# Patient Record
Sex: Female | Born: 1955 | Race: White | Hispanic: No | Smoking: Never smoker
Health system: Southern US, Community
[De-identification: ages and names within clinical notes are randomized; demographics above are authoritative.]

## PROBLEM LIST (undated history)

## (undated) DIAGNOSIS — M25551 Pain in right hip: Secondary | ICD-10-CM

## (undated) DIAGNOSIS — F1010 Alcohol abuse, uncomplicated: Secondary | ICD-10-CM

## (undated) DIAGNOSIS — I639 Cerebral infarction, unspecified: Secondary | ICD-10-CM

## (undated) DIAGNOSIS — M6250 Muscle wasting and atrophy, not elsewhere classified, unspecified site: Secondary | ICD-10-CM

## (undated) DIAGNOSIS — M869 Osteomyelitis, unspecified: Secondary | ICD-10-CM

## (undated) DIAGNOSIS — F0150 Vascular dementia without behavioral disturbance: Secondary | ICD-10-CM

## (undated) DIAGNOSIS — R262 Difficulty in walking, not elsewhere classified: Secondary | ICD-10-CM

## (undated) DIAGNOSIS — I10 Essential (primary) hypertension: Secondary | ICD-10-CM

## (undated) DIAGNOSIS — M069 Rheumatoid arthritis, unspecified: Secondary | ICD-10-CM

## (undated) DIAGNOSIS — L4050 Arthropathic psoriasis, unspecified: Secondary | ICD-10-CM

## (undated) DIAGNOSIS — E785 Hyperlipidemia, unspecified: Secondary | ICD-10-CM

## (undated) DIAGNOSIS — F0390 Unspecified dementia without behavioral disturbance: Secondary | ICD-10-CM

## (undated) DIAGNOSIS — F0280 Dementia in other diseases classified elsewhere without behavioral disturbance: Secondary | ICD-10-CM

## (undated) DIAGNOSIS — G8929 Other chronic pain: Secondary | ICD-10-CM

## (undated) DIAGNOSIS — G309 Alzheimer's disease, unspecified: Secondary | ICD-10-CM

## (undated) DIAGNOSIS — I7300 Raynaud's syndrome without gangrene: Secondary | ICD-10-CM

## (undated) HISTORY — PX: SPINE SURGERY: SHX786

## (undated) HISTORY — PX: JOINT REPLACEMENT: SHX530

---

## 2015-02-15 ENCOUNTER — Ambulatory Visit
Admission: RE | Admit: 2015-02-15 | Discharge: 2015-02-15 | Disposition: A | Discharge status: Home / Self Care | Payer: Medicare Other | Source: Ambulatory Visit | Attending: Nurse Practitioner | Admitting: Nurse Practitioner

## 2015-02-15 ENCOUNTER — Other Ambulatory Visit: Payer: Self-pay | Admitting: Nurse Practitioner

## 2015-02-15 DIAGNOSIS — M25551 Pain in right hip: Secondary | ICD-10-CM

## 2015-02-15 DIAGNOSIS — Z96642 Presence of left artificial hip joint: Secondary | ICD-10-CM | POA: Diagnosis not present

## 2015-02-15 DIAGNOSIS — I739 Peripheral vascular disease, unspecified: Secondary | ICD-10-CM | POA: Diagnosis not present

## 2015-02-15 DIAGNOSIS — M8580 Other specified disorders of bone density and structure, unspecified site: Secondary | ICD-10-CM | POA: Insufficient documentation

## 2015-06-08 ENCOUNTER — Inpatient Hospital Stay
Admission: EM | Admit: 2015-06-08 | Discharge: 2015-06-09 | DRG: 690 | Disposition: A | Discharge status: Home / Self Care | Payer: Commercial Managed Care - HMO | Attending: Internal Medicine | Admitting: Internal Medicine

## 2015-06-08 ENCOUNTER — Emergency Department: Payer: Commercial Managed Care - HMO

## 2015-06-08 ENCOUNTER — Inpatient Hospital Stay
Admit: 2015-06-08 | Discharge: 2015-06-08 | Disposition: A | Discharge status: Home / Self Care | Payer: Commercial Managed Care - HMO | Attending: Internal Medicine | Admitting: Internal Medicine

## 2015-06-08 ENCOUNTER — Encounter: Payer: Self-pay | Admitting: Internal Medicine

## 2015-06-08 ENCOUNTER — Inpatient Hospital Stay: Payer: Commercial Managed Care - HMO

## 2015-06-08 DIAGNOSIS — Z79899 Other long term (current) drug therapy: Secondary | ICD-10-CM

## 2015-06-08 DIAGNOSIS — Z8673 Personal history of transient ischemic attack (TIA), and cerebral infarction without residual deficits: Secondary | ICD-10-CM

## 2015-06-08 DIAGNOSIS — Z881 Allergy status to other antibiotic agents status: Secondary | ICD-10-CM

## 2015-06-08 DIAGNOSIS — E860 Dehydration: Secondary | ICD-10-CM | POA: Diagnosis present

## 2015-06-08 DIAGNOSIS — Z833 Family history of diabetes mellitus: Secondary | ICD-10-CM

## 2015-06-08 DIAGNOSIS — I10 Essential (primary) hypertension: Secondary | ICD-10-CM | POA: Diagnosis present

## 2015-06-08 DIAGNOSIS — N390 Urinary tract infection, site not specified: Secondary | ICD-10-CM | POA: Diagnosis present

## 2015-06-08 DIAGNOSIS — E871 Hypo-osmolality and hyponatremia: Secondary | ICD-10-CM | POA: Diagnosis present

## 2015-06-08 DIAGNOSIS — I639 Cerebral infarction, unspecified: Secondary | ICD-10-CM | POA: Diagnosis present

## 2015-06-08 DIAGNOSIS — F1010 Alcohol abuse, uncomplicated: Secondary | ICD-10-CM | POA: Diagnosis present

## 2015-06-08 DIAGNOSIS — G459 Transient cerebral ischemic attack, unspecified: Secondary | ICD-10-CM

## 2015-06-08 DIAGNOSIS — M069 Rheumatoid arthritis, unspecified: Secondary | ICD-10-CM | POA: Diagnosis present

## 2015-06-08 DIAGNOSIS — F0390 Unspecified dementia without behavioral disturbance: Secondary | ICD-10-CM | POA: Diagnosis present

## 2015-06-08 DIAGNOSIS — Z8249 Family history of ischemic heart disease and other diseases of the circulatory system: Secondary | ICD-10-CM

## 2015-06-08 HISTORY — DX: Unspecified dementia, unspecified severity, without behavioral disturbance, psychotic disturbance, mood disturbance, and anxiety: F03.90

## 2015-06-08 HISTORY — DX: Rheumatoid arthritis, unspecified: M06.9

## 2015-06-08 HISTORY — DX: Essential (primary) hypertension: I10

## 2015-06-08 LAB — URINALYSIS COMPLETE WITH MICROSCOPIC (ARMC ONLY)
Bilirubin Urine: NEGATIVE
Glucose, UA: NEGATIVE mg/dL
HGB URINE DIPSTICK: NEGATIVE
KETONES UR: NEGATIVE mg/dL
Nitrite: NEGATIVE
PH: 5 (ref 5.0–8.0)
PROTEIN: NEGATIVE mg/dL
SPECIFIC GRAVITY, URINE: 1.008 (ref 1.005–1.030)

## 2015-06-08 LAB — COMPREHENSIVE METABOLIC PANEL
ALBUMIN: 3.7 g/dL (ref 3.5–5.0)
ALT: 13 U/L — ABNORMAL LOW (ref 14–54)
AST: 27 U/L (ref 15–41)
Alkaline Phosphatase: 102 U/L (ref 38–126)
Anion gap: 10 (ref 5–15)
BILIRUBIN TOTAL: 0.6 mg/dL (ref 0.3–1.2)
BUN: 23 mg/dL — AB (ref 6–20)
CHLORIDE: 98 mmol/L — AB (ref 101–111)
CO2: 26 mmol/L (ref 22–32)
Calcium: 9.1 mg/dL (ref 8.9–10.3)
Creatinine, Ser: 1.01 mg/dL — ABNORMAL HIGH (ref 0.44–1.00)
GFR calc Af Amer: >60 mL/min (ref >60)
GFR calc non Af Amer: 59 mL/min — ABNORMAL LOW (ref >60)
GLUCOSE: 108 mg/dL — AB (ref 65–99)
POTASSIUM: 4.5 mmol/L (ref 3.5–5.1)
Sodium: 134 mmol/L — ABNORMAL LOW (ref 135–145)
Total Protein: 7.9 g/dL (ref 6.5–8.1)

## 2015-06-08 LAB — CBC WITH DIFFERENTIAL/PLATELET
BASOS ABS: 0.1 10*3/uL (ref 0–0.1)
Eosinophils Absolute: 0 10*3/uL (ref 0–0.7)
Eosinophils Relative: 1 %
HEMATOCRIT: 34.8 % — AB (ref 35.0–47.0)
Hemoglobin: 11.9 g/dL — ABNORMAL LOW (ref 12.0–16.0)
Lymphocytes Relative: 10 %
Lymphs Abs: 0.8 10*3/uL — ABNORMAL LOW (ref 1.0–3.6)
MCH: 30.9 pg (ref 26.0–34.0)
MCHC: 34.3 g/dL (ref 32.0–36.0)
MCV: 90.2 fL (ref 80.0–100.0)
MONO ABS: 0.7 10*3/uL (ref 0.2–0.9)
Monocytes Relative: 8 %
NEUTROS ABS: 6.7 10*3/uL — AB (ref 1.4–6.5)
Platelets: 272 10*3/uL (ref 150–440)
RBC: 3.85 MIL/uL (ref 3.80–5.20)
RDW: 13.1 % (ref 11.5–14.5)
WBC: 8.3 10*3/uL (ref 3.6–11.0)

## 2015-06-08 LAB — PROTIME-INR
INR: 1.11
Prothrombin Time: 14.5 seconds (ref 11.4–15.0)

## 2015-06-08 LAB — APTT: APTT: 32 s (ref 24–36)

## 2015-06-08 LAB — TROPONIN I: Troponin I: <0.03 ng/mL (ref <0.031)

## 2015-06-08 MED ORDER — ENOXAPARIN SODIUM 40 MG/0.4ML ~~LOC~~ SOLN
40.0000 mg | SUBCUTANEOUS | Status: DC
  Administered 2015-06-08: 40 mg via SUBCUTANEOUS
  Filled 2015-06-08: qty 0.4

## 2015-06-08 MED ORDER — LORAZEPAM 1 MG PO TABS: 1.0000 mg | ORAL_TABLET | Freq: Four times a day (QID) | ORAL | Status: DC | PRN

## 2015-06-08 MED ORDER — METOPROLOL TARTRATE 25 MG PO TABS
12.5000 mg | ORAL_TABLET | Freq: Two times a day (BID) | ORAL | Status: DC
  Administered 2015-06-08 – 2015-06-09 (×3): 12.5 mg via ORAL
  Filled 2015-06-08 (×3): qty 1

## 2015-06-08 MED ORDER — OXYCODONE-ACETAMINOPHEN 5-325 MG PO TABS: 1.0000 | ORAL_TABLET | Freq: Every day | ORAL | Status: DC | PRN

## 2015-06-08 MED ORDER — SENNOSIDES-DOCUSATE SODIUM 8.6-50 MG PO TABS: 1.0000 | ORAL_TABLET | Freq: Every evening | ORAL | Status: DC | PRN

## 2015-06-08 MED ORDER — LORAZEPAM 2 MG/ML IJ SOLN: 1.0000 mg | Freq: Four times a day (QID) | INTRAMUSCULAR | Status: DC | PRN

## 2015-06-08 MED ORDER — ASPIRIN 300 MG RE SUPP: 300.0000 mg | Freq: Every day | RECTAL | Status: DC

## 2015-06-08 MED ORDER — DONEPEZIL HCL 5 MG PO TABS
10.0000 mg | ORAL_TABLET | Freq: Every day | ORAL | Status: DC
  Administered 2015-06-08: 10 mg via ORAL
  Filled 2015-06-08: qty 2

## 2015-06-08 MED ORDER — LORAZEPAM 2 MG PO TABS
0.0000 mg | ORAL_TABLET | Freq: Four times a day (QID) | ORAL | Status: DC
  Administered 2015-06-09: 09:00:00 2 mg via ORAL
  Filled 2015-06-08: qty 1

## 2015-06-08 MED ORDER — FOLIC ACID 1 MG PO TABS: 1.0000 mg | ORAL_TABLET | Freq: Every day | ORAL | Status: DC

## 2015-06-08 MED ORDER — VITAMIN B-1 100 MG PO TABS
100.0000 mg | ORAL_TABLET | Freq: Every day | ORAL | Status: DC
Start: 2015-06-08 — End: 2015-06-09
  Administered 2015-06-08 – 2015-06-09 (×2): 100 mg via ORAL
  Filled 2015-06-08 (×2): qty 1

## 2015-06-08 MED ORDER — THIAMINE HCL 100 MG/ML IJ SOLN
100.0000 mg | Freq: Every day | INTRAMUSCULAR | Status: DC
Start: 2015-06-08 — End: 2015-06-09
  Filled 2015-06-08 (×2): qty 1

## 2015-06-08 MED ORDER — CEFTRIAXONE SODIUM 1 G IJ SOLR
1.0000 g | Freq: Once | INTRAMUSCULAR | Status: AC
  Administered 2015-06-08: 1 g via INTRAVENOUS
  Filled 2015-06-08: qty 10

## 2015-06-08 MED ORDER — DEXTROSE 5 % IV SOLN
1.0000 g | INTRAVENOUS | Status: DC
  Filled 2015-06-08: qty 10

## 2015-06-08 MED ORDER — ASPIRIN 325 MG PO TABS
325.0000 mg | ORAL_TABLET | Freq: Every day | ORAL | Status: DC
  Administered 2015-06-09: 325 mg via ORAL
  Filled 2015-06-08: qty 1

## 2015-06-08 MED ORDER — LORAZEPAM 2 MG PO TABS: 0.0000 mg | ORAL_TABLET | Freq: Two times a day (BID) | ORAL | Status: DC

## 2015-06-08 MED ORDER — STROKE: EARLY STAGES OF RECOVERY BOOK
Freq: Once | Status: AC
  Administered 2015-06-08: 17:00:00

## 2015-06-08 MED ORDER — ATORVASTATIN CALCIUM 20 MG PO TABS
40.0000 mg | ORAL_TABLET | Freq: Every day | ORAL | Status: DC
  Administered 2015-06-08: 40 mg via ORAL
  Filled 2015-06-08: qty 2

## 2015-06-08 MED ORDER — SODIUM CHLORIDE 0.9 % IV SOLN
INTRAVENOUS | Status: DC
  Administered 2015-06-08: 18:00:00 via INTRAVENOUS

## 2015-06-08 MED ORDER — ADULT MULTIVITAMIN W/MINERALS CH
1.0000 | ORAL_TABLET | Freq: Every day | ORAL | Status: DC
  Administered 2015-06-08 – 2015-06-09 (×2): 1 via ORAL
  Filled 2015-06-08 (×2): qty 1

## 2015-06-08 MED ORDER — FOLIC ACID 1 MG PO TABS
1.0000 mg | ORAL_TABLET | Freq: Every day | ORAL | Status: DC
  Administered 2015-06-08 – 2015-06-09 (×2): 1 mg via ORAL
  Filled 2015-06-08 (×2): qty 1

## 2015-06-08 MED ORDER — ASPIRIN 81 MG PO CHEW
324.0000 mg | CHEWABLE_TABLET | Freq: Once | ORAL | Status: AC
  Administered 2015-06-08: 324 mg via ORAL
  Filled 2015-06-08: qty 4

## 2015-06-08 NOTE — ED Provider Notes (Signed)
[facility] Emergency Department Provider Note   ____________________________________________  Time seen: Approximately 12:43 PM  I have reviewed the triage vital signs and the nursing notes.   HISTORY  Chief Complaint Weakness    HPI Ruth Price is a 60 y.o. female history of psoriatic arthritis, history of hypertension, CVA, prior head injury who presents for evaluation of transient confusion with word finding difficulty today, gradual onset, now resolved, no modifying factors. The patient reports that she was doing some work with her checkbook just prior to arrival when suddenly she became unable to perform additional or subtraction, she also had word finding difficulty. This lasted approximately 7 minutes before it resolved. No chest pain difficulty breathing, no abdominal pain, no vomiting, diarrhea, fevers or chills but she denies any associated numbness or weakness. She denies any dysuria, hematuria, or increased urinary frequent.   Past Medical History  Diagnosis Date  . RA (rheumatoid arthritis) (HCC)   . Hypertension   . Dementia     Patient Active Problem List   Diagnosis Date Noted  . CVA (cerebral infarction) 06/08/2015    Past Surgical History  Procedure Laterality Date  . Joint replacement    . Spine surgery      Current Outpatient Rx  Name  Route  Sig  Dispense  Refill  . atorvastatin (LIPITOR) 40 MG tablet   Oral   Take 40 mg by mouth at bedtime.         . B Complex-C (SUPER B COMPLEX PO)   Oral   Take 1 capsule by mouth daily.         . Biotin 1000 MCG tablet   Oral   Take 1,000 mcg by mouth daily.         . calcium carbonate (OSCAL) 1500 (600 Ca) MG TABS tablet   Oral   Take 600 mg of elemental calcium by mouth daily with breakfast.         . cyclobenzaprine (FLEXERIL) 10 MG tablet   Oral   Take 10 mg by mouth 3 (three) times daily as needed for muscle spasms.         Marland Kitchen donepezil (ARICEPT) 10 MG  tablet   Oral   Take 10 mg by mouth at bedtime.         . folic acid (FOLVITE) 1 MG tablet   Oral   Take 1 mg by mouth daily.         . Glucosamine-Chondroitin-MSM 750-400-375 MG TABS   Oral   Take 1 tablet by mouth daily.         . Multiple Vitamin (MULTIVITAMIN WITH MINERALS) TABS tablet   Oral   Take 1 tablet by mouth daily.         . naproxen sodium (ANAPROX) 220 MG tablet   Oral   Take 440 mg by mouth daily with breakfast.         . omega-3 acid ethyl esters (LOVAZA) 1 g capsule   Oral   Take 2 g by mouth daily.         Marland Kitchen oxyCODONE-acetaminophen (PERCOCET/ROXICET) 5-325 MG tablet   Oral   Take 1 tablet by mouth daily as needed for severe pain.           Allergies Levofloxacin  Family History  Problem Relation Age of Onset  . Diabetes Mother   . Heart disease Father     Social History Social History  Substance Use Topics  . Smoking  status: Not on file  . Smokeless tobacco: Not on file  . Alcohol Use: Not on file    Review of Systems Constitutional: No fever/chills Eyes: No visual changes. ENT: No sore throat. Cardiovascular: Denies chest pain. Respiratory: Denies shortness of breath. Gastrointestinal: No abdominal pain.  No nausea, no vomiting.  No diarrhea.  No constipation. Genitourinary: Negative for dysuria. Musculoskeletal: Negative for back pain. Skin: Negative for rash. Neurological: Negative for headaches, focal weakness or numbness.  10-point ROS otherwise negative.  ____________________________________________   PHYSICAL EXAM:  Filed Vitals:   06/08/15 1246 06/08/15 1500  BP: 186/68   Pulse: 79   Temp:  98.3 F (36.8 C)  TempSrc:  Oral  Resp: 18   Weight: 101 lb 11.2 oz (46.131 kg)   SpO2: 95%      Constitutional: Alert and oriented. Well appearing and in no acute distress. Eyes: Conjunctivae are normal. PERRL. EOMI. Head: Atraumatic. Nose: No congestion/rhinnorhea. Mouth/Throat: Mucous membranes are moist.   Oropharynx non-erythematous. Neck: No stridor. Supple without meningismus. Cardiovascular: Normal rate, regular rhythm. Grossly normal heart sounds.  Good peripheral circulation. Respiratory: Normal respiratory effort.  No retractions. Lungs CTAB. Gastrointestinal: Soft and nontender. No distention.  No CVA tenderness. Genitourinary: deferred Musculoskeletal: No lower extremity tenderness nor edema.  No joint effusions. Chronic contractures with muscle wasting and deformities of the hand secondary to psoriatic arthritis. Neurologic:  Normal speech and language. No gross focal neurologic deficits are appreciated. 5 out of 5 strength bilateral upper and lower extremity, sensation intact to light touch throughout, cranial nerves II through XII intact. Skin:  Skin is warm, dry and intact. No rash noted. Psychiatric: Mood and affect are normal. Speech and behavior are normal.  ____________________________________________   LABS (all labs ordered are listed, but only abnormal results are displayed)  Labs Reviewed  CBC WITH DIFFERENTIAL/PLATELET - Abnormal; Notable for the following:    Hemoglobin 11.9 (*)    HCT 34.8 (*)    Neutro Abs 6.7 (*)    Lymphs Abs 0.8 (*)    All other components within normal limits  COMPREHENSIVE METABOLIC PANEL - Abnormal; Notable for the following:    Sodium 134 (*)    Chloride 98 (*)    Glucose, Bld 108 (*)    BUN 23 (*)    Creatinine, Ser 1.01 (*)    ALT 13 (*)    GFR calc non Af Amer 59 (*)    All other components within normal limits  URINALYSIS COMPLETEWITH MICROSCOPIC ([facility] ONLY) - Abnormal; Notable for the following:    Color, Urine YELLOW (*)    APPearance CLEAR (*)    Leukocytes, UA 2+ (*)    Bacteria, UA RARE (*)    Squamous Epithelial / LPF 6-30 (*)    All other components within normal limits  CULTURE, BLOOD (ROUTINE X 2)  CULTURE, BLOOD (ROUTINE X 2)  URINE CULTURE  TROPONIN I  PROTIME-INR  APTT    ____________________________________________  EKG  ED ECG REPORT I, Gayla Doss, the attending physician, personally viewed and interpreted this ECG.   Date: 06/08/2015  EKG Time: 13:37  Rate: 68  Rhythm: normal sinus rhythm  Axis: normal  Intervals:none  ST&T Change: No acute ST elevation, RSR prime likely normal variant.  ____________________________________________  RADIOLOGY  CT head IMPRESSION: Subacute to chronic right posterior parietal and occipital infarct. Favor chronic.  Chronic small vessel disease throughout the deep white matter.  CXR  IMPRESSION: No active cardiopulmonary disease.  ____________________________________________  PROCEDURES  Procedure(s) performed: None  Critical Care performed: No  ____________________________________________   INITIAL IMPRESSION / ASSESSMENT AND PLAN / ED COURSE  Pertinent labs & imaging results that were available during my care of the patient were reviewed by me and considered in my medical decision making (see chart for details).  Ruth Price is a 60 y.o. female history of psoriatic arthritis, history of hypertension, CVA, prior head injury who presents for evaluation of transient confusion with word finding difficulty today, gradual onset, now resolved. On exam, she is well-appearing and in no acute distress. Her vital signs are stable, she is afebrile. Currently, she has an intact neurological examination. NIH stroke scale of 0, not a candidate for TPA. My concern is for possible TIA. We'll obtain screening labs, CT head, chest x-ray, urinalysis and anticipate admission.  ----------------------------------------- 2:26 PM on 06/08/2015 ----------------------------------------- Labs reviewed. CBC and CMP are generally unremarkable. Negative troponin. CT head shows subacute to chronic infarcts. Chest x-ray clear. Urinalysis is concerning for urinary tract infection, we'll give ceftriaxone. Suspect this  may have represented a TIA versus transient confusion secondary to urinary tract infection. Aspirin ordered.Case discussed with hospitalist, Dr. Claudie Fisherman, for admission ____________________________________________   FINAL CLINICAL IMPRESSION(S) / ED DIAGNOSES  Final diagnoses:  Transient cerebral ischemia, unspecified transient cerebral ischemia type  UTI (lower urinary tract infection)      NEW MEDICATIONS STARTED DURING THIS VISIT:  New Prescriptions   No medications on file     Note:  This document was prepared using Dragon voice recognition software and may include unintentional dictation errors.    Gayla Doss, MD 06/08/15 [phone]

## 2015-06-08 NOTE — ED Notes (Signed)
Patient transported to MRI 

## 2015-06-08 NOTE — Progress Notes (Signed)
*  PRELIMINARY RESULTS* Echocardiogram 2D Echocardiogram has been performed.  Garrel Ridgel Stills 06/08/2015, 7:10 PM

## 2015-06-08 NOTE — H&P (Signed)
[facility] - [place] [facility]   PATIENT NAME: Ruth Price    MR#:  [id number]  DATE OF BIRTH:  07/30/1955  DATE OF ADMISSION:  06/08/2015  PRIMARY CARE PHYSICIAN: Jonathon Bellows, NP   REQUESTING/REFERRING PHYSICIAN: Gayla Doss, MD  CHIEF COMPLAINT:   Chief Complaint  Patient presents with  . Weakness   Confusion with word finding difficulty today. HISTORY OF PRESENT ILLNESS:  Ruth Price  is a 60 y.o. female with a known history of hypertension, RA and dementia. The patient was doing some work with her checkbook just prior to arrival. Patient was unable to talk even she could listen. The patient denies any focal weakness or numbness. She denies any other symptoms. Her CAT scan of the head showed subtle acute CVA or chronic. She was treated with aspirin in the ED.  PAST MEDICAL HISTORY:   Past Medical History  Diagnosis Date  . RA (rheumatoid arthritis) (HCC)   . Hypertension   . Dementia     PAST SURGICAL HISTORY:   Past Surgical History  Procedure Laterality Date  . Joint replacement    . Spine surgery      SOCIAL HISTORY:   Social History  Substance Use Topics  . Smoking status: Never Smoker   . Smokeless tobacco: Not on file  . Alcohol Use: No    FAMILY HISTORY:   Family History  Problem Relation Age of Onset  . Diabetes Mother   . Heart disease Father     DRUG ALLERGIES:   Allergies  Allergen Reactions  . Levofloxacin Other (See Comments)    Reaction:  Unknown     REVIEW OF SYSTEMS:  CONSTITUTIONAL: No fever, fatigue or weakness.  EYES: No blurred or double vision.  EARS, NOSE, AND THROAT: No tinnitus or ear pain.  RESPIRATORY: No cough, shortness of breath, wheezing or hemoptysis.  CARDIOVASCULAR: No chest pain, orthopnea, edema.  GASTROINTESTINAL: No nausea, vomiting, diarrhea or abdominal pain.  GENITOURINARY: No dysuria, hematuria.  ENDOCRINE: No polyuria, nocturia,  HEMATOLOGY: No anemia, easy  bruising or bleeding SKIN: No rash or lesion. MUSCULOSKELETAL: No joint pain or arthritis.   NEUROLOGIC: No tingling, numbness, weakness. Difficulty finding words. PSYCHIATRY: No anxiety or depression.   MEDICATIONS AT HOME:   Prior to Admission medications   Medication Sig Start Date End Date Taking? Authorizing Provider  atorvastatin (LIPITOR) 40 MG tablet Take 40 mg by mouth at bedtime.   Yes Historical Provider, MD  B Complex-C (SUPER B COMPLEX PO) Take 1 capsule by mouth daily.   Yes Historical Provider, MD  Biotin 1000 MCG tablet Take 1,000 mcg by mouth daily.   Yes Historical Provider, MD  calcium carbonate (OSCAL) 1500 (600 Ca) MG TABS tablet Take 600 mg of elemental calcium by mouth daily with breakfast.   Yes Historical Provider, MD  cyclobenzaprine (FLEXERIL) 10 MG tablet Take 10 mg by mouth 3 (three) times daily as needed for muscle spasms.   Yes Historical Provider, MD  donepezil (ARICEPT) 10 MG tablet Take 10 mg by mouth at bedtime.   Yes Historical Provider, MD  folic acid (FOLVITE) 1 MG tablet Take 1 mg by mouth daily.   Yes Historical Provider, MD  Glucosamine-Chondroitin-MSM [phone] MG TABS Take 1 tablet by mouth daily.   Yes Historical Provider, MD  Multiple Vitamin (MULTIVITAMIN WITH MINERALS) TABS tablet Take 1 tablet by mouth daily.   Yes Historical Provider, MD  naproxen sodium (ANAPROX) 220 MG tablet Take 440 mg  by mouth daily with breakfast.   Yes Historical Provider, MD  omega-3 acid ethyl esters (LOVAZA) 1 g capsule Take 2 g by mouth daily.   Yes Historical Provider, MD  oxyCODONE-acetaminophen (PERCOCET/ROXICET) 5-325 MG tablet Take 1 tablet by mouth daily as needed for severe pain.   Yes Historical Provider, MD      VITAL SIGNS:  Blood pressure 186/68, pulse 79, temperature 98.3 F (36.8 C), temperature source Oral, resp. rate 18, weight 46.131 kg (101 lb 11.2 oz), SpO2 95 %.  PHYSICAL EXAMINATION:  GENERAL:  60 y.o.-year-old patient lying in the bed  with no acute distress.  EYES: Pupils equal, round, reactive to light and accommodation. No scleral icterus. Extraocular muscles intact.  HEENT: Head atraumatic, normocephalic. Oropharynx and nasopharynx clear. Hard hearing. NECK:  Supple, no jugular venous distention. No thyroid enlargement, no tenderness.  LUNGS: Normal breath sounds bilaterally, no wheezing, rales,rhonchi or crepitation. No use of accessory muscles of respiration.  CARDIOVASCULAR: S1, S2 normal. No murmurs, rubs, or gallops.  ABDOMEN: Soft, nontender, nondistended. Bowel sounds present. No organomegaly or mass.  EXTREMITIES: No pedal edema, cyanosis, or clubbing.  NEUROLOGIC: Cranial nerves II through XII are intact. Muscle strength 5/5 in all extremities. Sensation intact. Gait not checked.  PSYCHIATRIC: The patient is alert and oriented x 3.  SKIN: No obvious rash, lesion, or ulcer.   LABORATORY PANEL:   CBC  Recent Labs Lab 06/08/15 1249  WBC 8.3  HGB 11.9*  HCT 34.8*  PLT 272   ------------------------------------------------------------------------------------------------------------------  Chemistries   Recent Labs Lab 06/08/15 1249  NA 134*  K 4.5  CL 98*  CO2 26  GLUCOSE 108*  BUN 23*  CREATININE 1.01*  CALCIUM 9.1  AST 27  ALT 13*  ALKPHOS 102  BILITOT 0.6   ------------------------------------------------------------------------------------------------------------------  Cardiac Enzymes  Recent Labs Lab 06/08/15 1249  TROPONINI <0.03   ------------------------------------------------------------------------------------------------------------------  RADIOLOGY:  Dg Chest 2 View  06/08/2015  CLINICAL DATA:  Patient with possible syncopal episode. EXAM: CHEST  2 VIEW COMPARISON:  None. FINDINGS: Normal cardiac and mediastinal contours. No consolidative pulmonary opacities. No pleural effusion or pneumothorax. Anterior cervical spinal fusion hardware. Bilateral shoulder joint  degenerative changes. Thoracic spine degenerative changes. Lateral view limited due to overlapping soft tissue. Apical pleural parenchymal thickening. IMPRESSION: No active cardiopulmonary disease. Electronically Signed   By: Annia Belt M.D.   On: 06/08/2015 13:45   Ct Head Wo Contrast  06/08/2015  CLINICAL DATA:  Possible TIA.  Altered mental status. EXAM: CT HEAD WITHOUT CONTRAST TECHNIQUE: Contiguous axial images were obtained from the base of the skull through the vertex without intravenous contrast. COMPARISON:  None. FINDINGS: Chronic small vessel disease changes throughout the deep white matter. Subacute or old infarcts noted in the right posterior parietal/occipital lobe. No hemorrhage or hydrocephalus. No acute calvarial abnormality. Visualized paranasal sinuses and mastoids clear. Orbital soft tissues unremarkable. 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 Favor chronic. Chronic small vessel disease throughout the deep white matter. Electronically Signed   By: Charlett Nose M.D.   On: 06/08/2015 13:20    EKG:   Orders placed or performed during the hospital encounter of 06/08/15  . ED EKG  . ED EKG    IMPRESSION AND PLAN:   Subacute CVA The patient will be admitted to medical floor. Follow up MRI of the brain, echocardiogram and carotid duplex. Start aspirin and Lipitor. Neuro check, PT, OT and speech study. Fall and aspiration precaution.  Hypertension. Start low-dose  Lopressor, but allow permissive blood pressure.  Dehydration. Start normal saline and follow-up BMP. Mild hyponatremia. Continue normal saline IV and follow-up BMP.   All the records are reviewed and case discussed with ED provider. Management plans discussed with the patient, family and they are in agreement.  CODE STATUS: Full code  TOTAL TIME TAKING CARE OF THIS PATIENT: 52 minutes.    Shaune Pollack M.D on 06/08/2015 at 3:07 PM  Between 7am to 6pm - Pager -  [phone]  After 6pm go to [email or web address] - password [email or web address] [facility]  [facility]  Office  [phone]  CC: Primary care physician; Jonathon Bellows, NP

## 2015-06-08 NOTE — ED Notes (Signed)
Per patient daughter in law they were at home and patient had a period that last 6-7 minutes where her eyes appeared glazed over, patient was talking but not making sense, and patient would not response when daughter in law called her name.  Per daughter in law patient drinks daily, thinks her last drink was last PM. Patient was also diagnosed with Dementia about 7 years ago but will not admit to it.

## 2015-06-08 NOTE — ED Notes (Signed)
Patient from home via [facility]. Patient states she had a feeling of "being in a balloon". Patient's family states she was non-responsive. However, patient is at baseline at current. Stroke scale negative.

## 2015-06-08 NOTE — ED Notes (Signed)
Contact information for her daughter in [place]:   [phone]

## 2015-06-08 NOTE — ED Notes (Signed)
Attempted to call Patient daughter in law to give update on patient. Left message for Amber to call back.

## 2015-06-08 NOTE — ED Notes (Signed)
Patient states that she and her daughter in law were working on her check book and patient was unable to do addition and subtraction. Patient states that she then felt as if she was unable to express her thoughts and she felt as if she were in a bubble. Patient denies LOC during this time and states that she could hear her daughter in law talking to her she was just unable to answer her.

## 2015-06-09 ENCOUNTER — Inpatient Hospital Stay: Payer: Commercial Managed Care - HMO

## 2015-06-09 LAB — BASIC METABOLIC PANEL
Anion gap: 10 (ref 5–15)
BUN: 17 mg/dL (ref 6–20)
CALCIUM: 8.7 mg/dL — AB (ref 8.9–10.3)
CO2: 25 mmol/L (ref 22–32)
CREATININE: 0.86 mg/dL (ref 0.44–1.00)
Chloride: 99 mmol/L — ABNORMAL LOW (ref 101–111)
GFR calc non Af Amer: >60 mL/min (ref >60)
Glucose, Bld: 98 mg/dL (ref 65–99)
Potassium: 3.6 mmol/L (ref 3.5–5.1)
SODIUM: 134 mmol/L — AB (ref 135–145)

## 2015-06-09 LAB — LIPID PANEL
CHOL/HDL RATIO: 3.6 ratio
Cholesterol: 118 mg/dL (ref 0–200)
HDL: 33 mg/dL — AB (ref >40)
LDL Cholesterol: 59 mg/dL (ref 0–99)
Triglycerides: 128 mg/dL (ref <150)
VLDL: 26 mg/dL (ref 0–40)

## 2015-06-09 LAB — HEMOGLOBIN A1C: Hgb A1c MFr Bld: 5.3 % (ref 4.0–6.0)

## 2015-06-09 LAB — ECHOCARDIOGRAM COMPLETE: Weight: 1627.2 oz

## 2015-06-09 MED ORDER — METOPROLOL TARTRATE 25 MG PO TABS: 25.0000 mg | ORAL_TABLET | Freq: Two times a day (BID) | ORAL | Status: DC

## 2015-06-09 MED ORDER — ASPIRIN 81 MG PO CHEW: 81.0000 mg | CHEWABLE_TABLET | Freq: Every day | ORAL | Status: DC

## 2015-06-09 MED ORDER — CEPHALEXIN 500 MG PO CAPS: 500.0000 mg | ORAL_CAPSULE | Freq: Two times a day (BID) | ORAL | Status: DC

## 2015-06-09 NOTE — Progress Notes (Signed)
OT Cancellation Note  Patient Details Name: Ruth Price MRN: [id number] DOB: [birth year]   Cancelled Treatment:     Pt eating lunch - family present - pt do not want therapy at home - she gets assistance for bathing and dressing  Family going to replace her shower chair- she has BSC over toilet  Does simple cooking and family helps  No OT eval done  Oletta Cohn 06/09/2015, 1:13 PM

## 2015-06-09 NOTE — Progress Notes (Signed)
Pt being discharged today, discharge paperwork explained and pt received printed prescriptions. She verified understanding of all instructions. Belongings returned to patient. She is waiting on her ride.

## 2015-06-09 NOTE — Discharge Summary (Signed)
[facility] - [place] at [facility]   PATIENT NAME: Ruth Price    MR#:  [id number]  DATE OF BIRTH:  12/10/1955  DATE OF ADMISSION:  06/08/2015 ADMITTING PHYSICIAN: Shaune Pollack, MD  DATE OF DISCHARGE: 06/09/2015  1:35 PM  PRIMARY CARE PHYSICIAN: Apolonio Schneiders C, NP    ADMISSION DIAGNOSIS:  UTI (lower urinary tract infection) [N39.0] Stroke (cerebrum) (HCC) [I63.9] Transient cerebral ischemia, unspecified transient cerebral ischemia type [G45.9]   DISCHARGE DIAGNOSIS:    SECONDARY DIAGNOSIS:   Past Medical History  Diagnosis Date  . RA (rheumatoid arthritis) (HCC)   . Hypertension   . Dementia     HOSPITAL COURSE:   Word finding difficulty, no acute CVA. Per MRI of the brain, echocardiogram; EF 60%, No cardiac source of emboli was indentified. carotid duplex: No significant carotid stenosis. Started aspirin and Lipitor. PT: no home PT.   UTI. The patient was treated with Rocephin, urine culture showed  GNR, changed to by mouth Keflex.  Hypertension. Started low-dose Lopressor. BP is not well controlled, increase to 25 mg bid.  Dehydration. Improved with normal saline iv. Mild hyponatremia. Treated with normal saline IV.  Alcohol abuse. The patient was advised alcohol cessation gradually.  DISCHARGE CONDITIONS:   Stable, discharged to home today.  CONSULTS OBTAINED:     DRUG ALLERGIES:   Allergies  Allergen Reactions  . Levofloxacin Other (See Comments)    Reaction:  Unknown     DISCHARGE MEDICATIONS:   Discharge Medication List as of 06/09/2015 11:38 AM    START taking these medications   Details  aspirin 81 MG chewable tablet Chew 1 tablet (81 mg total) by mouth daily., Starting 06/09/2015, Until Discontinued, Print    cephALEXin (KEFLEX) 500 MG capsule Take 1 capsule (500 mg total) by mouth 2 (two) times daily., Starting 06/09/2015, Until Discontinued, Print    metoprolol tartrate (LOPRESSOR) 25 MG tablet Take 1 tablet (25 mg  total) by mouth 2 (two) times daily., Starting 06/09/2015, Until Discontinued, Print      CONTINUE these medications which have NOT CHANGED   Details  atorvastatin (LIPITOR) 40 MG tablet Take 40 mg by mouth at bedtime., Until Discontinued, Historical Med    B Complex-C (SUPER B COMPLEX PO) Take 1 capsule by mouth daily., Until Discontinued, Historical Med    Biotin 1000 MCG tablet Take 1,000 mcg by mouth daily., Until Discontinued, Historical Med    calcium carbonate (OSCAL) 1500 (600 Ca) MG TABS tablet Take 600 mg of elemental calcium by mouth daily with breakfast., Until Discontinued, Historical Med    donepezil (ARICEPT) 10 MG tablet Take 10 mg by mouth at bedtime., Until Discontinued, Historical Med    folic acid (FOLVITE) 1 MG tablet Take 1 mg by mouth daily., Until Discontinued, Historical Med    Glucosamine-Chondroitin-MSM 750-400-375 MG TABS Take 1 tablet by mouth daily., Until Discontinued, Historical Med    Multiple Vitamin (MULTIVITAMIN WITH MINERALS) TABS tablet Take 1 tablet by mouth daily., Until Discontinued, Historical Med    naproxen sodium (ANAPROX) 220 MG tablet Take 440 mg by mouth daily with breakfast., Until Discontinued, Historical Med    omega-3 acid ethyl esters (LOVAZA) 1 g capsule Take 2 g by mouth daily., Until Discontinued, Historical Med    oxyCODONE-acetaminophen (PERCOCET/ROXICET) 5-325 MG tablet Take 1 tablet by mouth daily as needed for severe pain., Until Discontinued, Historical Med         DISCHARGE INSTRUCTIONS:   If you experience worsening of your admission  symptoms, develop shortness of breath, life threatening emergency, suicidal or homicidal thoughts you must seek medical attention immediately by calling 911 or calling your MD immediately  if symptoms less severe.  You Must read complete instructions/literature along with all the possible adverse reactions/side effects for all the Medicines you take and that have been prescribed to you. Take  any new Medicines after you have completely understood and accept all the possible adverse reactions/side effects.   Please note  You were cared for by a hospitalist during your hospital stay. If you have any questions about your discharge medications or the care you received while you were in the hospital after you are discharged, you can call the unit and asked to speak with the hospitalist on call if the hospitalist that took care of you is not available. Once you are discharged, your primary care physician will handle any further medical issues. Please note that NO REFILLS for any discharge medications will be authorized once you are discharged, as it is imperative that you return to your primary care physician (or establish a relationship with a primary care physician if you do not have one) for your aftercare needs so that they can reassess your need for medications and monitor your lab values.    Today   SUBJECTIVE   No complaint.   VITAL SIGNS:  Blood pressure 171/81, pulse 70, temperature 98.3 F (36.8 C), temperature source Oral, resp. rate 18, height 5\' 2"  (1.575 m), weight 46.131 kg (101 lb 11.2 oz), SpO2 98 %.  I/O:  No intake or output data in the 24 hours ending 06/09/15 1532  PHYSICAL EXAMINATION:  GENERAL:  60 y.o.-year-old patient lying in the bed with no acute distress.  EYES: Pupils equal, round, reactive to light and accommodation. No scleral icterus. Extraocular muscles intact.  HEENT: Head atraumatic, normocephalic. Oropharynx and nasopharynx clear. Heard hearing. NECK:  Supple, no jugular venous distention. No thyroid enlargement, no tenderness.  LUNGS: Normal breath sounds bilaterally, no wheezing, rales,rhonchi or crepitation. No use of accessory muscles of respiration.  CARDIOVASCULAR: S1, S2 normal. No murmurs, rubs, or gallops.  ABDOMEN: Soft, non-tender, non-distended. Bowel sounds present. No organomegaly or mass.  EXTREMITIES: No pedal edema, cyanosis, or  clubbing.  NEUROLOGIC: Cranial nerves II through XII are intact. Muscle strength 5/5 in all extremities. Sensation intact. Gait not checked.  PSYCHIATRIC: The patient is alert and oriented x 3.  SKIN: No obvious rash, lesion, or ulcer.   DATA REVIEW:   CBC  Recent Labs Lab 06/08/15 1249  WBC 8.3  HGB 11.9*  HCT 34.8*  PLT 272    Chemistries   Recent Labs Lab 06/08/15 1249 06/09/15 0422  NA 134* 134*  K 4.5 3.6  CL 98* 99*  CO2 26 25  GLUCOSE 108* 98  BUN 23* 17  CREATININE 1.01* 0.86  CALCIUM 9.1 8.7*  AST 27  --   ALT 13*  --   ALKPHOS 102  --   BILITOT 0.6  --     Cardiac Enzymes  Recent Labs Lab 06/08/15 1249  TROPONINI <0.03    Microbiology Results  Results for orders placed or performed during the hospital encounter of 06/08/15  Urine culture     Status: Abnormal (Preliminary result)   Collection Time: 06/08/15  1:38 PM  Result Value Ref Range Status   Specimen Description URINE, RANDOM  Final   Special Requests NONE  Final   Culture (A)  Final    >=100,000 COLONIES/mL GRAM NEGATIVE  RODS IDENTIFICATION AND SUSCEPTIBILITIES TO FOLLOW    Report Status PENDING  Incomplete  Blood culture (routine x 2)     Status: None (Preliminary result)   Collection Time: 06/08/15  2:42 PM  Result Value Ref Range Status   Specimen Description BLOOD RIGHT ANTECUBITAL  Final   Special Requests   Final    BOTTLES DRAWN AEROBIC AND ANAEROBIC  AER 3CC ANA 1CC   Culture NO GROWTH < 24 HOURS  Final   Report Status PENDING  Incomplete  Blood culture (routine x 2)     Status: None (Preliminary result)   Collection Time: 06/08/15  2:42 PM  Result Value Ref Range Status   Specimen Description BLOOD RIGHT ARM  Final   Special Requests   Final    BOTTLES DRAWN AEROBIC AND ANAEROBIC  AER 3CC ANA 1CC   Culture NO GROWTH < 24 HOURS  Final   Report Status PENDING  Incomplete    RADIOLOGY:  Dg Chest 2 View  06/08/2015  CLINICAL DATA:  Patient with possible syncopal  episode. EXAM: CHEST  2 VIEW COMPARISON:  None. FINDINGS: Normal cardiac and mediastinal contours. No consolidative pulmonary opacities. No pleural effusion or pneumothorax. Anterior cervical spinal fusion hardware. Bilateral shoulder joint degenerative changes. Thoracic spine degenerative changes. Lateral view limited due to overlapping soft tissue. Apical pleural parenchymal thickening. IMPRESSION: No active cardiopulmonary disease. Electronically Signed   By: Annia Belt M.D.   On: 06/08/2015 13:45   Ct Head Wo Contrast  06/08/2015  CLINICAL DATA:  Possible TIA.  Altered mental status. EXAM: CT HEAD WITHOUT CONTRAST TECHNIQUE: Contiguous axial images were obtained from the base of the skull through the vertex without intravenous contrast. COMPARISON:  None. FINDINGS: Chronic small vessel disease changes throughout the deep white matter. Subacute or old infarcts noted in the right posterior parietal/occipital lobe. No hemorrhage or hydrocephalus. No acute calvarial abnormality. Visualized paranasal sinuses and mastoids clear. Orbital soft tissues unremarkable. 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 Favor chronic. Chronic small vessel disease throughout the deep white matter. Electronically Signed   By: Charlett Nose M.D.   On: 06/08/2015 13:20   Mr Brain Wo Contrast  06/08/2015  CLINICAL DATA:  60 year old hypertensive female with rheumatoid arthritis and dementia with episode of difficulty speaking. Subsequent encounter. EXAM: MRI HEAD WITHOUT CONTRAST MRA HEAD WITHOUT CONTRAST TECHNIQUE: Multiplanar, multiecho pulse sequences of the brain and surrounding structures were obtained without intravenous contrast. Angiographic images of the head were obtained using MRA technique without contrast. COMPARISON:  06/08/2015 CT.  No comparison MR. FINDINGS: MRI HEAD FINDINGS No acute infarct. Multiple remote bilateral cerebellar infarcts. Remote right parietal-occipital  temporal lobe infarct. Remote left parietal lobe infarct. Remote bilateral centrum semi ovale/corona radiata infarcts. Remote basal ganglia infarcts. Moderate chronic microvascular disease. Scattered tiny blood breakdown products most notable left temporal region most likely related to prior episodes hemorrhagic ischemia. Global atrophy without hydrocephalus. No intracranial mass lesion noted on this unenhanced exam. Orbital structures unremarkable. Erosion of the dens with cranial settling and transverse ligament prominence consistent with patient's history of rheumatoid arthritis. Prior surgery upper cervical spine. Spinal stenosis most prominent C5-6 level. Orbital structures unremarkable. Minimal partial opacification mastoid air cells and ethmoid sinus air cells. 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 Right carotid artery mildly ectatic and irregular without high-grade stenosis. Mild irregularity and narrowing left carotid artery without high-grade stenosis. Mild irregularity M1 segment middle cerebral artery bilaterally  without high-grade stenosis. Middle cerebral artery M2/ 3 moderate to marked branch vessel irregularity bilaterally. 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 Mild to moderate narrowing distal vertebral artery bilaterally. Nonvisualized right posterior inferior cerebellar artery and left anterior inferior cerebellar artery. Only proximal aspect of the right anterior inferior cerebellar artery is visualized. Mild moderate narrowing portions of the left posterior inferior cerebellar artery. Moderate to marked narrowing distal branches posterior cerebral artery bilaterally. IMPRESSION: MRI HEAD No acute infarct. Multiple remote bilateral cerebellar infarcts. Remote right parietal-occipital temporal lobe infarct. Remote left parietal lobe infarct. Remote bilateral centrum semi ovale/corona radiata infarcts. Remote basal ganglia infarcts.  Moderate chronic microvascular disease. Scattered tiny blood breakdown products most notable left temporal region most likely related to prior episodes hemorrhagic ischemia. Global atrophy without hydrocephalus. Erosion of the dens with cranial settling and transverse ligament prominence consistent with patient's history of rheumatoid arthritis. Prior surgery upper cervical spine. Spinal stenosis most prominent C5-6 level. MRA HEAD Right carotid artery mildly ectatic and irregular without high-grade stenosis. Mild irregularity and narrowing of the left internal carotid artery without high-grade stenosis. Mild irregularity M1 segment middle cerebral artery bilaterally without high-grade stenosis. Middle cerebral artery M2/ M3 moderate to marked branch vessel irregularity bilaterally. 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 Mild to moderate narrowing distal vertebral artery bilaterally. Nonvisualized right posterior inferior cerebellar artery and left anterior inferior cerebellar artery. Only proximal aspect of the right anterior inferior cerebellar artery is visualized. Mild to moderate narrowing portions of the left posterior inferior cerebellar artery. Moderate to marked narrowing distal branches posterior cerebral artery bilaterally. Electronically Signed   By: Lacy Duverney M.D.   On: 06/08/2015 16:30   US Carotid Bilateral  06/09/2015  CLINICAL DATA:  History of stroke. Hypertension, visual disturbance and hyperlipidemia. EXAM: BILATERAL CAROTID DUPLEX ULTRASOUND TECHNIQUE: Wallace Cullens scale imaging, color Doppler and duplex ultrasound were performed of bilateral carotid and vertebral arteries in the neck. COMPARISON:  None. FINDINGS: Criteria: Quantification of carotid stenosis is based on velocity parameters that correlate the residual internal carotid diameter with NASCET-based stenosis levels, using the diameter of the distal internal carotid lumen as the denominator for stenosis measurement.  The following velocity measurements were obtained: RIGHT ICA:  78/18 cm/sec CCA:  74/12 cm/sec SYSTOLIC ICA/CCA RATIO:  1.1 DIASTOLIC ICA/CCA RATIO:  1.5 ECA:  113 cm/sec LEFT ICA:  64/9 cm/sec CCA:  103/12 cm/sec SYSTOLIC ICA/CCA RATIO:  0.6 DIASTOLIC ICA/CCA RATIO:  0.7 ECA:  82 cm/sec RIGHT CAROTID ARTERY: Mild amount of calcified plaque is present at the level of the carotid bulb and proximal internal and external carotid arteries. Velocities and waveforms are normal. Estimated right ICA stenosis is less than 50%. RIGHT VERTEBRAL ARTERY: Antegrade flow with normal waveform and velocity. LEFT CAROTID ARTERY: Mild amount of calcified plaque is present at the level of the carotid bulb and proximal ICA. Velocities and waveforms are unremarkable and estimated left ICA stenosis is less than 50%. LEFT VERTEBRAL ARTERY: Antegrade flow with normal waveform and velocity. IMPRESSION: Mild amount of plaque at the level of both carotid bulbs and proximal ICAs. No significant carotid stenosis identified with estimated bilateral proximal ICA stenoses of less than 50%. Electronically Signed   By: Irish Lack M.D.   On: 06/09/2015 10:07   Mr Maxine Glenn Head/brain Wo Cm  06/08/2015  CLINICAL DATA:  60 year old hypertensive female with rheumatoid arthritis and dementia with episode of difficulty speaking. Subsequent encounter. EXAM: MRI HEAD WITHOUT CONTRAST MRA HEAD WITHOUT CONTRAST  TECHNIQUE: Multiplanar, multiecho pulse sequences of the brain and surrounding structures were obtained without intravenous contrast. Angiographic images of the head were obtained using MRA technique without contrast. COMPARISON:  06/08/2015 CT.  No comparison MR. FINDINGS: MRI HEAD FINDINGS No acute infarct. Multiple remote bilateral cerebellar infarcts. Remote right parietal-occipital temporal lobe infarct. Remote left parietal lobe infarct. Remote bilateral centrum semi ovale/corona radiata infarcts. Remote basal ganglia infarcts. Moderate  chronic microvascular disease. Scattered tiny blood breakdown products most notable left temporal region most likely related to prior episodes hemorrhagic ischemia. Global atrophy without hydrocephalus. No intracranial mass lesion noted on this unenhanced exam. Orbital structures unremarkable. Erosion of the dens with cranial settling and transverse ligament prominence consistent with patient's history of rheumatoid arthritis. Prior surgery upper cervical spine. Spinal stenosis most prominent C5-6 level. Orbital structures unremarkable. Minimal partial opacification mastoid air cells and ethmoid sinus air cells. 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 Right carotid artery mildly ectatic and irregular without high-grade stenosis. Mild irregularity and narrowing left carotid artery without high-grade stenosis. Mild irregularity M1 segment middle cerebral artery bilaterally without high-grade stenosis. Middle cerebral artery M2/ 3 moderate to marked branch vessel irregularity bilaterally. 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 Mild to moderate narrowing distal vertebral artery bilaterally. Nonvisualized right posterior inferior cerebellar artery and left anterior inferior cerebellar artery. Only proximal aspect of the right anterior inferior cerebellar artery is visualized. Mild moderate narrowing portions of the left posterior inferior cerebellar artery. Moderate to marked narrowing distal branches posterior cerebral artery bilaterally. IMPRESSION: MRI HEAD No acute infarct. Multiple remote bilateral cerebellar infarcts. Remote right parietal-occipital temporal lobe infarct. Remote left parietal lobe infarct. Remote bilateral centrum semi ovale/corona radiata infarcts. Remote basal ganglia infarcts. Moderate chronic microvascular disease. Scattered tiny blood breakdown products most notable left temporal region most likely related to prior episodes hemorrhagic  ischemia. Global atrophy without hydrocephalus. Erosion of the dens with cranial settling and transverse ligament prominence consistent with patient's history of rheumatoid arthritis. Prior surgery upper cervical spine. Spinal stenosis most prominent C5-6 level. MRA HEAD Right carotid artery mildly ectatic and irregular without high-grade stenosis. Mild irregularity and narrowing of the left internal carotid artery without high-grade stenosis. Mild irregularity M1 segment middle cerebral artery bilaterally without high-grade stenosis. Middle cerebral artery M2/ M3 moderate to marked branch vessel irregularity bilaterally. 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 Mild to moderate narrowing distal vertebral artery bilaterally. Nonvisualized right posterior inferior cerebellar artery and left anterior inferior cerebellar artery. Only proximal aspect of the right anterior inferior cerebellar artery is visualized. Mild to moderate narrowing portions of the left posterior inferior cerebellar artery. Moderate to marked narrowing distal branches posterior cerebral artery bilaterally. Electronically Signed   By: Lacy Duverney M.D.   On: 06/08/2015 16:30        Management plans discussed with the patient, family and they are in agreement.  CODE STATUS:     Code Status Orders        Start     Ordered   06/08/15 1445  Full code   Continuous     06/08/15 1445    Code Status History    Date Active Date Inactive Code Status Order ID Comments User Context   This patient has a current code status but no historical code status.      TOTAL TIME TAKING CARE OF THIS PATIENT: 33 minutes.    Shaune Pollack M.D on 06/09/2015 at 3:32 PM  Between  7am to 6pm - Pager - [phone]  After 6pm go to [email or web address] - password [email or web address] [facility]  [facility]  Office  [phone]  CC: Primary care physician; Jonathon Bellows, NP

## 2015-06-09 NOTE — Progress Notes (Signed)
SLP Note  Patient Details Name: Ruth Price MRN: [id number] DOB: 06-05-1955   SLP NOTE:       Reason Eval/Treat Not Completed: SLP screened, no needs identified, will sign off Reviewed chart and spoke with nsg and pt. [name] is a 60 y.o. female history of psoriatic arthritis, history of hypertension, CVA, prior head injury who presents for evaluation of transient confusion with word finding difficulty today, gradual onset, now resolved, no modifying factors. The patient reports that she was doing some work with her checkbook just prior to arrival when suddenly she became unable to perform additional or subtraction, she also had word finding difficulty. This lasted approximately 7 minutes before it resolved. No chest pain difficulty breathing, no abdominal pain, no vomiting, diarrhea, fevers or chills but she denies any associated numbness or weakness. She denies any dysuria, hematuria, or increased urinary frequent.  Pt drinking thin liquid via straw during conversation with ST without difficulty observed. Pt denies any swallowing difficulty and no reports of difficulty from nsg. Pt able to converse appropriately with ST and states all speech symptoms have resolved since admission. Pt to f/u with MD if any further concerns arise.    [employer],Maaran 06/09/2015, 11:26 AM

## 2015-06-09 NOTE — Progress Notes (Signed)
Pt being rolled out in wheelchair by staff, picked up by family members.

## 2015-06-09 NOTE — Evaluation (Signed)
Physical Therapy Evaluation Patient Details Name: Ruth Price MRN: [id number] DOB: 1955-10-04 Today's Date: 06/09/2015   History of Present Illness  60 y.o. female with a known history of hypertension, RA and dementia. The patient was doing some work with her checkbook just prior to arrival. Patient was unable to talk even she could listen. The patient denies any focal weakness or numbness. She denies any other symptoms. Her CAT scan of the head showed subtle acute CVA or chronic  Clinical Impression  Pt is able to get up and ambulate into the hallway and go up/down steps.  She has slow, shuffling gait but reports that this is near her baseline.  She is not particularly interested in HHPT and states she knows some exercises and will do them on her own.      Follow Up Recommendations  (pt interested in an aide to assist with baths, etc)    Equipment Recommendations       Recommendations for Other Services       Precautions / Restrictions Precautions Precautions: Fall Restrictions Weight Bearing Restrictions: No      Mobility  Bed Mobility Overal bed mobility: Modified Independent             General bed mobility comments: Pt slow and labored with getting to EOB with extensive small scooting efforts to get to upright.  Able to do w/o direct assit  Transfers Overall transfer level: Modified independent Equipment used: 2 person hand held assist             General transfer comment: Pt has a difficult time getting body weight forward (LE stiffness?) and has some unsteadiness to get to upright.  She is unable to control descent to recliner w/o assist (bur apparently this is near baseline)  Ambulation/Gait Ambulation/Gait assistance: Min guard Ambulation Distance (Feet): 75 Feet         General Gait Details: Pt with slow, shuffling steps but is able to increase speed/cadence a little with increased distance.  She shows good confidence despite some unsteadiness.     Stairs Stairs: Yes Stairs assistance: Min guard Stair Management: One rail Left (holding PTs hands to ascend) Number of Stairs: 3 General stair comments: Pt is reliant on holding PTs hands to go up steps (at baseline she does this with daughter) but can descend using 1 rail w/o assist.   Wheelchair Mobility    Modified Rankin (Stroke Patients Only)       Balance                                             Pertinent Vitals/Pain      Home Living Family/patient expects to be discharged to:: Private residence Living Arrangements: Children Available Help at Discharge: Family   Home Access: Stairs to enter Entrance Stairs-Rails: Can reach both Entrance Stairs-Number of Steps: 2          Prior Function Level of Independence: Needs assistance      ADL's / Homemaking Assistance Needed: daughter apparently helps with bathing, etc  Comments: Pt is able to get out of the house occasionally and does stay active with writing, etc     Hand Dominance        Extremity/Trunk Assessment   Upper Extremity Assessment: Generalized weakness (signficant RA deformities R>L in hands, )  Lower Extremity Assessment: Generalized weakness (Pt with stiffness in b/l knee, RA limitations t/o)         Communication   Communication: No difficulties  Cognition Arousal/Alertness: Awake/alert Behavior During Therapy: WFL for tasks assessed/performed Overall Cognitive Status: Within Functional Limits for tasks assessed                      General Comments      Exercises        Assessment/Plan    PT Assessment Patient needs continued PT services  PT Diagnosis Difficulty walking;Generalized weakness   PT Problem List Decreased strength;Decreased balance;Decreased mobility;Decreased activity tolerance;Decreased safety awareness  PT Treatment Interventions Gait training;Functional mobility training;Therapeutic activities;Therapeutic  exercise;Balance training;Neuromuscular re-education;Stair training   PT Goals (Current goals can be found in the Care Plan section) Acute Rehab PT Goals Patient Stated Goal: go home PT Goal Formulation: With patient Time For Goal Achievement: 06/23/15 Potential to Achieve Goals: Fair    Frequency Min 2X/week   Barriers to discharge        Co-evaluation               End of Session Equipment Utilized During Treatment: Gait belt Activity Tolerance: Patient tolerated treatment well Patient left: with call bell/phone within reach;with chair alarm set           Time: 1696-7893 PT Time Calculation (min) (ACUTE ONLY): 23 min   Charges:   PT Evaluation $PT Eval Low Complexity: 1 Procedure     PT G CodesMalachi Pro, DPT 06/09/2015, 1:42 PM

## 2015-06-09 NOTE — Discharge Instructions (Signed)
Heart healthy diet. Activity as tolerated. Fall precaution. Follow up PCP for possible discontinuation of naproxen. Alcohol cessation gradually. Detox outpatient prn.

## 2015-06-10 LAB — URINE CULTURE: Culture: >=100000 — AB

## 2015-06-13 LAB — CULTURE, BLOOD (ROUTINE X 2)
Culture: NO GROWTH
Culture: NO GROWTH

## 2015-10-27 ENCOUNTER — Emergency Department: Payer: Commercial Managed Care - HMO

## 2015-10-27 ENCOUNTER — Inpatient Hospital Stay: Payer: Commercial Managed Care - HMO

## 2015-10-27 ENCOUNTER — Inpatient Hospital Stay
Admission: EM | Admit: 2015-10-27 | Discharge: 2015-11-02 | DRG: 492 | Disposition: A | Discharge status: Skilled Nursing Facility | Payer: Commercial Managed Care - HMO | Attending: Internal Medicine | Admitting: Internal Medicine

## 2015-10-27 ENCOUNTER — Encounter: Payer: Self-pay | Admitting: Emergency Medicine

## 2015-10-27 DIAGNOSIS — M869 Osteomyelitis, unspecified: Secondary | ICD-10-CM | POA: Diagnosis present

## 2015-10-27 DIAGNOSIS — E43 Unspecified severe protein-calorie malnutrition: Secondary | ICD-10-CM | POA: Diagnosis present

## 2015-10-27 DIAGNOSIS — L97519 Non-pressure chronic ulcer of other part of right foot with unspecified severity: Secondary | ICD-10-CM | POA: Diagnosis present

## 2015-10-27 DIAGNOSIS — Z7982 Long term (current) use of aspirin: Secondary | ICD-10-CM | POA: Diagnosis not present

## 2015-10-27 DIAGNOSIS — L97329 Non-pressure chronic ulcer of left ankle with unspecified severity: Secondary | ICD-10-CM | POA: Diagnosis present

## 2015-10-27 DIAGNOSIS — B9689 Other specified bacterial agents as the cause of diseases classified elsewhere: Secondary | ICD-10-CM | POA: Diagnosis present

## 2015-10-27 DIAGNOSIS — Z8249 Family history of ischemic heart disease and other diseases of the circulatory system: Secondary | ICD-10-CM | POA: Diagnosis not present

## 2015-10-27 DIAGNOSIS — B9562 Methicillin resistant Staphylococcus aureus infection as the cause of diseases classified elsewhere: Secondary | ICD-10-CM | POA: Diagnosis present

## 2015-10-27 DIAGNOSIS — M86172 Other acute osteomyelitis, left ankle and foot: Secondary | ICD-10-CM | POA: Diagnosis present

## 2015-10-27 DIAGNOSIS — F0280 Dementia in other diseases classified elsewhere without behavioral disturbance: Secondary | ICD-10-CM | POA: Insufficient documentation

## 2015-10-27 DIAGNOSIS — Z681 Body mass index (BMI) 19 or less, adult: Secondary | ICD-10-CM

## 2015-10-27 DIAGNOSIS — N179 Acute kidney failure, unspecified: Secondary | ICD-10-CM | POA: Diagnosis present

## 2015-10-27 DIAGNOSIS — Z833 Family history of diabetes mellitus: Secondary | ICD-10-CM

## 2015-10-27 DIAGNOSIS — L97509 Non-pressure chronic ulcer of other part of unspecified foot with unspecified severity: Secondary | ICD-10-CM

## 2015-10-27 DIAGNOSIS — E860 Dehydration: Secondary | ICD-10-CM

## 2015-10-27 DIAGNOSIS — Z9889 Other specified postprocedural states: Secondary | ICD-10-CM | POA: Diagnosis not present

## 2015-10-27 DIAGNOSIS — Z888 Allergy status to other drugs, medicaments and biological substances status: Secondary | ICD-10-CM | POA: Diagnosis not present

## 2015-10-27 DIAGNOSIS — Z9660 Presence of unspecified orthopedic joint implant: Secondary | ICD-10-CM | POA: Diagnosis present

## 2015-10-27 DIAGNOSIS — E871 Hypo-osmolality and hyponatremia: Secondary | ICD-10-CM | POA: Diagnosis present

## 2015-10-27 DIAGNOSIS — I129 Hypertensive chronic kidney disease with stage 1 through stage 4 chronic kidney disease, or unspecified chronic kidney disease: Secondary | ICD-10-CM | POA: Diagnosis present

## 2015-10-27 DIAGNOSIS — D649 Anemia, unspecified: Secondary | ICD-10-CM | POA: Diagnosis present

## 2015-10-27 DIAGNOSIS — L4050 Arthropathic psoriasis, unspecified: Secondary | ICD-10-CM | POA: Diagnosis present

## 2015-10-27 DIAGNOSIS — G309 Alzheimer's disease, unspecified: Secondary | ICD-10-CM

## 2015-10-27 DIAGNOSIS — F1010 Alcohol abuse, uncomplicated: Secondary | ICD-10-CM | POA: Insufficient documentation

## 2015-10-27 DIAGNOSIS — Z8673 Personal history of transient ischemic attack (TIA), and cerebral infarction without residual deficits: Secondary | ICD-10-CM

## 2015-10-27 DIAGNOSIS — M86179 Other acute osteomyelitis, unspecified ankle and foot: Secondary | ICD-10-CM

## 2015-10-27 DIAGNOSIS — Z452 Encounter for adjustment and management of vascular access device: Secondary | ICD-10-CM

## 2015-10-27 DIAGNOSIS — L97324 Non-pressure chronic ulcer of left ankle with necrosis of bone: Secondary | ICD-10-CM

## 2015-10-27 DIAGNOSIS — T797XXA Traumatic subcutaneous emphysema, initial encounter: Secondary | ICD-10-CM | POA: Diagnosis present

## 2015-10-27 DIAGNOSIS — M810 Age-related osteoporosis without current pathological fracture: Secondary | ICD-10-CM | POA: Diagnosis present

## 2015-10-27 DIAGNOSIS — N941 Unspecified dyspareunia: Secondary | ICD-10-CM | POA: Diagnosis present

## 2015-10-27 DIAGNOSIS — Z79899 Other long term (current) drug therapy: Secondary | ICD-10-CM

## 2015-10-27 DIAGNOSIS — F0150 Vascular dementia without behavioral disturbance: Secondary | ICD-10-CM | POA: Diagnosis present

## 2015-10-27 DIAGNOSIS — M6281 Muscle weakness (generalized): Secondary | ICD-10-CM

## 2015-10-27 DIAGNOSIS — R262 Difficulty in walking, not elsewhere classified: Secondary | ICD-10-CM

## 2015-10-27 DIAGNOSIS — N183 Chronic kidney disease, stage 3 (moderate): Secondary | ICD-10-CM | POA: Diagnosis present

## 2015-10-27 DIAGNOSIS — M069 Rheumatoid arthritis, unspecified: Secondary | ICD-10-CM | POA: Diagnosis present

## 2015-10-27 LAB — CBC WITH DIFFERENTIAL/PLATELET
BASOS ABS: 0.1 10*3/uL (ref 0–0.1)
Basophils Relative: 1 %
Eosinophils Absolute: 0.1 10*3/uL (ref 0–0.7)
Eosinophils Relative: 2 %
HEMATOCRIT: 31.6 % — AB (ref 35.0–47.0)
HEMOGLOBIN: 10.8 g/dL — AB (ref 12.0–16.0)
LYMPHS PCT: 8 %
Lymphs Abs: 0.6 10*3/uL — ABNORMAL LOW (ref 1.0–3.6)
MCH: 29.6 pg (ref 26.0–34.0)
MCHC: 34.1 g/dL (ref 32.0–36.0)
MCV: 87 fL (ref 80.0–100.0)
Monocytes Absolute: 0.5 10*3/uL (ref 0.2–0.9)
Monocytes Relative: 7 %
NEUTROS ABS: 6.1 10*3/uL (ref 1.4–6.5)
Neutrophils Relative %: 82 %
Platelets: 288 10*3/uL (ref 150–440)
RBC: 3.63 MIL/uL — AB (ref 3.80–5.20)
RDW: 14.8 % — ABNORMAL HIGH (ref 11.5–14.5)
WBC: 7.4 10*3/uL (ref 3.6–11.0)

## 2015-10-27 LAB — LACTIC ACID, PLASMA: Lactic Acid, Venous: 1.3 mmol/L (ref 0.5–1.9)

## 2015-10-27 LAB — COMPREHENSIVE METABOLIC PANEL
ALBUMIN: 3.1 g/dL — AB (ref 3.5–5.0)
ALT: 12 U/L — ABNORMAL LOW (ref 14–54)
ANION GAP: 7 (ref 5–15)
AST: 19 U/L (ref 15–41)
Alkaline Phosphatase: 93 U/L (ref 38–126)
BILIRUBIN TOTAL: 0.2 mg/dL — AB (ref 0.3–1.2)
BUN: 28 mg/dL — ABNORMAL HIGH (ref 6–20)
CO2: 24 mmol/L (ref 22–32)
Calcium: 9.2 mg/dL (ref 8.9–10.3)
Chloride: 101 mmol/L (ref 101–111)
Creatinine, Ser: 1.4 mg/dL — ABNORMAL HIGH (ref 0.44–1.00)
GFR calc Af Amer: 46 mL/min — ABNORMAL LOW (ref >60)
GFR calc non Af Amer: 39 mL/min — ABNORMAL LOW (ref >60)
GLUCOSE: 122 mg/dL — AB (ref 65–99)
POTASSIUM: 4.5 mmol/L (ref 3.5–5.1)
Sodium: 132 mmol/L — ABNORMAL LOW (ref 135–145)
TOTAL PROTEIN: 7.4 g/dL (ref 6.5–8.1)

## 2015-10-27 MED ORDER — OMEGA-3-ACID ETHYL ESTERS 1 G PO CAPS
2.0000 g | ORAL_CAPSULE | Freq: Every day | ORAL | Status: DC
  Administered 2015-10-28 – 2015-11-02 (×6): 2 g via ORAL
  Filled 2015-10-27 (×6): qty 2

## 2015-10-27 MED ORDER — SENNOSIDES-DOCUSATE SODIUM 8.6-50 MG PO TABS: 1.0000 | ORAL_TABLET | Freq: Every evening | ORAL | Status: DC | PRN

## 2015-10-27 MED ORDER — ZOLPIDEM TARTRATE 5 MG PO TABS
5.0000 mg | ORAL_TABLET | Freq: Every evening | ORAL | Status: DC | PRN
  Administered 2015-11-01: 5 mg via ORAL
  Filled 2015-10-27: qty 1

## 2015-10-27 MED ORDER — ADULT MULTIVITAMIN W/MINERALS CH
1.0000 | ORAL_TABLET | Freq: Every day | ORAL | Status: DC
  Administered 2015-10-28 – 2015-11-02 (×6): 1 via ORAL
  Filled 2015-10-27 (×6): qty 1

## 2015-10-27 MED ORDER — CALCIUM CARBONATE ANTACID 500 MG PO CHEW
600.0000 mg | CHEWABLE_TABLET | Freq: Every day | ORAL | Status: DC
  Administered 2015-10-28 – 2015-11-01 (×5): 600 mg via ORAL
  Filled 2015-10-27 (×5): qty 3

## 2015-10-27 MED ORDER — ASPIRIN 81 MG PO CHEW
81.0000 mg | CHEWABLE_TABLET | Freq: Every day | ORAL | Status: DC
Start: 2015-10-28 — End: 2015-11-02
  Administered 2015-10-28 – 2015-11-02 (×6): 81 mg via ORAL
  Filled 2015-10-27 (×6): qty 1

## 2015-10-27 MED ORDER — BISACODYL 5 MG PO TBEC: 5.0000 mg | DELAYED_RELEASE_TABLET | Freq: Every day | ORAL | Status: DC | PRN

## 2015-10-27 MED ORDER — VANCOMYCIN HCL IN DEXTROSE 1-5 GM/200ML-% IV SOLN
1000.0000 mg | INTRAVENOUS | Status: DC
  Filled 2015-10-27: qty 200

## 2015-10-27 MED ORDER — ONDANSETRON HCL 4 MG/2ML IJ SOLN
4.0000 mg | Freq: Four times a day (QID) | INTRAMUSCULAR | Status: DC | PRN
  Administered 2015-10-28 – 2015-10-31 (×2): 4 mg via INTRAVENOUS
  Filled 2015-10-27: qty 2

## 2015-10-27 MED ORDER — FOLIC ACID 1 MG PO TABS
1.0000 mg | ORAL_TABLET | Freq: Every day | ORAL | Status: DC
  Administered 2015-10-28 – 2015-11-02 (×6): 1 mg via ORAL
  Filled 2015-10-27 (×6): qty 1

## 2015-10-27 MED ORDER — ATORVASTATIN CALCIUM 20 MG PO TABS
40.0000 mg | ORAL_TABLET | Freq: Every day | ORAL | Status: DC
  Administered 2015-10-27 – 2015-11-01 (×6): 40 mg via ORAL
  Filled 2015-10-27 (×6): qty 2

## 2015-10-27 MED ORDER — MAGNESIUM CITRATE PO SOLN: 1.0000 | Freq: Once | ORAL | Status: DC | PRN

## 2015-10-27 MED ORDER — ENOXAPARIN SODIUM 30 MG/0.3ML ~~LOC~~ SOLN
30.0000 mg | Freq: Every day | SUBCUTANEOUS | Status: DC
  Administered 2015-10-27 – 2015-11-01 (×6): 30 mg via SUBCUTANEOUS
  Filled 2015-10-27 (×6): qty 0.3

## 2015-10-27 MED ORDER — ONDANSETRON HCL 4 MG PO TABS: 4.0000 mg | ORAL_TABLET | Freq: Four times a day (QID) | ORAL | Status: DC | PRN

## 2015-10-27 MED ORDER — VANCOMYCIN HCL IN DEXTROSE 750-5 MG/150ML-% IV SOLN
750.0000 mg | Freq: Once | INTRAVENOUS | Status: AC
  Administered 2015-10-27: 750 mg via INTRAVENOUS
  Filled 2015-10-27: qty 150

## 2015-10-27 MED ORDER — ACETAMINOPHEN 650 MG RE SUPP: 650.0000 mg | Freq: Four times a day (QID) | RECTAL | Status: DC | PRN

## 2015-10-27 MED ORDER — OXYCODONE-ACETAMINOPHEN 5-325 MG PO TABS
1.0000 | ORAL_TABLET | Freq: Every day | ORAL | Status: DC | PRN
  Administered 2015-10-28 – 2015-10-30 (×2): 1 via ORAL
  Filled 2015-10-27 (×2): qty 1

## 2015-10-27 MED ORDER — ACETAMINOPHEN 325 MG PO TABS
650.0000 mg | ORAL_TABLET | Freq: Four times a day (QID) | ORAL | Status: DC | PRN
  Administered 2015-10-27: 650 mg via ORAL
  Filled 2015-10-27: qty 2

## 2015-10-27 MED ORDER — PIPERACILLIN-TAZOBACTAM 3.375 G IVPB 30 MIN
3.3750 g | Freq: Once | INTRAVENOUS | Status: AC
  Administered 2015-10-27: 3.375 g via INTRAVENOUS
  Filled 2015-10-27: qty 50

## 2015-10-27 MED ORDER — DONEPEZIL HCL 5 MG PO TABS
10.0000 mg | ORAL_TABLET | Freq: Every day | ORAL | Status: DC
  Administered 2015-10-27 – 2015-11-01 (×6): 10 mg via ORAL
  Filled 2015-10-27 (×6): qty 2

## 2015-10-27 MED ORDER — HYDROCODONE-ACETAMINOPHEN 5-325 MG PO TABS
1.0000 | ORAL_TABLET | ORAL | Status: DC | PRN
  Administered 2015-10-28: 2 via ORAL
  Filled 2015-10-27: qty 2

## 2015-10-27 MED ORDER — SODIUM CHLORIDE 0.9 % IV BOLUS (SEPSIS)
1000.0000 mL | Freq: Once | INTRAVENOUS | Status: AC
  Administered 2015-10-27: 1000 mL via INTRAVENOUS

## 2015-10-27 MED ORDER — BIOTIN 1000 MCG PO TABS: 1000.0000 ug | ORAL_TABLET | Freq: Every day | ORAL | Status: DC

## 2015-10-27 MED ORDER — PIPERACILLIN-TAZOBACTAM 3.375 G IVPB
3.3750 g | Freq: Three times a day (TID) | INTRAVENOUS | Status: DC
  Administered 2015-10-28 – 2015-11-02 (×16): 3.375 g via INTRAVENOUS
  Filled 2015-10-27 (×15): qty 50

## 2015-10-27 MED ORDER — METOPROLOL TARTRATE 25 MG PO TABS
25.0000 mg | ORAL_TABLET | Freq: Two times a day (BID) | ORAL | Status: DC
  Administered 2015-10-27: 22:00:00 25 mg via ORAL
  Filled 2015-10-27: qty 1

## 2015-10-27 MED ORDER — SODIUM CHLORIDE 0.9 % IV SOLN
INTRAVENOUS | Status: DC
  Administered 2015-10-27 – 2015-10-31 (×7): via INTRAVENOUS
  Administered 2015-10-31: 1000 mL via INTRAVENOUS
  Administered 2015-11-02: 75 mL/h via INTRAVENOUS

## 2015-10-27 NOTE — ED Notes (Signed)
Attempted to call report at this time 

## 2015-10-27 NOTE — Progress Notes (Signed)
Pharmacy Antibiotic Note  Ruth Price is a 60 y.o. female admitted on 10/27/2015 with osteomyelitis.  Pharmacy has been consulted for Zosyn and vancomycin dosing.  Plan: 1. Zosyn 3.375 gm IV Q8H EI 2. Vancomycin 750 mg IV x 1 in ED followed by vancomycin 1 gm IV Q36H, predicted trough 17 mcg/mL, pharmacy will continue to follow and adjust as needed to maintain trough 15 to 20 mcg/mL.  Vd 31.8 L, Ke 0.029 hr-1, T1/2 23.7 hr  Height: 5\' 5"  (165.1 cm) Weight: 100 lb (45.4 kg) IBW/kg (Calculated) : 57  Temp (24hrs), Avg:98.1 F (36.7 C), Min:97.5 F (36.4 C), Max:98.7 F (37.1 C)   Recent Labs Lab 10/27/15 1646 10/27/15 1647  WBC 7.4  --   CREATININE 1.40*  --   LATICACIDVEN  --  1.3    Estimated Creatinine Clearance: 29.9 mL/min (by C-G formula based on SCr of 1.4 mg/dL (H)).    Allergies  Allergen Reactions  . Levofloxacin Other (See Comments)    Reaction:  Unknown      Thank you for allowing pharmacy to be a part of this patient's care.  10/29/15, Pharm.D., BCPS Clinical Pharmacist 10/27/2015 9:31 PM

## 2015-10-27 NOTE — ED Notes (Signed)
Report called to Kasey, RN. 

## 2015-10-27 NOTE — ED Triage Notes (Signed)
Has scab to L ankle. States has had this sore x 1 year, denies new injury, noted scabbed wound. States having trouble controlling pain with aleve. States is not getting any care for this wound.

## 2015-10-27 NOTE — H&P (Signed)
[facility] - [place] @ [facility] Admission History and Physical [employer], D.O.  ---------------------------------------------------------------------------------------------------------------------   PATIENT NAME: Ruth Price MR#: [id number] DATE OF BIRTH: April 13, 1955 DATE OF ADMISSION: 10/27/2015 PRIMARY CARE PHYSICIAN: Jonathon Bellows, NP  REQUESTING/REFERRING PHYSICIAN: ED NP Cari Triplett  CHIEF COMPLAINT: Chief Complaint  Patient presents with  . Ankle Pain    HISTORY OF PRESENT ILLNESS: Ruth Price is a 60 y.o. female with a known history of Dementia, hypertension, psoriatic arthritis and rheumatoid arthritis presented to the emergency department for evaluation of left lateral ankle pain. Patient states that she has been dealing with a skin ulcer for the past year at least. She has had podiatry follow-up as well as wound care. She reports that recently she has noticed increasing redness, mild swelling, increase in the size of the scab and pain to the ankle and foot. She reports that she was recently discharged from the alcohol rehabilitation where she had multiple nurses who had uncapped the wound.  She also reports having an ulceration between the third and fourth digits on her right foot which has also been worsening.  Otherwise there has been no change in status. Patient has been taking medication as prescribed and there has been no recent change in medication or diet.  There has been no recent illness, travel or sick contacts.    Patient denies fevers/chills, weakness, dizziness, chest pain, shortness of breath, N/V/C/D, abdominal pain, dysuria/frequency, changes in mental status.   EMS/ED COURSE:   Patient received Zosyn and Vanco  PAST MEDICAL HISTORY: Past Medical History:  Diagnosis Date  . Dementia   . Hypertension   . RA (rheumatoid arthritis) (HCC)   TIA / CVA Vascular dementia Alcohol abuse Osteoarthritis Psoriatic arthritis     PAST  SURGICAL HISTORY: Past Surgical History:  Procedure Laterality Date  . JOINT REPLACEMENT    . SPINE SURGERY        SOCIAL HISTORY: Social History  Substance Use Topics  . Smoking status: Never Smoker  . Smokeless tobacco: Not on file  . Alcohol use No  Patient reports recent history of drinking up to half a gallon of alcohol per week however she has recently completed inpatient rehabilitation and has not been drinking     FAMILY HISTORY: Family History  Problem Relation Age of Onset  . Diabetes Mother   . Heart disease Father      MEDICATIONS AT HOME: Prior to Admission medications   Medication Sig Start Date End Date Taking? Authorizing Provider  B Complex-C (SUPER B COMPLEX PO) Take 1 capsule by mouth daily.   Yes Historical Provider, MD  Biotin 1000 MCG tablet Take 1,000 mcg by mouth daily.   Yes Historical Provider, MD  cyclobenzaprine (FLEXERIL) 10 MG tablet Take 10 mg by mouth 2 (two) times daily as needed for muscle spasms.   Yes Historical Provider, MD  folic acid (FOLVITE) 1 MG tablet Take 1 mg by mouth daily.   Yes Historical Provider, MD  Glucosamine-Chondroitin-MSM [phone] MG TABS Take 1 tablet by mouth daily.   Yes Historical Provider, MD  Multiple Vitamin (MULTIVITAMIN WITH MINERALS) TABS tablet Take 1 tablet by mouth daily.   Yes Historical Provider, MD  naproxen sodium (ANAPROX) 220 MG tablet Take 440 mg by mouth daily with breakfast.   Yes Historical Provider, MD  omega-3 acid ethyl esters (LOVAZA) 1 g capsule Take 2 g by mouth daily.   Yes Historical Provider, MD  oxyCODONE-acetaminophen (PERCOCET/ROXICET) 5-325 MG tablet Take 1 tablet  by mouth daily as needed for severe pain.   Yes Historical Provider, MD  aspirin 81 MG chewable tablet Chew 1 tablet (81 mg total) by mouth daily. Patient not taking: Reported on 10/27/2015 06/09/15   Shaune Pollack, MD  atorvastatin (LIPITOR) 40 MG tablet Take 40 mg by mouth at bedtime.    Historical Provider, MD  calcium  carbonate (OSCAL) 1500 (600 Ca) MG TABS tablet Take 600 mg of elemental calcium by mouth daily with breakfast.    Historical Provider, MD  donepezil (ARICEPT) 10 MG tablet Take 10 mg by mouth at bedtime.    Historical Provider, MD  metoprolol tartrate (LOPRESSOR) 25 MG tablet Take 1 tablet (25 mg total) by mouth 2 (two) times daily. Patient not taking: Reported on 10/27/2015 06/09/15   Shaune Pollack, MD      DRUG ALLERGIES: Allergies  Allergen Reactions  . Levofloxacin Other (See Comments)    Reaction:  Unknown      REVIEW OF SYSTEMS: CONSTITUTIONAL: No fever/chills, fatigue, weakness, weight gain/loss, headache EYES: No blurry or double vision. ENT: No tinnitus, postnasal drip, redness or soreness of the oropharynx. RESPIRATORY: No cough, wheeze, hemoptysis, dyspnea. CARDIOVASCULAR: No chest pain, orthopnea, palpitations, syncope. GASTROINTESTINAL: No nausea, vomiting, constipation, diarrhea, abdominal pain, hematemesis, melena or hematochezia. GENITOURINARY: No dysuria or hematuria. ENDOCRINE: No polyuria or nocturia. No heat or cold intolerance. HEMATOLOGY: No anemia, bruising, bleeding. INTEGUMENTARY: No rashes, ulcers, lesions the exception of left lateral ankle wound and right interdigital wound MUSCULOSKELETAL: No arthritis, swelling, gout. NEUROLOGIC: No numbness, tingling, weakness or ataxia. No seizure-type activity. PSYCHIATRIC: No anxiety, depression, insomnia.  PHYSICAL EXAMINATION: VITAL SIGNS: Blood pressure 110/72, pulse 89, temperature 97.5 F (36.4 C), temperature source Oral, resp. rate 20, height 5\' 5"  (1.651 m), weight 45.4 kg (100 lb), SpO2 100 %.  GENERAL: 60 y.o.-year-old White female patient, well-developed, well-nourished lying in the bed in no acute distress.  Pleasant and cooperative.   HEENT: Head atraumatic, normocephalic. Pupils equal, round, reactive to light and accommodation. No scleral icterus. Extraocular muscles intact. Nares are patent. Oropharynx is  clear. Mucus membranes moist. NECK: Supple, full range of motion. No JVD, no bruit heard. No thyroid enlargement, no tenderness, no cervical lymphadenopathy. CHEST: Normal breath sounds bilaterally. No wheezing, rales, rhonchi or crackles. No use of accessory muscles of respiration.  No reproducible chest wall tenderness.  CARDIOVASCULAR: S1, S2 normal. No murmurs, rubs, or gallops. Cap refill <2 seconds. ABDOMEN: Soft, nontender, nondistended. No rebound, guarding, rigidity. Normoactive bowel sounds present in all four quadrants. No organomegaly or mass. EXTREMITIES: Full range of motion. No pedal edema, cyanosis, or clubbing. NEUROLOGIC: Cranial nerves II through XII are grossly intact with no focal sensorimotor deficit. Muscle strength 5/5 in all extremities. Sensation intact. Gait not checked. PSYCHIATRIC: The patient is alert and oriented x 3. Normal affect, mood, thought content. SKIN: Warm, dry, and intact without obvious rash, lesion, or ulcer with the exception of a large left lateral ankle wound with scab, mild erythema surrounding. There is also a superficial ulceration in between the third and fourth digit on the right foot.  LABORATORY PANEL:  CBC  Recent Labs Lab 10/27/15 1646  WBC 7.4  HGB 10.8*  HCT 31.6*  PLT 288   ----------------------------------------------------------------------------------------------------------------- Chemistries  Recent Labs Lab 10/27/15 1646  NA 132*  K 4.5  CL 101  CO2 24  GLUCOSE 122*  BUN 28*  CREATININE 1.40*  CALCIUM 9.2  AST 19  ALT 12*  ALKPHOS 93  BILITOT 0.2*   ------------------------------------------------------------------------------------------------------------------  Cardiac Enzymes No results for input(s): TROPONINI in the last 168 hours. ------------------------------------------------------------------------------------------------------------------  RADIOLOGY: Dg Ankle Complete Left  Result Date:  10/27/2015 CLINICAL DATA:  Nonhealing wound in the lateral left ankle for 1 year. EXAM: LEFT ANKLE COMPLETE - 3+ VIEW COMPARISON:  None. FINDINGS: Diffuse osteopenia. Vascular calcifications throughout the soft tissues. 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 There is soft tissue swelling with soft tissue defect in the lateral left ankle. There is underlying mild cortical erosion at the lower left lateral malleolus. There is a tiny amount of soft tissue gas deep to the soft tissue defect in the lateral left ankle. Small plantar left calcaneal spur. IMPRESSION: 1. Acute osteomyelitis in the lower left lateral malleolus with associated overlying soft tissue defect and subcutaneous emphysema. 2. 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 3. Diffuse osteopenia. Electronically Signed   By: Delbert Phenix M.D.   On: 10/27/2015 16:51    IMPRESSION AND PLAN:  This is a 60 y.o. female with a history of rheumatoid arthritis, psoriatic arthritis, vascular dementia, alcohol abuse now being admitted with: 1. Acute osteomyelitis of the left lateral ankle for which we will admit inpatient for triple phase bone scan, IV antibiotics, PICC line placement, pain control podiatry consultation for wound management. Patient will also need care management for coordination of antibiotics. We will also need an x-ray of the right foot lesion to rule out osteomyelitis there as well.  2. AK I-gentle IV fluid hydration 3. Anemia, chronic, monitor CBC- 4. Hyponatremia, mild-gentle IV fluid hydration 5. History of TIA/CVA-continue aspirin 6. History of vascular dementia-continue Aricept, folic acid 7. History of hypertension-continue metoprolol   Diet/Nutrition:Heart healthy Fluids: IV normal saline DVT Px: Lovenox, SCDs and early ambulation Code Status: Full  All the records are reviewed and case discussed with ED provider. Management plans discussed with the patient and/or family  who express understanding and agree with plan of care.   TOTAL TIME TAKING CARE OF THIS PATIENT: 60 minutes.   Ruth Price D.O. on 10/27/2015 at 8:10 PM Between 7am to 6pm - Pager - [phone] After 6pm go to [email or web address] - Social research officer, government [facility] [place] [facility] Office [phone] CC: Primary care physician; Jonathon Bellows, NP     Note: This dictation was prepared with Dragon dictation along with smaller phrase technology. Any transcriptional errors that result from this process are unintentional.

## 2015-10-27 NOTE — ED Provider Notes (Signed)
[facility] Emergency Department Provider Note ____________________________________________  Time seen: Approximately 4:09 PM  I have reviewed the triage vital signs and the nursing notes.   HISTORY  Chief Complaint Ankle Pain    HPI Ruth Price is a 60 y.o. female who presents to the emergency department for evaluation of ankle pain. She has had a sore for 1 year after fracturing her right femur and an extended hospital stay. No new injury.Pain began to increase 2 days ago and she has noticed some drainage. She was recently discharged from a SNF and despite cleaning the wound, it has worsened. She denies fever, but states she forgets things.  Past Medical History:  Diagnosis Date  . Dementia   . Hypertension   . RA (rheumatoid arthritis) [facility])     Patient Active Problem List   Diagnosis Date Noted  . Alzheimer's dementia 10/27/2015  . Alcohol abuse 10/27/2015  . Osteomyelitis of ankle (HCC) 10/27/2015  . CVA (cerebral infarction) 06/08/2015    Past Surgical History:  Procedure Laterality Date  . JOINT REPLACEMENT    . SPINE SURGERY      Prior to Admission medications   Medication Sig Start Date End Date Taking? Authorizing Provider  B Complex-C (SUPER B COMPLEX PO) Take 1 capsule by mouth daily.   Yes Historical Provider, MD  Biotin 1000 MCG tablet Take 1,000 mcg by mouth daily.   Yes Historical Provider, MD  cyclobenzaprine (FLEXERIL) 10 MG tablet Take 10 mg by mouth 2 (two) times daily as needed for muscle spasms.   Yes Historical Provider, MD  folic acid (FOLVITE) 1 MG tablet Take 1 mg by mouth daily.   Yes Historical Provider, MD  Glucosamine-Chondroitin-MSM [phone] MG TABS Take 1 tablet by mouth daily.   Yes Historical Provider, MD  Multiple Vitamin (MULTIVITAMIN WITH MINERALS) TABS tablet Take 1 tablet by mouth daily.   Yes Historical Provider, MD  naproxen sodium (ANAPROX) 220 MG tablet Take 440 mg by mouth daily with breakfast.    Yes Historical Provider, MD  omega-3 acid ethyl esters (LOVAZA) 1 g capsule Take 2 g by mouth daily.   Yes Historical Provider, MD  oxyCODONE-acetaminophen (PERCOCET/ROXICET) 5-325 MG tablet Take 1 tablet by mouth daily as needed for severe pain.   Yes Historical Provider, MD  aspirin 81 MG chewable tablet Chew 1 tablet (81 mg total) by mouth daily. Patient not taking: Reported on 10/27/2015 06/09/15   Shaune Pollack, MD  atorvastatin (LIPITOR) 40 MG tablet Take 40 mg by mouth at bedtime.    Historical Provider, MD  calcium carbonate (OSCAL) 1500 (600 Ca) MG TABS tablet Take 600 mg of elemental calcium by mouth daily with breakfast.    Historical Provider, MD  donepezil (ARICEPT) 10 MG tablet Take 10 mg by mouth at bedtime.    Historical Provider, MD  metoprolol tartrate (LOPRESSOR) 25 MG tablet Take 1 tablet (25 mg total) by mouth 2 (two) times daily. Patient not taking: Reported on 10/27/2015 06/09/15   Shaune Pollack, MD    Allergies Levofloxacin  Family History  Problem Relation Age of Onset  . Diabetes Mother   . Heart disease Father     Social History Social History  Substance Use Topics  . Smoking status: Never Smoker  . Smokeless tobacco: Never Used  . Alcohol use No    Review of Systems Constitutional: No recent illness. Cardiovascular: Denies chest pain or palpitations. Respiratory: Denies shortness of breath. Musculoskeletal: Pain in left ankle. Skin: Positive for wound  on left ankle. Neurological: Negative for increase in focal weakness or numbness.  ____________________________________________   PHYSICAL EXAM:  VITAL SIGNS: ED Triage Vitals  Enc Vitals Group     BP 10/27/15 1538 110/72     Pulse Rate 10/27/15 1538 89     Resp 10/27/15 1538 20     Temp 10/27/15 1538 97.5 F (36.4 C)     Temp Source 10/27/15 1538 Oral     SpO2 10/27/15 1538 100 %     Weight 10/27/15 1539 100 lb (45.4 kg)     Height 10/27/15 1539 5\' 5"  (1.651 m)     Head Circumference --      Peak  Flow --      Pain Score 10/27/15 1539 1     Pain Loc --      Pain Edu? --      Excl. in GC? --     Constitutional: Alert and oriented. Well appearing and in no acute distress. Eyes: Conjunctivae are normal. EOMI. Head: Atraumatic. Neck: No stridor.  Respiratory: Normal respiratory effort.   Musculoskeletal: RA affected joints of the extremities. Very limited ROM of any joint.  Neurologic:  Normal speech and language. No gross focal neurologic deficits are appreciated. Speech is normal. No gait instability. Skin: Foul smelling wound over the lateral malleolus of the left ankle with purulent drainage and surrounding erythema.  Psychiatric: Mood and affect are normal. Speech and behavior are normal.  ____________________________________________   LABS (all labs ordered are listed, but only abnormal results are displayed)  Labs Reviewed  COMPREHENSIVE METABOLIC PANEL - Abnormal; Notable for the following:       Result Value   Sodium 132 (*)    Glucose, Bld 122 (*)    BUN 28 (*)    Creatinine, Ser 1.40 (*)    Albumin 3.1 (*)    ALT 12 (*)    Total Bilirubin 0.2 (*)    GFR calc non Af Amer 39 (*)    GFR calc Af Amer 46 (*)    All other components within normal limits  CBC WITH DIFFERENTIAL/PLATELET - Abnormal; Notable for the following:    RBC 3.63 (*)    Hemoglobin 10.8 (*)    HCT 31.6 (*)    RDW 14.8 (*)    Lymphs Abs 0.6 (*)    All other components within normal limits  CULTURE, BLOOD (ROUTINE X 2)  CULTURE, BLOOD (ROUTINE X 2)  LACTIC ACID, PLASMA  BASIC METABOLIC PANEL  CBC   ____________________________________________  RADIOLOGY  Left ankle:  1. Acute osteomyelitis in the lower left lateral malleolus with associated overlying soft tissue defect and subcutaneous emphysema. 2. 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 3. Diffuse osteopenia. ____________________________________________   PROCEDURES  Procedure(s) performed:  None   ____________________________________________   INITIAL IMPRESSION / ASSESSMENT AND PLAN / ED COURSE  Clinical Course    Pertinent labs & imaging results that were available during my care of the patient were reviewed by me and considered in my medical decision making (see chart for details).  Case discussed with Dr. 10/29/15 who will see the patient in consult while admitted. Vancomycin and Zosyn started while in the ER and 1 liter of fluids given in attempt to correct dehydration and mild hyponatremia.  ____________________________________________   FINAL CLINICAL IMPRESSION(S) / ED DIAGNOSES  Final diagnoses:  Mild dehydration  Acute osteomyelitis of left ankle (HCC)       Ether Griffins, FNP 10/27/15  2303    Emily Filbert, MD 10/27/15 [phone]

## 2015-10-27 NOTE — Progress Notes (Signed)
PHARMACIST - PHYSICIAN ORDER COMMUNICATION  CONCERNING: P&T Medication Policy on Herbal Medications  DESCRIPTION:  This patient's order for:  biotin  has been noted.  This product(s) is classified as an "herbal" or natural product. Due to a lack of definitive safety studies or FDA approval, nonstandard manufacturing practices, plus the potential risk of unknown drug-drug interactions while on inpatient medications, the Pharmacy and Therapeutics Committee does not permit the use of "herbal" or natural products of this type within [facility].   ACTION TAKEN: The pharmacy department is unable to verify this order at this time Please reevaluate patient's clinical condition at discharge and address if the herbal or natural product(s) should be resumed at that time.   

## 2015-10-28 LAB — BASIC METABOLIC PANEL
ANION GAP: 6 (ref 5–15)
BUN: 26 mg/dL — ABNORMAL HIGH (ref 6–20)
CHLORIDE: 109 mmol/L (ref 101–111)
CO2: 22 mmol/L (ref 22–32)
Calcium: 9.1 mg/dL (ref 8.9–10.3)
Creatinine, Ser: 1.26 mg/dL — ABNORMAL HIGH (ref 0.44–1.00)
GFR calc Af Amer: 52 mL/min — ABNORMAL LOW (ref >60)
GFR calc non Af Amer: 45 mL/min — ABNORMAL LOW (ref >60)
GLUCOSE: 83 mg/dL (ref 65–99)
POTASSIUM: 4 mmol/L (ref 3.5–5.1)
Sodium: 137 mmol/L (ref 135–145)

## 2015-10-28 LAB — CBC
HEMATOCRIT: 27.1 % — AB (ref 35.0–47.0)
HEMOGLOBIN: 9.4 g/dL — AB (ref 12.0–16.0)
MCH: 29.9 pg (ref 26.0–34.0)
MCHC: 34.6 g/dL (ref 32.0–36.0)
MCV: 86.6 fL (ref 80.0–100.0)
Platelets: 229 10*3/uL (ref 150–440)
RBC: 3.14 MIL/uL — AB (ref 3.80–5.20)
RDW: 14.5 % (ref 11.5–14.5)
WBC: 6.5 10*3/uL (ref 3.6–11.0)

## 2015-10-28 MED ORDER — VANCOMYCIN HCL IN DEXTROSE 750-5 MG/150ML-% IV SOLN
750.0000 mg | INTRAVENOUS | Status: DC
  Administered 2015-10-28: 750 mg via INTRAVENOUS
  Filled 2015-10-28: qty 150

## 2015-10-28 NOTE — Progress Notes (Signed)
Pharmacy Antibiotic Note  Ruth Price is a 60 y.o. female admitted on 10/27/2015 with osteomyelitis.  Pharmacy has been consulted for Zosyn and vancomycin dosing.  Plan: 1. Zosyn 3.375 gm IV Q8H EI 2. Vancomycin 750 mg IV x 1 in ED followed by vancomycin 1 gm IV Q36H, predicted trough 17 mcg/mL, pharmacy will continue to follow and adjust as needed to maintain trough 15 to 20 mcg/mL. Vd 31.8 L, Ke 0.029 hr-1, T1/2 23.7 hr  9/24: Scr improved. Will transition to Vancomycin 750mg  IV Q24h. TRough prior to 4th dose 9/26. Ke 0.031 t1/2 22.36 Vd 30.66.  Watch renal fxn.   Height: 5\' 5"  (165.1 cm) Weight: 96 lb 9.6 oz (43.8 kg) IBW/kg (Calculated) : 57  Temp (24hrs), Avg:98 F (36.7 C), Min:97.5 F (36.4 C), Max:98.7 F (37.1 C)   Recent Labs Lab 10/27/15 1646 10/27/15 1647 10/28/15 0412  WBC 7.4  --  6.5  CREATININE 1.40*  --  1.26*  LATICACIDVEN  --  1.3  --     Estimated Creatinine Clearance: 32 mL/min (by C-G formula based on SCr of 1.26 mg/dL (H)).    Allergies  Allergen Reactions  . Levofloxacin Other (See Comments)    Reaction:  Unknown      Thank you for allowing pharmacy to be a part of this patient's care.  Deklan Minar A, Pharm.D., BCPS Clinical Pharmacist 10/28/2015 3:10 PM

## 2015-10-28 NOTE — Consult Note (Signed)
ORTHOPAEDIC CONSULTATION  REQUESTING PHYSICIAN: Auburn Bilberry, MD  Chief Complaint: Left ankle ulceration  HPI: Ruth Price is a 60 y.o. female who complains of  Increased left ankle pain and drainage.  Chronic left ankle ulcer of greater than a year.  Increasing pain and swelling over last couple of days.  No f/c.  Ambulates with assitance.  Seen in [facility] in past.  Past Medical History:  Diagnosis Date  . Dementia   . Hypertension   . RA (rheumatoid arthritis) (HCC)    Past Surgical History:  Procedure Laterality Date  . JOINT REPLACEMENT    . SPINE SURGERY     Social History   Social History  . Marital status: Widowed    Spouse name: N/A  . Number of children: N/A  . Years of education: N/A   Social History Main Topics  . Smoking status: Never Smoker  . Smokeless tobacco: Never Used  . Alcohol use No  . Drug use: No  . Sexual activity: Not Asked   Other Topics Concern  . None   Social History Narrative  . None   Family History  Problem Relation Age of Onset  . Diabetes Mother   . Heart disease Father    Allergies  Allergen Reactions  . Levofloxacin Other (See Comments)    Reaction:  Unknown    Prior to Admission medications   Medication Sig Start Date End Date Taking? Authorizing Provider  B Complex-C (SUPER B COMPLEX PO) Take 1 capsule by mouth daily.   Yes Historical Provider, MD  Biotin 1000 MCG tablet Take 1,000 mcg by mouth daily.   Yes Historical Provider, MD  cyclobenzaprine (FLEXERIL) 10 MG tablet Take 10 mg by mouth 2 (two) times daily as needed for muscle spasms.   Yes Historical Provider, MD  folic acid (FOLVITE) 1 MG tablet Take 1 mg by mouth daily.   Yes Historical Provider, MD  Glucosamine-Chondroitin-MSM [phone] MG TABS Take 1 tablet by mouth daily.   Yes Historical Provider, MD  Multiple Vitamin (MULTIVITAMIN WITH MINERALS) TABS tablet Take 1 tablet by mouth daily.   Yes Historical Provider, MD  naproxen sodium (ANAPROX) 220 MG  tablet Take 440 mg by mouth daily with breakfast.   Yes Historical Provider, MD  omega-3 acid ethyl esters (LOVAZA) 1 g capsule Take 2 g by mouth daily.   Yes Historical Provider, MD  oxyCODONE-acetaminophen (PERCOCET/ROXICET) 5-325 MG tablet Take 1 tablet by mouth daily as needed for severe pain.   Yes Historical Provider, MD  aspirin 81 MG chewable tablet Chew 1 tablet (81 mg total) by mouth daily. Patient not taking: Reported on 10/27/2015 06/09/15   Shaune Pollack, MD  atorvastatin (LIPITOR) 40 MG tablet Take 40 mg by mouth at bedtime.    Historical Provider, MD  calcium carbonate (OSCAL) 1500 (600 Ca) MG TABS tablet Take 600 mg of elemental calcium by mouth daily with breakfast.    Historical Provider, MD  donepezil (ARICEPT) 10 MG tablet Take 10 mg by mouth at bedtime.    Historical Provider, MD  metoprolol tartrate (LOPRESSOR) 25 MG tablet Take 1 tablet (25 mg total) by mouth 2 (two) times daily. Patient not taking: Reported on 10/27/2015 06/09/15   Shaune Pollack, MD   Dg Ankle Complete Left  Result Date: 10/27/2015 CLINICAL DATA:  Nonhealing wound in the lateral left ankle for 1 year. EXAM: LEFT ANKLE COMPLETE - 3+ VIEW COMPARISON:  None. FINDINGS: Diffuse osteopenia. Vascular calcifications throughout the soft tissues. There is complete ankylosis  at the left ankle and left tarsal joints. There is soft tissue swelling with soft tissue defect in the lateral left ankle. There is underlying mild cortical erosion at the lower left lateral malleolus. There is a tiny amount of soft tissue gas deep to the soft tissue defect in the lateral left ankle. Small plantar left calcaneal spur. IMPRESSION: 1. Acute osteomyelitis in the lower left lateral malleolus with associated overlying soft tissue defect and subcutaneous emphysema. 2. 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 3. Diffuse osteopenia. Electronically Signed   By: Delbert Phenix M.D.   On: 10/27/2015 16:51   Dg Foot 2 Views Right  Result  Date: 10/27/2015 CLINICAL DATA:  Ulcer of the left ankle. Chronic ulceration between the third and fourth digits. History of rheumatoid arthritis. EXAM: RIGHT FOOT - 2 VIEW COMPARISON:  None. FINDINGS: Diffuse bone demineralization. Extension deformities of the toes. Probable erosive changes at the metatarsal-phalangeal joints although visualization is limited because of positioning. These changes are likely related to the history of rheumatoid arthritis although focal osteomyelitis is not entirely excluded. Degenerative changes in the intertarsal joints. No radiopaque soft tissue foreign bodies. No soft tissue gas collections. Vascular calcifications. No acute fractures identified. IMPRESSION: Extension deformities of the toes with probable erosive changes at the metatarsal-phalangeal joints of all toes. Changes are likely due to remote arthritis but osteomyelitis is not entirely excluded. Diffuse bone station. Electronically Signed   By: Burman Nieves M.D.   On: 10/27/2015 22:45    Positive ROS: All other systems have been reviewed and were otherwise negative with the exception of those mentioned in the HPI and as above.  12 point ROS was performed.  Physical Exam: General: Alert and oriented.  No apparent distress.  Vascular:  Left foot:Dorsalis Pedis:  absent Posterior Tibial:  absent  Right foot: Dorsalis Pedis:  absent Posterior Tibial:  absent  CFT is brisk to all digits  Neuro:intact sensation to all digits.  Derm: Right foot:  Irritataion between toes on right foot with dry drainage from toes. No open ulcerations.  Mild irritation to lateral ankle without skin breakdown.  Left ankle:  Scab overlying ~lateral malleolus removed with superficial fibrotic tissue and superficial purulence.  Upon removal mostly mixed fibrotic granular base.  Probing of wound limited secondary to pain.  No obvious gross exposure of bone.    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  Left foot/ankle:  Mild hallux valgus and hammertoes.  No ROM to midfoot or ankle secondary to ankylosis.  Xrays: Right foot: IMPRESSION: Extension deformities of the toes with probable erosive changes at the metatarsal-phalangeal joints of all toes. Changes are likely due to remote arthritis but osteomyelitis is not entirely excluded. Diffuse bone station. I agree this is consistent with Psoriatic arthritis without ulceration.  No concern for osteo on right foot.  Left ankle: IMPRESSION: 1. Acute osteomyelitis in the lower left lateral malleolus with associated overlying soft tissue defect and subcutaneous emphysema. 2. 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 3. Diffuse osteopenia. Emphysema most likely secondary to scab that was removed.  No severe necrosis and infection seems to be superficial.  Do not recommend emergent I & D as clinically does not appear to have gas producing infection.     Assessment: Left ankle osteomyelitis Psoriatic arthritis.  Plan: Bone scan ordered but would prefer MRI as  pt with severe Psoriatic Arthritis will have multiple areas of increased uptake. Wound culture performed of left ankle. Will likely need long term abx. Moon boots for now. Dressing changes written. Will re-evaluate after MRI.  If debridement needed will perform this week.       Irean Hong, DPM Cell [phone]   10/28/2015 10:03 AM

## 2015-10-28 NOTE — Progress Notes (Signed)
[facility] [place] [facility]                                                                                                                                                                                            Patient Demographics   Ruth Price, is a 60 y.o. female, DOB - 1955-10-31, QHU:[id number]  Admit date - 10/27/2015   Admitting Physician Tonye Royalty, DO  Outpatient Primary MD for the patient is ALCALA, BETH C, NP   LOS - 1  Subjective: Patient admitted with osteomyelitis of the left ankle. Seen by podiatry earlier today. Had cultures done. She is recommended to have an MRI later today. Patient denies any chest pain or shortness of breath    Review of Systems:   CONSTITUTIONAL: No documented fever. No fatigue, weakness. No weight gain, no weight loss.  EYES: No blurry or double vision.  ENT: No tinnitus. No postnasal drip. No redness of the oropharynx.  RESPIRATORY: No cough, no wheeze, no hemoptysis. No dyspnea.  CARDIOVASCULAR: No chest pain. No orthopnea. No palpitations. No syncope.  GASTROINTESTINAL: No nausea, no vomiting or diarrhea. No abdominal pain. No melena or hematochezia.  GENITOURINARY: No dysuria or hematuria.  ENDOCRINE: No polyuria or nocturia. No heat or cold intolerance.  HEMATOLOGY: No anemia. No bruising. No bleeding.  INTEGUMENTARY: No rashes. No lesions.  MUSCULOSKELETAL: Positive left ankle pain NEUROLOGIC: No numbness, tingling, or ataxia. No seizure-type activity.  PSYCHIATRIC: No anxiety. No insomnia. No ADD.    Vitals:   Vitals:   10/28/15 0758 10/28/15 1100 10/28/15 1132 10/28/15 1304  BP: 137/76  (!) 141/80 118/65  Pulse: 60 60 (!) 57 66  Resp:      Temp: 98 F (36.7 C)   98.1 F (36.7 C)  TempSrc: Oral   Oral  SpO2: 97%   99%  Weight:      Height:        Wt Readings from Last 3 Encounters:  10/27/15 96 lb 9.6 oz (43.8 kg)  06/08/15 101 lb 11.2 oz (46.1 kg)     Intake/Output  Summary (Last 24 hours) at 10/28/15 1324 Last data filed at 10/28/15 0307  Gross per 24 hour  Intake              415 ml  Output                0 ml  Net              415 ml    Physical Exam:   GENERAL: Pleasant-appearing in no apparent distress.  HEAD, EYES, EARS, NOSE AND  THROAT: Atraumatic, normocephalic. Extraocular muscles are intact. Pupils equal and reactive to light. Sclerae anicteric. No conjunctival injection. No oro-pharyngeal erythema.  NECK: Supple. There is no jugular venous distention. No bruits, no lymphadenopathy, no thyromegaly.  HEART: Regular rate and rhythm,. No murmurs, no rubs, no clicks.  LUNGS: Clear to auscultation bilaterally. No rales or rhonchi. No wheezes.  ABDOMEN: Soft, flat, nontender, nondistended. Has good bowel sounds. No hepatosplenomegaly appreciated.  EXTREMITIES: Left ankle shows scab overlying the lateral malleolus severe deformity involving her hands and legs NEUROLOGIC: The patient is alert, awake, and oriented x3 with no focal motor or sensory deficits appreciated bilaterally.  SKIN: Moist and warm with no rashes appreciated.  Psych: Not anxious, depressed LN: No inguinal LN enlargement    Antibiotics   Anti-infectives    Start     Dose/Rate Route Frequency Ordered Stop   10/28/15 1800  vancomycin (VANCOCIN) IVPB 1000 mg/200 mL premix     1,000 mg 200 mL/hr over 60 Minutes Intravenous Every 36 hours 10/27/15 2130     10/28/15 0000  piperacillin-tazobactam (ZOSYN) IVPB 3.375 g     3.375 g 12.5 mL/hr over 240 Minutes Intravenous Every 8 hours 10/27/15 2130     10/27/15 1745  vancomycin (VANCOCIN) IVPB 750 mg/150 ml premix     750 mg 150 mL/hr over 60 Minutes Intravenous  Once 10/27/15 1735 10/27/15 1853   10/27/15 1745  piperacillin-tazobactam (ZOSYN) IVPB 3.375 g     3.375 g 100 mL/hr over 30 Minutes Intravenous  Once 10/27/15 1735 10/27/15 2051      Medications   Scheduled Meds: . aspirin  81 mg Oral Daily  . atorvastatin  40  mg Oral QHS  . calcium carbonate  600 mg of elemental calcium Oral Q breakfast  . donepezil  10 mg Oral QHS  . enoxaparin (LOVENOX) injection  30 mg Subcutaneous QHS  . folic acid  1 mg Oral Daily  . multivitamin with minerals  1 tablet Oral Daily  . omega-3 acid ethyl esters  2 g Oral Daily  . piperacillin-tazobactam (ZOSYN)  IV  3.375 g Intravenous Q8H  . vancomycin  1,000 mg Intravenous Q36H   Continuous Infusions: . sodium chloride 75 mL/hr at 10/28/15 1152   PRN Meds:.acetaminophen **OR** acetaminophen, bisacodyl, HYDROcodone-acetaminophen, magnesium citrate, ondansetron **OR** ondansetron (ZOFRAN) IV, oxyCODONE-acetaminophen, senna-docusate, zolpidem   Data Review:   Micro Results Recent Results (from the past 240 hour(s))  CULTURE, BLOOD (ROUTINE X 2) w Reflex to ID Panel     Status: None (Preliminary result)   Collection Time: 10/27/15  4:46 PM  Result Value Ref Range Status   Specimen Description BLOOD RIGHT ANTECUBITAL  Final   Special Requests BOTTLES DRAWN AEROBIC AND ANAEROBIC  10CC  Final   Culture NO GROWTH < 24 HOURS  Final   Report Status PENDING  Incomplete  CULTURE, BLOOD (ROUTINE X 2) w Reflex to ID Panel     Status: None (Preliminary result)   Collection Time: 10/27/15  4:47 PM  Result Value Ref Range Status   Specimen Description BLOOD RIGHT WRIST  Final   Special Requests BOTTLES DRAWN AEROBIC AND ANAEROBIC  10CC  Final   Culture NO GROWTH < 24 HOURS  Final   Report Status PENDING  Incomplete    Radiology Reports Dg Ankle Complete Left  Result Date: 10/27/2015 CLINICAL DATA:  Nonhealing wound in the lateral left ankle for 1 year. EXAM: LEFT ANKLE COMPLETE - 3+ VIEW COMPARISON:  None. FINDINGS: Diffuse osteopenia. Vascular  calcifications throughout the soft tissues. 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 There is soft tissue swelling with soft tissue defect in the lateral left ankle. There is underlying mild cortical erosion  at the lower left lateral malleolus. There is a tiny amount of soft tissue gas deep to the soft tissue defect in the lateral left ankle. Small plantar left calcaneal spur. IMPRESSION: 1. Acute osteomyelitis in the lower left lateral malleolus with associated overlying soft tissue defect and subcutaneous emphysema. 2. 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 3. Diffuse osteopenia. Electronically Signed   By: Delbert Phenix M.D.   On: 10/27/2015 16:51   Dg Foot 2 Views Right  Result Date: 10/27/2015 CLINICAL DATA:  Ulcer of the left ankle. Chronic ulceration between the third and fourth digits. History of rheumatoid arthritis. EXAM: RIGHT FOOT - 2 VIEW COMPARISON:  None. FINDINGS: Diffuse bone demineralization. Extension deformities of the toes. Probable erosive changes at the metatarsal-phalangeal joints although visualization is limited because of positioning. These changes are likely related to the history of rheumatoid arthritis although focal osteomyelitis is not entirely excluded. Degenerative changes in the intertarsal joints. No radiopaque soft tissue foreign bodies. No soft tissue gas collections. Vascular calcifications. No acute fractures identified. IMPRESSION: Extension deformities of the toes with probable erosive changes at the metatarsal-phalangeal joints of all toes. Changes are likely due to remote arthritis but osteomyelitis is not entirely excluded. Diffuse bone station. Electronically Signed   By: Burman Nieves M.D.   On: 10/27/2015 22:45     CBC  Recent Labs Lab 10/27/15 1646 10/28/15 0412  WBC 7.4 6.5  HGB 10.8* 9.4*  HCT 31.6* 27.1*  PLT 288 229  MCV 87.0 86.6  MCH 29.6 29.9  MCHC 34.1 34.6  RDW 14.8* 14.5  LYMPHSABS 0.6*  --   MONOABS 0.5  --   EOSABS 0.1  --   BASOSABS 0.1  --     Chemistries   Recent Labs Lab 10/27/15 1646 10/28/15 0412  NA 132* 137  K 4.5 4.0  CL 101 109  CO2 24 22  GLUCOSE 122* 83  BUN 28* 26*  CREATININE 1.40*  1.26*  CALCIUM 9.2 9.1  AST 19  --   ALT 12*  --   ALKPHOS 93  --   BILITOT 0.2*  --    ------------------------------------------------------------------------------------------------------------------ estimated creatinine clearance is 32 mL/min (by C-G formula based on SCr of 1.26 mg/dL (H)). ------------------------------------------------------------------------------------------------------------------ No results for input(s): HGBA1C in the last 72 hours. ------------------------------------------------------------------------------------------------------------------ No results for input(s): CHOL, HDL, LDLCALC, TRIG, CHOLHDL, LDLDIRECT in the last 72 hours. ------------------------------------------------------------------------------------------------------------------ No results for input(s): TSH, T4TOTAL, T3FREE, THYROIDAB in the last 72 hours.  Invalid input(s): FREET3 ------------------------------------------------------------------------------------------------------------------ No results for input(s): VITAMINB12, FOLATE, FERRITIN, TIBC, IRON, RETICCTPCT in the last 72 hours.  Coagulation profile No results for input(s): INR, PROTIME in the last 168 hours.  No results for input(s): DDIMER in the last 72 hours.  Cardiac Enzymes No results for input(s): CKMB, TROPONINI, MYOGLOBIN in the last 168 hours.  Invalid input(s): CK ------------------------------------------------------------------------------------------------------------------ Invalid input(s): POCBNP    Assessment & Plan    This is a 60 y.o. female with a history of rheumatoid arthritis, psoriatic arthritis, vascular dementia, alcohol abuse now being admitted with: 1. Acute osteomyelitis of the left lateral ankle  Appreciate podiatry input They've recommend MRI of the ankle Prolonged antibiotics I will ask infectious disease to see Await cultures 2. AK I-gentle IV fluid hydration  and follow renal  function the morning 3. Anemia, chronic, monitor CBC  4. Hyponatremia, mild-improved with IV hydration 5. History of TIA/CVA-continue aspirin 6. History of vascular dementia-continue Aricept, folic acid 7. History of hypertension-continue metoprolol       Code Status Orders        Start     Ordered   10/27/15 2114  Full code  Continuous     10/27/15 2113    Code Status History    Date Active Date Inactive Code Status Order ID Comments User Context   06/08/2015  2:45 PM 06/09/2015  4:36 PM Full Code [id number]  Shaune Pollack, MD ED           Consults  Podiatry   DVT Prophylaxis  Lovenox  Lab Results  Component Value Date   PLT 229 10/28/2015     Time Spent in minutes   35 minutes Greater than 50% of time spent in care coordination and counseling patient regarding the condition and plan of care.   Auburn Bilberry M.D on 10/28/2015 at 1:24 PM  Between 7am to 6pm - Pager - [phone]  After 6pm go to [email or web address] - password [email or web address] [facility]  [facility]   Office  [phone]

## 2015-10-29 ENCOUNTER — Inpatient Hospital Stay: Payer: Commercial Managed Care - HMO

## 2015-10-29 LAB — BASIC METABOLIC PANEL
Anion gap: 3 — ABNORMAL LOW (ref 5–15)
BUN: 20 mg/dL (ref 6–20)
CHLORIDE: 111 mmol/L (ref 101–111)
CO2: 23 mmol/L (ref 22–32)
CREATININE: 1.57 mg/dL — AB (ref 0.44–1.00)
Calcium: 9 mg/dL (ref 8.9–10.3)
GFR calc non Af Amer: 34 mL/min — ABNORMAL LOW (ref >60)
GFR, EST AFRICAN AMERICAN: 40 mL/min — AB (ref >60)
Glucose, Bld: 85 mg/dL (ref 65–99)
POTASSIUM: 4.1 mmol/L (ref 3.5–5.1)
Sodium: 137 mmol/L (ref 135–145)

## 2015-10-29 MED ORDER — ALPRAZOLAM ER 0.5 MG PO TB24
0.5000 mg | ORAL_TABLET | Freq: Every day | ORAL | Status: DC
  Administered 2015-10-29 – 2015-11-02 (×3): 0.5 mg via ORAL
  Filled 2015-10-29 (×3): qty 1

## 2015-10-29 MED ORDER — LISINOPRIL 10 MG PO TABS
10.0000 mg | ORAL_TABLET | Freq: Every day | ORAL | Status: DC
  Administered 2015-10-29 – 2015-11-02 (×4): 10 mg via ORAL
  Filled 2015-10-29 (×4): qty 1

## 2015-10-29 MED ORDER — VANCOMYCIN HCL 500 MG IV SOLR
500.0000 mg | INTRAVENOUS | Status: DC
  Administered 2015-10-29 – 2015-11-01 (×4): 500 mg via INTRAVENOUS
  Filled 2015-10-29 (×6): qty 500

## 2015-10-29 NOTE — Consult Note (Signed)
WOC Nurse wound consult note Asked to consult for osteomyelitis to Left ankle and chronic ulcers to right toes.  Podiatry has consulted and orders have been written.  Will defer to podiatry services at this time.  Topical therapy and offloading orders are present.  Will not follow at this time.  Please re-consult if needed.  Maple Hudson RN BSN CWON Pager [phone]

## 2015-10-29 NOTE — Progress Notes (Signed)
Initial Nutrition Assessment  DOCUMENTATION CODES:   Severe malnutrition in context of chronic illness  INTERVENTION:  -Cater to pt preferences -Recommended oral nutrition supplements but pt reports can't drink them.  Unable to report why she can't drink them.   -Discussed ways to increase kcals and protein for wt gain and improve overall nutrition.  Pt verbalized understanding and expect good compliance.   -If unable to meet nutritional needs may need to add snacks between meals if pt agreeable.   NUTRITION DIAGNOSIS:   Malnutrition related to chronic illness as evidenced by severe depletion of body fat, severe depletion of muscle mass.    GOAL:   Patient will meet greater than or equal to 90% of their needs    MONITOR:   PO intake, Weight trends  REASON FOR ASSESSMENT:    (low BMI)    ASSESSMENT:      60 y.o female admitted with increased left ankle pain and drainage with ulcer present for > 1 year.  Pt with osteomyelitis in left lateral malleolus. MRI today.   Past medical history of dementia, HTN, RA, etoh use  Pt reports that she has a good appetite.  Typically eats eggs, maybe bacon on flaxseed bread with cheese for breakfast, eats meal of salami sandwich with cheese and/or soup between 2-3 pm and then snacks on ice cream in the evening.  Pt wanting to know how she can gain weight.  Asked if she had ever tried ensure/boost drinks, etc and pt reports no she can't drink them.  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   Tried to obtain more information about why pt can't drink supplements and unable to give reason as to why.    Nutrition-Focused physical exam completed. Findings are  Moderate to severe fat depletion, moderate to severe muscle depletion, and unable to assess edema.    Medications reviewed: folic acid, MVI, omega 3 fatty acids, NS at 58ml/hr Labs reviewed: creatinine 1.57  UOP , plus 3 times yesterday  Diet Order:  Diet regular  Room service appropriate? Yes; Fluid consistency: Thin  Skin:  Reviewed, no issues  Last BM:  9/24, noted hard stool   Height:   Ht Readings from Last 1 Encounters:  10/27/15 5\' 5"  (1.651 m)    Weight: Noted wt of 101 lb 11.2 oz on 06/08/15.  Per Care Management noted, noted pt reported wt of 84 pounds but up to 96 lb.    Wt Readings from Last 1 Encounters:  10/27/15 96 lb 9.6 oz (43.8 kg)    Ideal Body Weight:     BMI:  Body mass index is 16.08 kg/m.  Estimated Nutritional Needs:   Kcal:  1320-1540 kcals/d  Protein:  53-66 g/d  Fluid:  >/=1377ml/d  EDUCATION NEEDS:   Education needs addressed  Glora Hulgan B. 80m, RD, LDN [phone] (pager) Weekend/On-Call pager [phone])

## 2015-10-29 NOTE — Progress Notes (Addendum)
Pharmacy Antibiotic Note  Ruth Price is a 60 y.o. female admitted on 10/27/2015 with osteomyelitis.  Pharmacy has been consulted for Zosyn and vancomycin dosing.  Plan: Scr has worsened. CrCl=25.47ml/min. Will decrease dose to 500mg  q 24 hours. Check trough prior to the 4th new dose. 9/28 @ 1930.  Continue zosyn 3.375g q 8hr EI dosing.  Continue to monitor renal function closely.    Height: 5\' 5"  (165.1 cm) Weight: 96 lb 9.6 oz (43.8 kg) IBW/kg (Calculated) : 57  Temp (24hrs), Avg:98 F (36.7 C), Min:97.8 F (36.6 C), Max:98.1 F (36.7 C)   Recent Labs Lab 10/27/15 1646 10/27/15 1647 10/28/15 0412 10/29/15 0411  WBC 7.4  --  6.5  --   CREATININE 1.40*  --  1.26* 1.57*  LATICACIDVEN  --  1.3  --   --     Estimated Creatinine Clearance: 25.7 mL/min (by C-G formula based on SCr of 1.57 mg/dL (H)).    Allergies  Allergen Reactions  . Levofloxacin Other (See Comments)    Reaction:  Unknown      Thank you for allowing pharmacy to be a part of this patient's care.  10/30/15, Pharm.D. Clinical Pharmacist 10/29/2015 7:42 AM

## 2015-10-29 NOTE — Care Management (Signed)
Admitted to [facility] with the diagnosis of osteomyelitis. Lives with son Jomarie Longs [phone]) Last seen Dr, Elmer Ramp in July 2017. Discharged from [facility] July 13th. Home health in the home last October. Can't remember name of agency. "Thinking about getting [facility] in the home, daughter-in-law can't help, she has 3 kids," No falls. Appetite is better. "I usually eat alone, my daughter-in-law doesn't know what to fix for me." Did weight 84 lbs now 96 lbs.  Prescriptions are filled at [facility] in [place]. States she hasn't taken any medications since being discharged from [facility]. "They went to the wrong pharmacy." Can't bath myself, hasn't had a bath for 2 months. Raised toilet seat and reacher in the home. Son will transport. Gwenette Greet RN MSN CCM Care Management [phone]

## 2015-10-29 NOTE — Care Management Important Message (Signed)
Important Message  Patient Details  Name: Ruth Price MRN: [id number] Date of Birth: 08/30/1955   Medicare Important Message Given:  Yes    Gwenette Greet, RN 10/29/2015, 8:48 AM

## 2015-10-29 NOTE — Progress Notes (Signed)
MRI reviewed and consistent with osteomyelitis.  Showed partial fusion of tibio-talar joint.  Will obtain CT of ankle to further evaluate extent of fusion as debridement of bone could destabilize ankle joint if not solidly fused.  Will d/w pt options of wound care and long term antibiotics +/- debridement of bone.  Recommend ID consult for long term IV abx.

## 2015-10-29 NOTE — Progress Notes (Signed)
[facility] - [place] at [facility] [place]                                                                                                                                                                                            Patient Demographics   Scherrie Seneca, is a 60 y.o. female, DOB - [birth year], YTK:[id number]  Admit date - 10/27/2015   Admitting Physician Tonye Royalty, DO  Outpatient Primary MD for the patient is ALCALA, BETH C, NP   LOS - 2  Subjective: MRI shows osteomyelitis, denies any cp or sob  Review of Systems:   CONSTITUTIONAL: No documented fever. No fatigue, weakness. No weight gain, no weight loss.  EYES: No blurry or double vision.  ENT: No tinnitus. No postnasal drip. No redness of the oropharynx.  RESPIRATORY: No cough, no wheeze, no hemoptysis. No dyspnea.  CARDIOVASCULAR: No chest pain. No orthopnea. No palpitations. No syncope.  GASTROINTESTINAL: No nausea, no vomiting or diarrhea. No abdominal pain. No melena or hematochezia.  GENITOURINARY: No dysuria or hematuria.  ENDOCRINE: No polyuria or nocturia. No heat or cold intolerance.  HEMATOLOGY: No anemia. No bruising. No bleeding.  INTEGUMENTARY: No rashes. No lesions.  MUSCULOSKELETAL: Positive left ankle pain NEUROLOGIC: No numbness, tingling, or ataxia. No seizure-type activity.  PSYCHIATRIC: No anxiety. No insomnia. No ADD.    Vitals:   Vitals:   10/29/15 0445 10/29/15 0800 10/29/15 1027 10/29/15 1300  BP: (!) 150/68 (!) 146/76 (!) 158/71 (!) 171/74  Pulse: 63 70 61 72  Resp: 18  18 18   Temp: 97.8 F (36.6 C) 98.4 F (36.9 C) 98 F (36.7 C) 97.5 F (36.4 C)  TempSrc: Oral Oral Oral Oral  SpO2: 97% 100% 97%   Weight:      Height:        Wt Readings from Last 3 Encounters:  10/27/15 96 lb 9.6 oz (43.8 kg)  06/08/15 101 lb 11.2 oz (46.1 kg)     Intake/Output Summary (Last 24 hours) at 10/29/15 1454 Last data filed at 10/29/15 1300  Gross per 24 hour  Intake           2186.25 ml  Output              900 ml  Net          1286.25 ml    Physical Exam:   GENERAL: Pleasant-appearing in no apparent distress.  HEAD, EYES, EARS, NOSE AND THROAT: Atraumatic, normocephalic. Extraocular muscles are intact. Pupils equal and reactive to light. Sclerae anicteric. No conjunctival injection. No oro-pharyngeal erythema.  NECK: Supple. There is no jugular venous distention.  No bruits, no lymphadenopathy, no thyromegaly.  HEART: Regular rate and rhythm,. No murmurs, no rubs, no clicks.  LUNGS: Clear to auscultation bilaterally. No rales or rhonchi. No wheezes.  ABDOMEN: Soft, flat, nontender, nondistended. Has good bowel sounds. No hepatosplenomegaly appreciated.  EXTREMITIES: Left ankle dressing over lateral malleolus severe deformity involving her hands and legs NEUROLOGIC: The patient is alert, awake, and oriented x3 with no focal motor or sensory deficits appreciated bilaterally.  SKIN: Moist and warm with no rashes appreciated.  Psych: Not anxious, depressed LN: No inguinal LN enlargement    Antibiotics   Anti-infectives    Start     Dose/Rate Route Frequency Ordered Stop   10/29/15 2000  vancomycin (VANCOCIN) 500 mg in sodium chloride 0.9 % 100 mL IVPB     500 mg 100 mL/hr over 60 Minutes Intravenous Every 24 hours 10/29/15 0742     10/28/15 2000  vancomycin (VANCOCIN) IVPB 750 mg/150 ml premix  Status:  Discontinued     750 mg 150 mL/hr over 60 Minutes Intravenous Every 24 hours 10/28/15 1510 10/29/15 0742   10/28/15 1800  vancomycin (VANCOCIN) IVPB 1000 mg/200 mL premix  Status:  Discontinued     1,000 mg 200 mL/hr over 60 Minutes Intravenous Every 36 hours 10/27/15 2130 10/28/15 1510   10/28/15 0000  piperacillin-tazobactam (ZOSYN) IVPB 3.375 g     3.375 g 12.5 mL/hr over 240 Minutes Intravenous Every 8 hours 10/27/15 2130     10/27/15 1745  vancomycin (VANCOCIN) IVPB 750 mg/150 ml premix     750 mg 150 mL/hr over 60 Minutes Intravenous  Once  10/27/15 1735 10/27/15 1853   10/27/15 1745  piperacillin-tazobactam (ZOSYN) IVPB 3.375 g     3.375 g 100 mL/hr over 30 Minutes Intravenous  Once 10/27/15 1735 10/27/15 2051      Medications   Scheduled Meds: . ALPRAZolam  0.5 mg Oral Daily  . aspirin  81 mg Oral Daily  . atorvastatin  40 mg Oral QHS  . calcium carbonate  600 mg of elemental calcium Oral Q breakfast  . donepezil  10 mg Oral QHS  . enoxaparin (LOVENOX) injection  30 mg Subcutaneous QHS  . folic acid  1 mg Oral Daily  . lisinopril  10 mg Oral Daily  . multivitamin with minerals  1 tablet Oral Daily  . omega-3 acid ethyl esters  2 g Oral Daily  . piperacillin-tazobactam (ZOSYN)  IV  3.375 g Intravenous Q8H  . vancomycin  500 mg Intravenous Q24H   Continuous Infusions: . sodium chloride 75 mL/hr at 10/29/15 0239   PRN Meds:.acetaminophen **OR** acetaminophen, bisacodyl, HYDROcodone-acetaminophen, magnesium citrate, ondansetron **OR** ondansetron (ZOFRAN) IV, oxyCODONE-acetaminophen, senna-docusate, zolpidem   Data Review:   Micro Results Recent Results (from the past 240 hour(s))  CULTURE, BLOOD (ROUTINE X 2) w Reflex to ID Panel     Status: None (Preliminary result)   Collection Time: 10/27/15  4:46 PM  Result Value Ref Range Status   Specimen Description BLOOD RIGHT ANTECUBITAL  Final   Special Requests BOTTLES DRAWN AEROBIC AND ANAEROBIC  10CC  Final   Culture NO GROWTH < 24 HOURS  Final   Report Status PENDING  Incomplete  CULTURE, BLOOD (ROUTINE X 2) w Reflex to ID Panel     Status: None (Preliminary result)   Collection Time: 10/27/15  4:47 PM  Result Value Ref Range Status   Specimen Description BLOOD RIGHT WRIST  Final   Special Requests BOTTLES DRAWN AEROBIC AND ANAEROBIC  10CC  Final   Culture NO GROWTH < 24 HOURS  Final   Report Status PENDING  Incomplete  Aerobic/Anaerobic Culture (surgical/deep wound)     Status: None (Preliminary result)   Collection Time: 10/28/15 10:24 AM  Result Value Ref  Range Status   Specimen Description ANKLE  Final   Special Requests NONE  Final   Gram Stain   Final    RARE WBC PRESENT, PREDOMINANTLY PMN RARE GRAM POSITIVE COCCI IN PAIRS    Culture   Final    CULTURE REINCUBATED FOR BETTER GROWTH Performed at [facility]    Report Status PENDING  Incomplete    Radiology Reports Dg Ankle Complete Left  Result Date: 10/27/2015 CLINICAL DATA:  Nonhealing wound in the lateral left ankle for 1 year. EXAM: LEFT ANKLE COMPLETE - 3+ VIEW COMPARISON:  None. FINDINGS: Diffuse osteopenia. Vascular calcifications throughout the soft tissues. 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 There is soft tissue swelling with soft tissue defect in the lateral left ankle. There is underlying mild cortical erosion at the lower left lateral malleolus. There is a tiny amount of soft tissue gas deep to the soft tissue defect in the lateral left ankle. Small plantar left calcaneal spur. IMPRESSION: 1. Acute osteomyelitis in the lower left lateral malleolus with associated overlying soft tissue defect and subcutaneous emphysema. 2. 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 3. Diffuse osteopenia. Electronically Signed   By: Delbert Phenix M.D.   On: 10/27/2015 16:51   Mr Ankle Left  Wo Contrast  Result Date: 10/29/2015 CLINICAL DATA:  Chronic left ankle ulcer.  Persistent drainage. EXAM: MRI OF THE LEFT ANKLE WITHOUT CONTRAST TECHNIQUE: Multiplanar, multisequence MR imaging of the ankle was performed. No intravenous contrast was administered. COMPARISON:  Radiographs 10/27/2015 FINDINGS: TENDONS Peroneal: Intact. Posteromedial: Intact. Anterior: Intact. Achilles: Intact. Plantar Fascia: Intact. LIGAMENTS Lateral: Intact. Medial: Intact. CARTILAGE Ankle Joint: The tibiotalar joint is fused. Subtalar Joints/Sinus Tarsi: The subtalar joints are fused and the midfoot joints are fused. The tarsal metatarsal joints are not fused but are  severely degenerated. Bones: Edema like signal abnormality in the lateral malleolus consistent with osteomyelitis. There is overlying soft tissue swelling and probable granulation tissue but no discrete drainable abscess. No other areas of osteomyelitis are identified. Other: Mild diffuse fatty atrophy of the foot musculature. No findings for myofasciitis. IMPRESSION: 1. Osteomyelitis involving the distal fibula. Overlying soft tissue swelling and probable granulation tissue but no discrete drainable soft tissue abscess. 2. No findings for septic arthritis. 3. Tibiotalar, subtalar and midfoot fusions. Electronically Signed   By: Rudie Meyer M.D.   On: 10/29/2015 10:14   Dg Foot 2 Views Right  Result Date: 10/27/2015 CLINICAL DATA:  Ulcer of the left ankle. Chronic ulceration between the third and fourth digits. History of rheumatoid arthritis. EXAM: RIGHT FOOT - 2 VIEW COMPARISON:  None. FINDINGS: Diffuse bone demineralization. Extension deformities of the toes. Probable erosive changes at the metatarsal-phalangeal joints although visualization is limited because of positioning. These changes are likely related to the history of rheumatoid arthritis although focal osteomyelitis is not entirely excluded. Degenerative changes in the intertarsal joints. No radiopaque soft tissue foreign bodies. No soft tissue gas collections. Vascular calcifications. No acute fractures identified. IMPRESSION: Extension deformities of the toes with probable erosive changes at the metatarsal-phalangeal joints of all toes. Changes are likely due to remote arthritis but osteomyelitis is not entirely excluded. Diffuse bone station. Electronically Signed  By: Burman NievesWilliam  Stevens M.D.   On: 10/27/2015 22:45     CBC  Recent Labs Lab 10/27/15 1646 10/28/15 0412  WBC 7.4 6.5  HGB 10.8* 9.4*  HCT 31.6* 27.1*  PLT 288 229  MCV 87.0 86.6  MCH 29.6 29.9  MCHC 34.1 34.6  RDW 14.8* 14.5  LYMPHSABS 0.6*  --   MONOABS 0.5  --    EOSABS 0.1  --   BASOSABS 0.1  --     Chemistries   Recent Labs Lab 10/27/15 1646 10/28/15 0412 10/29/15 0411  NA 132* 137 137  K 4.5 4.0 4.1  CL 101 109 111  CO2 24 22 23   GLUCOSE 122* 83 85  BUN 28* 26* 20  CREATININE 1.40* 1.26* 1.57*  CALCIUM 9.2 9.1 9.0  AST 19  --   --   ALT 12*  --   --   ALKPHOS 93  --   --   BILITOT 0.2*  --   --    ------------------------------------------------------------------------------------------------------------------ estimated creatinine clearance is 25.7 mL/min (by C-G formula based on SCr of 1.57 mg/dL (H)). ------------------------------------------------------------------------------------------------------------------ No results for input(s): HGBA1C in the last 72 hours. ------------------------------------------------------------------------------------------------------------------ No results for input(s): CHOL, HDL, LDLCALC, TRIG, CHOLHDL, LDLDIRECT in the last 72 hours. ------------------------------------------------------------------------------------------------------------------ No results for input(s): TSH, T4TOTAL, T3FREE, THYROIDAB in the last 72 hours.  Invalid input(s): FREET3 ------------------------------------------------------------------------------------------------------------------ No results for input(s): VITAMINB12, FOLATE, FERRITIN, TIBC, IRON, RETICCTPCT in the last 72 hours.  Coagulation profile No results for input(s): INR, PROTIME in the last 168 hours.  No results for input(s): DDIMER in the last 72 hours.  Cardiac Enzymes No results for input(s): CKMB, TROPONINI, MYOGLOBIN in the last 168 hours.  Invalid input(s): CK ------------------------------------------------------------------------------------------------------------------ Invalid input(s): POCBNP    Assessment & Plan    This is a 60 y.o. female with a history of rheumatoid arthritis, psoriatic arthritis, vascular dementia, alcohol  abuse now being admitted with: 1. Acute osteomyelitis of the left lateral ankle  Appreciate podiatry input MRI confirms osteomyelitis Prolonged antibiotics I will ask infectious disease to see tomorrow Will have PICC line placed 2. AK I-continue to monitor  3. Anemia, chronic, monitor CBC  4. Hyponatremia, mild-improved with IV hydration 5. History of TIA/CVA-continue aspirin 6. History of vascular dementia-continue Aricept, folic acid 7. History of hypertension-continue metoprolol       Code Status Orders        Start     Ordered   10/27/15 2114  Full code  Continuous     10/27/15 2113    Code Status History    Date Active Date Inactive Code Status Order ID Comments User Context   06/08/2015  2:45 PM 06/09/2015  4:36 PM Full Code [id number]  Shaune PollackQing Chen, MD ED           Consults  Podiatry   DVT Prophylaxis  Lovenox  Lab Results  Component Value Date   PLT 229 10/28/2015     Time Spent in minutes   32 minutes Greater than 50% of time spent in care coordination and counseling patient regarding the condition and plan of care.   Auburn BilberryPATEL, Lazaro Isenhower M.D on 10/29/2015 at 2:54 PM  Between 7am to 6pm - Pager - [phone]  After 6pm go to [email or web address] - password [email or web address] [facility]  [facility]   Office  [phone]

## 2015-10-30 DIAGNOSIS — E43 Unspecified severe protein-calorie malnutrition: Secondary | ICD-10-CM | POA: Insufficient documentation

## 2015-10-30 LAB — CREATININE, SERUM
Creatinine, Ser: 1.34 mg/dL — ABNORMAL HIGH (ref 0.44–1.00)
GFR calc Af Amer: 48 mL/min — ABNORMAL LOW (ref >60)
GFR calc non Af Amer: 41 mL/min — ABNORMAL LOW (ref >60)

## 2015-10-30 MED ORDER — SALINE SPRAY 0.65 % NA SOLN
1.0000 | NASAL | Status: DC | PRN
  Administered 2015-10-30: 1 via NASAL
  Filled 2015-10-30: qty 44

## 2015-10-30 NOTE — Consult Note (Signed)
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 female with RA, HTN admitted with a chronic ulcer of L ankle with increasing pain and drainage. On admit she was AF and had nml wbc bt CT and MRI showed osteomyelitis of fibula.  She had been following at wound care center and apparently was recently at an etoh rehab center. Culture from the wound has grown enterobacter but GS also with GPC in pairs. She has been on vanco and zosyn  Past Medical History:  Diagnosis Date  . Dementia   . Hypertension   . RA (rheumatoid arthritis) ([place])    Past Surgical History:  Procedure Laterality Date  . JOINT REPLACEMENT    . SPINE SURGERY     Social History  Substance Use Topics  . Smoking status: Never Smoker  . Smokeless tobacco: Never Used  . Alcohol use No   Family History  Problem Relation Age of Onset  . Diabetes Mother   . Heart disease Father     Allergies:  Allergies  Allergen Reactions  . Levofloxacin Other (See Comments)    Reaction:  Unknown     Current antibiotics: Antibiotics Given (last 72 hours)    Date/Time Action Medication Dose Rate   10/27/15 1753 Given   vancomycin (VANCOCIN) IVPB 750 mg/150 ml premix 750 mg 150 mL/hr   10/28/15 0256 Given   piperacillin-tazobactam (ZOSYN) IVPB 3.375 g 3.375 g 12.5 mL/hr   10/28/15 0955 Given   piperacillin-tazobactam (ZOSYN) IVPB 3.375 g 3.375 g 12.5 mL/hr   10/28/15 1813 Given   piperacillin-tazobactam (ZOSYN) IVPB 3.375 g 3.375 g 12.5 mL/hr   10/28/15 2013 Given   vancomycin (VANCOCIN) IVPB 750 mg/150 ml premix 750 mg 150 mL/hr   10/29/15 0133 Given   piperacillin-tazobactam (ZOSYN) IVPB 3.375 g 3.375 g 12.5 mL/hr   10/29/15 1053 Given   piperacillin-tazobactam (ZOSYN) IVPB 3.375 g 3.375 g 12.5 mL/hr   10/29/15  1751 Given   piperacillin-tazobactam (ZOSYN) IVPB 3.375 g 3.375 g 12.5 mL/hr   10/29/15 2000 Given   vancomycin (VANCOCIN) 500 mg in sodium chloride 0.9 % 100 mL IVPB 500 mg 100 mL/hr   10/30/15 0144 Given   piperacillin-tazobactam (ZOSYN) IVPB 3.375 g 3.375 g 12.5 mL/hr   10/30/15 0930 Given   piperacillin-tazobactam (ZOSYN) IVPB 3.375 g 3.375 g 12.5 mL/hr      MEDICATIONS: . ALPRAZolam  0.5 mg Oral Daily  . aspirin  81 mg Oral Daily  . atorvastatin  40 mg Oral QHS  . calcium carbonate  600 mg of elemental calcium Oral Q breakfast  . donepezil  10 mg Oral QHS  . enoxaparin (LOVENOX) injection  30 mg Subcutaneous QHS  . folic acid  1 mg Oral Daily  . lisinopril  10 mg Oral Daily  . multivitamin with minerals  1 tablet Oral Daily  . omega-3 acid ethyl esters  2 g Oral Daily  . piperacillin-tazobactam (ZOSYN)  IV  3.375 g Intravenous Q8H  . vancomycin  500 mg Intravenous Q24H    Review of Systems - 11 systems reviewed and negative per HPI   OBJECTIVE: Temp:  [97.5 F (36.4 C)-98 F (36.7 C)] 97.5 F (36.4 C) (09/26 1204) Pulse Rate:  [60-70] 67 (09/26 1204) Resp:  [16-18] 16 (09/26 1204) BP: (  137-161)/(53-80) 161/60 (09/26 1204) SpO2:  [98 %-100 %] 100 % (09/26 1204) Physical Exam  Constitutional:  Chronically ill appearing, older then stated age oriented to person, place, and time.HENT: [place]/AT, PERRLA, no scleral icterus Mouth/Throat: Oropharynx is clear and moist. No oropharyngeal exudate.  Cardiovascular: Normal rate, regular rhythm and normal heart sounds. Pulmonary/Chest: Effort normal and breath sounds normal. No respiratory distress.  has no wheezes.  Neck = supple, no nuchal rigidity Abdominal: Soft. Bowel sounds are normal.  exhibits no distension. There is no tenderness.  Lymphadenopathy: no cervical adenopathy. No axillary adenopathy Neurological: alert and oriented to person, place, and time.  Skin:L lateral ankle with a 2 cm ulcer with surrounding redness, mod  tan drainage, ttp Psychiatric: a normal mood and affect.  behavior is normal.    LABS: Results for orders placed or performed during the hospital encounter of 10/27/15 (from the past 48 hour(s))  Basic metabolic panel     Status: Abnormal   Collection Time: 10/29/15  4:11 AM  Result Value Ref Range   Sodium 137 135 - 145 mmol/L   Potassium 4.1 3.5 - 5.1 mmol/L   Chloride 111 101 - 111 mmol/L   CO2 23 22 - 32 mmol/L   Glucose, Bld 85 65 - 99 mg/dL   BUN 20 6 - 20 mg/dL   Creatinine, Ser 1.57 (H) 0.44 - 1.00 mg/dL   Calcium 9.0 8.9 - 10.3 mg/dL   GFR calc non Af Amer 34 (L) >60 mL/min   GFR calc Af Amer 40 (L) >60 mL/min    Comment: (NOTE) The eGFR has been calculated using the CKD EPI equation. This calculation has not been validated in all clinical situations. eGFR's persistently <60 mL/min signify possible Chronic Kidney Disease.    Anion gap 3 (L) 5 - 15  Creatinine, serum     Status: Abnormal   Collection Time: 10/30/15  3:37 AM  Result Value Ref Range   Creatinine, Ser 1.34 (H) 0.44 - 1.00 mg/dL   GFR calc non Af Amer 41 (L) >60 mL/min   GFR calc Af Amer 48 (L) >60 mL/min    Comment: (NOTE) The eGFR has been calculated using the CKD EPI equation. This calculation has not been validated in all clinical situations. eGFR's persistently <60 mL/min signify possible Chronic Kidney Disease.    No components found for: ESR, C REACTIVE PROTEIN MICRO: Recent Results (from the past 720 hour(s))  CULTURE, BLOOD (ROUTINE X 2) w Reflex to ID Panel     Status: None (Preliminary result)   Collection Time: 10/27/15  4:46 PM  Result Value Ref Range Status   Specimen Description BLOOD RIGHT ANTECUBITAL  Final   Special Requests BOTTLES DRAWN AEROBIC AND ANAEROBIC  10CC  Final   Culture NO GROWTH 3 DAYS  Final   Report Status PENDING  Incomplete  CULTURE, BLOOD (ROUTINE X 2) w Reflex to ID Panel     Status: None (Preliminary result)   Collection Time: 10/27/15  4:47 PM  Result  Value Ref Range Status   Specimen Description BLOOD RIGHT WRIST  Final   Special Requests BOTTLES DRAWN AEROBIC AND ANAEROBIC  10CC  Final   Culture NO GROWTH 3 DAYS  Final   Report Status PENDING  Incomplete  Aerobic/Anaerobic Culture (surgical/deep wound)     Status: None (Preliminary result)   Collection Time: 10/28/15 10:24 AM  Result Value Ref Range Status   Specimen Description ANKLE  Final   Special Requests NONE  Final  Gram Stain   Final    RARE WBC PRESENT, PREDOMINANTLY PMN RARE GRAM POSITIVE COCCI IN PAIRS Performed at [facility]    Culture   Final    FEW ENTEROBACTER AEROGENES NO ANAEROBES ISOLATED; CULTURE IN PROGRESS FOR 5 DAYS    Report Status PENDING  Incomplete   Organism ID, Bacteria ENTEROBACTER AEROGENES  Final      Susceptibility   Enterobacter aerogenes - MIC*    CEFAZOLIN >=64 RESISTANT Resistant     CEFEPIME <=1 SENSITIVE Sensitive     CEFTAZIDIME <=1 SENSITIVE Sensitive     CEFTRIAXONE <=1 SENSITIVE Sensitive     CIPROFLOXACIN <=0.25 SENSITIVE Sensitive     GENTAMICIN <=1 SENSITIVE Sensitive     IMIPENEM 1 SENSITIVE Sensitive     TRIMETH/SULFA <=20 SENSITIVE Sensitive     PIP/TAZO <=4 SENSITIVE Sensitive     * FEW ENTEROBACTER AEROGENES    IMAGING: Dg Ankle Complete Left  Result Date: 10/27/2015 CLINICAL DATA:  Nonhealing wound in the lateral left ankle for 1 year. EXAM: LEFT ANKLE COMPLETE - 3+ VIEW COMPARISON:  None. FINDINGS: Diffuse osteopenia. Vascular calcifications throughout the soft tissues. 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 There is soft tissue swelling with soft tissue defect in the lateral left ankle. There is underlying mild cortical erosion at the lower left lateral malleolus. There is a tiny amount of soft tissue gas deep to the soft tissue defect in the lateral left ankle. Small plantar left calcaneal spur. IMPRESSION: 1. Acute osteomyelitis in the lower left lateral malleolus with associated  overlying soft tissue defect and subcutaneous emphysema. 2. 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 3. Diffuse osteopenia. Electronically Signed   By: Ilona Sorrel M.D.   On: 10/27/2015 16:51   Ct Ankle Left Wo Contrast  Result Date: 10/29/2015 CLINICAL DATA:  Distal fibular osteomyelitis with overlying soft tissue swelling and possible abscess. EXAM: CT OF THE LEFT ANKLE WITHOUT CONTRAST TECHNIQUE: Multidetector CT imaging of the left ankle was performed according to the standard protocol. Multiplanar CT image reconstructions were also generated. COMPARISON:  10/29/2015 MRI FINDINGS: Bones/Joint/Cartilage MRI confirms an erosion along the inferior fibular tip were there is extensive spurring, corresponding to the focus of edema and cortical destruction on the MRI. This cortical defect is shown on image 72/5 and on image 72/6. Solid hindfoot and midfoot and midfoot fusion noted. Severe arthropathy at the Lisfranc joint, with fusion of the bases of the fourth and fifth metatarsals and questionable partial fusion of the bases of the second and third metatarsals. Plantar calcaneal spur. On image 125/8 there is a chronic nonunited fracture of the distal fifth metatarsal. Ligaments Suboptimally assessed by CT. Muscles and Tendons Grossly unremarkable. Soft tissues Plantar edema along the heel, especially below the plantar spur. Vascular calcifications noted. IMPRESSION: 1. Erosion laterally along the inferior fibular tip corresponding to the defect on MRI, and suspicious for focal osteomyelitis. 2. 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 Advanced arthropathy at the Lisfranc joint. 3. Chronic nonunited fracture of the distal fifth metatarsal. 4. Plantar calcaneal spur, with underlying edema in the subcutaneous tissues potentially from plantar fasciitis. 5. Atherosclerotic vascular calcifications. Electronically Signed   By: Van Clines M.D.   On: 10/29/2015 19:52   Mr Ankle Left  Wo Contrast  Result Date: 10/29/2015 CLINICAL DATA:  Chronic left ankle ulcer.  Persistent drainage. EXAM: MRI OF THE  LEFT ANKLE WITHOUT CONTRAST TECHNIQUE: Multiplanar, multisequence MR imaging of the ankle was performed. No intravenous contrast was administered. COMPARISON:  Radiographs 10/27/2015 FINDINGS: TENDONS Peroneal: Intact. Posteromedial: Intact. Anterior: Intact. Achilles: Intact. Plantar Fascia: Intact. LIGAMENTS Lateral: Intact. Medial: Intact. CARTILAGE Ankle Joint: The tibiotalar joint is fused. Subtalar Joints/Sinus Tarsi: The subtalar joints are fused and the midfoot joints are fused. The tarsal metatarsal joints are not fused but are severely degenerated. Bones: Edema like signal abnormality in the lateral malleolus consistent with osteomyelitis. There is overlying soft tissue swelling and probable granulation tissue but no discrete drainable abscess. No other areas of osteomyelitis are identified. Other: Mild diffuse fatty atrophy of the foot musculature. No findings for myofasciitis. IMPRESSION: 1. Osteomyelitis involving the distal fibula. Overlying soft tissue swelling and probable granulation tissue but no discrete drainable soft tissue abscess. 2. No findings for septic arthritis. 3. Tibiotalar, subtalar and midfoot fusions. Electronically Signed   By: Marijo Sanes M.D.   On: 10/29/2015 10:14   Dg Chest Port 1 View  Result Date: 10/29/2015 CLINICAL DATA:  PICC line placement EXAM: PORTABLE CHEST 1 VIEW COMPARISON:  06/08/2015 FINDINGS: Borderline cardiomegaly. There is small left pleural effusion with left basilar atelectasis or infiltrate. Right arm PICC line with tip in SVC right atrium. No pneumothorax. No pulmonary edema. Degenerative changes bilateral shoulders. IMPRESSION: Right arm PICC line in place. No pneumothorax. Small left pleural effusion with left basilar atelectasis or infiltrate. No pulmonary edema.  Electronically Signed   By: Lahoma Crocker M.D.   On: 10/29/2015 17:04   Dg Foot 2 Views Right  Result Date: 10/27/2015 CLINICAL DATA:  Ulcer of the left ankle. Chronic ulceration between the third and fourth digits. History of rheumatoid arthritis. EXAM: RIGHT FOOT - 2 VIEW COMPARISON:  None. FINDINGS: Diffuse bone demineralization. Extension deformities of the toes. Probable erosive changes at the metatarsal-phalangeal joints although visualization is limited because of positioning. These changes are likely related to the history of rheumatoid arthritis although focal osteomyelitis is not entirely excluded. Degenerative changes in the intertarsal joints. No radiopaque soft tissue foreign bodies. No soft tissue gas collections. Vascular calcifications. No acute fractures identified. IMPRESSION: Extension deformities of the toes with probable erosive changes at the metatarsal-phalangeal joints of all toes. Changes are likely due to remote arthritis but osteomyelitis is not entirely excluded. Diffuse bone station. Electronically Signed   By: Lucienne Capers M.D.   On: 10/27/2015 22:45    Assessment:   Montasia Chisenhall is a 60 y.o. female with advanced RA not on immunosuppressants admitted with worsening L lateral ankle wound and increased drainage with MRI and CT confirmng osteomyelitis and cx mixed but with enterobacter. For surgery 9/27  Recommendations Check ESR CRP Send bone for culture if possible I think she will need IV abx for 6 weeks so would place PICC  Will plan to use zosyn unless cultures reveal resistant pathogens  Thank you very much for allowing me to participate in the care of this patient. Please call with questions.   Cheral Marker. Ola Spurr, MD

## 2015-10-30 NOTE — Progress Notes (Signed)
Ch responded to Consult for Pt who was feeling afraid about her upcoming surgery. We spent some time exploring her fear and provided a prayer for strength, courage, and a tangible way to experience God's presence. I will follow up with the Pt after her surgery on 10/31/15. The Pt seemed calmer after our visit. [facility] services are available 24/7 for follow up as needed.     10/30/15 1100  Clinical Encounter Type  Visited With Patient  Visit Type Initial;Spiritual support;Pre-op  Referral From Nurse  Spiritual Encounters  Spiritual Needs Prayer;Emotional  Stress Factors  Patient Stress Factors Family relationships;Health changes;Major life changes

## 2015-10-30 NOTE — Progress Notes (Signed)
[facility] [place] at [facility]                                                                                                                                                                                            Patient Demographics   Ruth Price, is a 60 y.o. female, DOB - 09-05-1955, SWF:[id number]  Admit date - 10/27/2015   Admitting Physician Tonye Royalty, DO  Outpatient Primary MD for the patient is ALCALA, BETH C, NP   LOS - 3  Subjective: Patient's plan to go to the OR tomorrow  Review of Systems:   CONSTITUTIONAL: No documented fever. No fatigue, weakness. No weight gain, no weight loss.  EYES: No blurry or double vision.  ENT: No tinnitus. No postnasal drip. No redness of the oropharynx.  RESPIRATORY: No cough, no wheeze, no hemoptysis. No dyspnea.  CARDIOVASCULAR: No chest pain. No orthopnea. No palpitations. No syncope.  GASTROINTESTINAL: No nausea, no vomiting or diarrhea. No abdominal pain. No melena or hematochezia.  GENITOURINARY: No dysuria or hematuria.  ENDOCRINE: No polyuria or nocturia. No heat or cold intolerance.  HEMATOLOGY: No anemia. No bruising. No bleeding.  INTEGUMENTARY: No rashes. No lesions.  MUSCULOSKELETAL: Positive left ankle pain NEUROLOGIC: No numbness, tingling, or ataxia. No seizure-type activity.  PSYCHIATRIC: No anxiety. No insomnia. No ADD.    Vitals:   Vitals:   10/29/15 1959 10/30/15 0616 10/30/15 0859 10/30/15 1204  BP: (!) 137/53 (!) 147/80 (!) 145/67 (!) 161/60  Pulse: 60 70 61 67  Resp: 18 18  16   Temp: 98 F (36.7 C) 98 F (36.7 C) 97.9 F (36.6 C) 97.5 F (36.4 C)  TempSrc: Oral Oral Oral Oral  SpO2: 99% 98% 100% 100%  Weight:      Height:        Wt Readings from Last 3 Encounters:  10/27/15 96 lb 9.6 oz (43.8 kg)  06/08/15 101 lb 11.2 oz (46.1 kg)     Intake/Output Summary (Last 24 hours) at 10/30/15 1401 Last data filed at 10/30/15 0800  Gross per 24 hour  Intake            2712.5 ml  Output              650 ml  Net           2062.5 ml    Physical Exam:   GENERAL: Pleasant-appearing in no apparent distress.  HEAD, EYES, EARS, NOSE AND THROAT: Atraumatic, normocephalic. Extraocular muscles are intact. Pupils equal and reactive to light. Sclerae anicteric. No conjunctival injection. No oro-pharyngeal erythema.  NECK: Supple. There is no jugular  venous distention. No bruits, no lymphadenopathy, no thyromegaly.  HEART: Regular rate and rhythm,. No murmurs, no rubs, no clicks.  LUNGS: Clear to auscultation bilaterally. No rales or rhonchi. No wheezes.  ABDOMEN: Soft, flat, nontender, nondistended. Has good bowel sounds. No hepatosplenomegaly appreciated.  EXTREMITIES: Left ankle dressing over lateral malleolus severe deformity involving her hands and legs NEUROLOGIC: The patient is alert, awake, and oriented x3 with no focal motor or sensory deficits appreciated bilaterally.  SKIN: Moist and warm with no rashes appreciated.  Psych: Not anxious, depressed LN: No inguinal LN enlargement    Antibiotics   Anti-infectives    Start     Dose/Rate Route Frequency Ordered Stop   10/29/15 2000  vancomycin (VANCOCIN) 500 mg in sodium chloride 0.9 % 100 mL IVPB     500 mg 100 mL/hr over 60 Minutes Intravenous Every 24 hours 10/29/15 0742     10/28/15 2000  vancomycin (VANCOCIN) IVPB 750 mg/150 ml premix  Status:  Discontinued     750 mg 150 mL/hr over 60 Minutes Intravenous Every 24 hours 10/28/15 1510 10/29/15 0742   10/28/15 1800  vancomycin (VANCOCIN) IVPB 1000 mg/200 mL premix  Status:  Discontinued     1,000 mg 200 mL/hr over 60 Minutes Intravenous Every 36 hours 10/27/15 2130 10/28/15 1510   10/28/15 0000  piperacillin-tazobactam (ZOSYN) IVPB 3.375 g     3.375 g 12.5 mL/hr over 240 Minutes Intravenous Every 8 hours 10/27/15 2130     10/27/15 1745  vancomycin (VANCOCIN) IVPB 750 mg/150 ml premix     750 mg 150 mL/hr over 60 Minutes Intravenous  Once  10/27/15 1735 10/27/15 1853   10/27/15 1745  piperacillin-tazobactam (ZOSYN) IVPB 3.375 g     3.375 g 100 mL/hr over 30 Minutes Intravenous  Once 10/27/15 1735 10/27/15 2051      Medications   Scheduled Meds: . ALPRAZolam  0.5 mg Oral Daily  . aspirin  81 mg Oral Daily  . atorvastatin  40 mg Oral QHS  . calcium carbonate  600 mg of elemental calcium Oral Q breakfast  . donepezil  10 mg Oral QHS  . enoxaparin (LOVENOX) injection  30 mg Subcutaneous QHS  . folic acid  1 mg Oral Daily  . lisinopril  10 mg Oral Daily  . multivitamin with minerals  1 tablet Oral Daily  . omega-3 acid ethyl esters  2 g Oral Daily  . piperacillin-tazobactam (ZOSYN)  IV  3.375 g Intravenous Q8H  . vancomycin  500 mg Intravenous Q24H   Continuous Infusions: . sodium chloride 75 mL/hr at 10/30/15 0405   PRN Meds:.acetaminophen **OR** acetaminophen, bisacodyl, HYDROcodone-acetaminophen, magnesium citrate, ondansetron **OR** ondansetron (ZOFRAN) IV, oxyCODONE-acetaminophen, senna-docusate, zolpidem   Data Review:   Micro Results Recent Results (from the past 240 hour(s))  CULTURE, BLOOD (ROUTINE X 2) w Reflex to ID Panel     Status: None (Preliminary result)   Collection Time: 10/27/15  4:46 PM  Result Value Ref Range Status   Specimen Description BLOOD RIGHT ANTECUBITAL  Final   Special Requests BOTTLES DRAWN AEROBIC AND ANAEROBIC  10CC  Final   Culture NO GROWTH 3 DAYS  Final   Report Status PENDING  Incomplete  CULTURE, BLOOD (ROUTINE X 2) w Reflex to ID Panel     Status: None (Preliminary result)   Collection Time: 10/27/15  4:47 PM  Result Value Ref Range Status   Specimen Description BLOOD RIGHT WRIST  Final   Special Requests BOTTLES DRAWN AEROBIC AND ANAEROBIC  10CC  Final   Culture NO GROWTH 3 DAYS  Final   Report Status PENDING  Incomplete  Aerobic/Anaerobic Culture (surgical/deep wound)     Status: None (Preliminary result)   Collection Time: 10/28/15 10:24 AM  Result Value Ref Range  Status   Specimen Description ANKLE  Final   Special Requests NONE  Final   Gram Stain   Final    RARE WBC PRESENT, PREDOMINANTLY PMN RARE GRAM POSITIVE COCCI IN PAIRS [facility]    Culture   Final    FEW ENTEROBACTER AEROGENES NO ANAEROBES ISOLATED; CULTURE IN PROGRESS FOR 5 DAYS    Report Status PENDING  Incomplete   Organism ID, Bacteria ENTEROBACTER AEROGENES  Final      Susceptibility   Enterobacter aerogenes - MIC*    CEFAZOLIN >=64 RESISTANT Resistant     CEFEPIME <=1 SENSITIVE Sensitive     CEFTAZIDIME <=1 SENSITIVE Sensitive     CEFTRIAXONE <=1 SENSITIVE Sensitive     CIPROFLOXACIN <=0.25 SENSITIVE Sensitive     GENTAMICIN <=1 SENSITIVE Sensitive     IMIPENEM 1 SENSITIVE Sensitive     TRIMETH/SULFA <=20 SENSITIVE Sensitive     PIP/TAZO <=4 SENSITIVE Sensitive     * FEW ENTEROBACTER AEROGENES    Radiology Reports Dg Ankle Complete Left  Result Date: 10/27/2015 CLINICAL DATA:  Nonhealing wound in the lateral left ankle for 1 year. EXAM: LEFT ANKLE COMPLETE - 3+ VIEW COMPARISON:  None. FINDINGS: Diffuse osteopenia. Vascular calcifications throughout the soft tissues. 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 There is soft tissue swelling with soft tissue defect in the lateral left ankle. There is underlying mild cortical erosion at the lower left lateral malleolus. There is a tiny amount of soft tissue gas deep to the soft tissue defect in the lateral left ankle. Small plantar left calcaneal spur. IMPRESSION: 1. Acute osteomyelitis in the lower left lateral malleolus with associated overlying soft tissue defect and subcutaneous emphysema. 2. 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 3. Diffuse osteopenia. Electronically Signed   By: Delbert PhenixJason A Poff M.D.   On: 10/27/2015 16:51   Ct Ankle Left Wo Contrast  Result Date: 10/29/2015 CLINICAL DATA:  Distal fibular osteomyelitis with overlying soft tissue swelling and  possible abscess. EXAM: CT OF THE LEFT ANKLE WITHOUT CONTRAST TECHNIQUE: Multidetector CT imaging of the left ankle was performed according to the standard protocol. Multiplanar CT image reconstructions were also generated. COMPARISON:  10/29/2015 MRI FINDINGS: Bones/Joint/Cartilage MRI confirms an erosion along the inferior fibular tip were there is extensive spurring, corresponding to the focus of edema and cortical destruction on the MRI. This cortical defect is shown on image 72/5 and on image 72/6. Solid hindfoot and midfoot and midfoot fusion noted. Severe arthropathy at the Lisfranc joint, with fusion of the bases of the fourth and fifth metatarsals and questionable partial fusion of the bases of the second and third metatarsals. Plantar calcaneal spur. On image 125/8 there is a chronic nonunited fracture of the distal fifth metatarsal. Ligaments Suboptimally assessed by CT. Muscles and Tendons Grossly unremarkable. Soft tissues Plantar edema along the heel, especially below the plantar spur. Vascular calcifications noted. IMPRESSION: 1. Erosion laterally along the inferior fibular tip corresponding to the defect on MRI, and suspicious for focal osteomyelitis. 2. 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 Advanced arthropathy at the Lisfranc joint. 3. Chronic nonunited fracture of the  distal fifth metatarsal. 4. Plantar calcaneal spur, with underlying edema in the subcutaneous tissues potentially from plantar fasciitis. 5. Atherosclerotic vascular calcifications. Electronically Signed   By: Gaylyn Rong M.D.   On: 10/29/2015 19:52   Mr Ankle Left  Wo Contrast  Result Date: 10/29/2015 CLINICAL DATA:  Chronic left ankle ulcer.  Persistent drainage. EXAM: MRI OF THE LEFT ANKLE WITHOUT CONTRAST TECHNIQUE: Multiplanar, multisequence MR imaging of the ankle was performed. No intravenous contrast was administered. COMPARISON:  Radiographs  10/27/2015 FINDINGS: TENDONS Peroneal: Intact. Posteromedial: Intact. Anterior: Intact. Achilles: Intact. Plantar Fascia: Intact. LIGAMENTS Lateral: Intact. Medial: Intact. CARTILAGE Ankle Joint: The tibiotalar joint is fused. Subtalar Joints/Sinus Tarsi: The subtalar joints are fused and the midfoot joints are fused. The tarsal metatarsal joints are not fused but are severely degenerated. Bones: Edema like signal abnormality in the lateral malleolus consistent with osteomyelitis. There is overlying soft tissue swelling and probable granulation tissue but no discrete drainable abscess. No other areas of osteomyelitis are identified. Other: Mild diffuse fatty atrophy of the foot musculature. No findings for myofasciitis. IMPRESSION: 1. Osteomyelitis involving the distal fibula. Overlying soft tissue swelling and probable granulation tissue but no discrete drainable soft tissue abscess. 2. No findings for septic arthritis. 3. Tibiotalar, subtalar and midfoot fusions. Electronically Signed   By: Rudie Meyer M.D.   On: 10/29/2015 10:14   Dg Chest Port 1 View  Result Date: 10/29/2015 CLINICAL DATA:  PICC line placement EXAM: PORTABLE CHEST 1 VIEW COMPARISON:  06/08/2015 FINDINGS: Borderline cardiomegaly. There is small left pleural effusion with left basilar atelectasis or infiltrate. Right arm PICC line with tip in SVC right atrium. No pneumothorax. No pulmonary edema. Degenerative changes bilateral shoulders. IMPRESSION: Right arm PICC line in place. No pneumothorax. Small left pleural effusion with left basilar atelectasis or infiltrate. No pulmonary edema. Electronically Signed   By: Natasha Mead M.D.   On: 10/29/2015 17:04   Dg Foot 2 Views Right  Result Date: 10/27/2015 CLINICAL DATA:  Ulcer of the left ankle. Chronic ulceration between the third and fourth digits. History of rheumatoid arthritis. EXAM: RIGHT FOOT - 2 VIEW COMPARISON:  None. FINDINGS: Diffuse bone demineralization. Extension deformities of  the toes. Probable erosive changes at the metatarsal-phalangeal joints although visualization is limited because of positioning. These changes are likely related to the history of rheumatoid arthritis although focal osteomyelitis is not entirely excluded. Degenerative changes in the intertarsal joints. No radiopaque soft tissue foreign bodies. No soft tissue gas collections. Vascular calcifications. No acute fractures identified. IMPRESSION: Extension deformities of the toes with probable erosive changes at the metatarsal-phalangeal joints of all toes. Changes are likely due to remote arthritis but osteomyelitis is not entirely excluded. Diffuse bone station. Electronically Signed   By: Burman Nieves M.D.   On: 10/27/2015 22:45     CBC  Recent Labs Lab 10/27/15 1646 10/28/15 0412  WBC 7.4 6.5  HGB 10.8* 9.4*  HCT 31.6* 27.1*  PLT 288 229  MCV 87.0 86.6  MCH 29.6 29.9  MCHC 34.1 34.6  RDW 14.8* 14.5  LYMPHSABS 0.6*  --   MONOABS 0.5  --   EOSABS 0.1  --   BASOSABS 0.1  --     Chemistries   Recent Labs Lab 10/27/15 1646 10/28/15 0412 10/29/15 0411 10/30/15 0337  NA 132* 137 137  --   K 4.5 4.0 4.1  --   CL 101 109 111  --   CO2 24 22 23   --   GLUCOSE 122*  83 85  --   BUN 28* 26* 20  --   CREATININE 1.40* 1.26* 1.57* 1.34*  CALCIUM 9.2 9.1 9.0  --   AST 19  --   --   --   ALT 12*  --   --   --   ALKPHOS 93  --   --   --   BILITOT 0.2*  --   --   --    ------------------------------------------------------------------------------------------------------------------ estimated creatinine clearance is 30.1 mL/min (by C-G formula based on SCr of 1.34 mg/dL (H)). ------------------------------------------------------------------------------------------------------------------ No results for input(s): HGBA1C in the last 72 hours. ------------------------------------------------------------------------------------------------------------------ No results for input(s): CHOL,  HDL, LDLCALC, TRIG, CHOLHDL, LDLDIRECT in the last 72 hours. ------------------------------------------------------------------------------------------------------------------ No results for input(s): TSH, T4TOTAL, T3FREE, THYROIDAB in the last 72 hours.  Invalid input(s): FREET3 ------------------------------------------------------------------------------------------------------------------ No results for input(s): VITAMINB12, FOLATE, FERRITIN, TIBC, IRON, RETICCTPCT in the last 72 hours.  Coagulation profile No results for input(s): INR, PROTIME in the last 168 hours.  No results for input(s): DDIMER in the last 72 hours.  Cardiac Enzymes No results for input(s): CKMB, TROPONINI, MYOGLOBIN in the last 168 hours.  Invalid input(s): CK ------------------------------------------------------------------------------------------------------------------ Invalid input(s): POCBNP    Assessment & Plan    This is a 60 y.o. female with a history of rheumatoid arthritis, psoriatic arthritis, vascular dementia, alcohol abuse now being admitted with: 1. Acute osteomyelitis of the left lateral ankle  Appreciate podiatry input MRI confirms osteomyelitis PICC line placed yesterday I will ask ID to see Will need intraoperative cultures superficial cultures are showing Enterobacter aerogenes 2. AK I-continue to monitor currently stable 3. Anemia, chronic, monitor CBC  4. Hyponatremia, mild-improved with IV hydration 5. History of TIA/CVA-continue aspirin 6. History of vascular dementia-continue Aricept, folic acid 7. History of hypertension-patient was on metoprolol due to heart rate stopped started on lisinopril which she does not want to take due to her reading the pamphlet for that medication showing a lot of adverse effects       Code Status Orders        Start     Ordered   10/27/15 2114  Full code  Continuous     10/27/15 2113    Code Status History    Date Active Date  Inactive Code Status Order ID Comments User Context   06/08/2015  2:45 PM 06/09/2015  4:36 PM Full Code [id number]  Shaune Pollack, MD ED           Consults  Podiatry   DVT Prophylaxis  Lovenox  Lab Results  Component Value Date   PLT 229 10/28/2015     Time Spent in minutes   32 minutes Greater than 50% of time spent in care coordination and counseling patient regarding the condition and plan of care.   Auburn Bilberry M.D on 10/30/2015 at 2:01 PM  Between 7am to 6pm - Pager - [phone]  After 6pm go to [email or web address] - password [email or web address] [facility]  [facility] [place] [facility]   Office  [phone]

## 2015-10-30 NOTE — Progress Notes (Signed)
Daily Progress Note   Subjective  - * No surgery found *  F/u left ankle ulceration.  PICC placed.    Objective Vitals:   10/29/15 1027 10/29/15 1300 10/29/15 1959 10/30/15 0616  BP: (!) 158/71 (!) 171/74 (!) 137/53 (!) 147/80  Pulse: 61 72 60 70  Resp: 18 18 18 18   Temp: 98 F (36.7 C) 97.5 F (36.4 C) 98 F (36.7 C) 98 F (36.7 C)  TempSrc: Oral Oral Oral Oral  SpO2: 97%  99% 98%  Weight:      Height:        Physical Exam: MRI confirms osteo to distal fibula CT confirms fusion of ankle and subtalar joint. Palpable pulses to DP and PT. Laboratory CBC    Component Value Date/Time   WBC 6.5 10/28/2015 0412   HGB 9.4 (L) 10/28/2015 0412   HCT 27.1 (L) 10/28/2015 0412   PLT 229 10/28/2015 0412    BMET    Component Value Date/Time   NA 137 10/29/2015 0411   K 4.1 10/29/2015 0411   CL 111 10/29/2015 0411   CO2 23 10/29/2015 0411   GLUCOSE 85 10/29/2015 0411   BUN 20 10/29/2015 0411   CREATININE 1.34 (H) 10/30/2015 0337   CALCIUM 9.0 10/29/2015 0411   GFRNONAA 41 (L) 10/30/2015 0337   GFRAA 48 (L) 10/30/2015 11/01/2015    Assessment/Planning: Osteomyelitis left distal fibula with ankle and STJ fusion   Will plan for debridment of distal fibula left ankle tomorrow and wound vac placement.  Will likely be in afternoon so will allow liquid breakfast till 7am then NPO afterwards.    4503 A  10/30/2015, 7:58 AM

## 2015-10-31 ENCOUNTER — Encounter
Admission: EM | Disposition: A | Discharge status: Skilled Nursing Facility | Payer: Self-pay | Source: Home / Self Care | Attending: Internal Medicine

## 2015-10-31 ENCOUNTER — Inpatient Hospital Stay: Payer: Commercial Managed Care - HMO | Admitting: Anesthesiology

## 2015-10-31 HISTORY — PX: APPLICATION OF WOUND VAC: SHX5189

## 2015-10-31 HISTORY — PX: IRRIGATION AND DEBRIDEMENT FOOT: SHX6602

## 2015-10-31 LAB — BASIC METABOLIC PANEL
Anion gap: 6 (ref 5–15)
BUN: 13 mg/dL (ref 6–20)
CALCIUM: 8.9 mg/dL (ref 8.9–10.3)
CHLORIDE: 109 mmol/L (ref 101–111)
CO2: 25 mmol/L (ref 22–32)
Creatinine, Ser: 1.25 mg/dL — ABNORMAL HIGH (ref 0.44–1.00)
GFR, EST AFRICAN AMERICAN: 52 mL/min — AB (ref >60)
GFR, EST NON AFRICAN AMERICAN: 45 mL/min — AB (ref >60)
GLUCOSE: 93 mg/dL (ref 65–99)
Potassium: 3.4 mmol/L — ABNORMAL LOW (ref 3.5–5.1)
SODIUM: 140 mmol/L (ref 135–145)

## 2015-10-31 LAB — CBC
HCT: 28.6 % — ABNORMAL LOW (ref 35.0–47.0)
HEMOGLOBIN: 9.4 g/dL — AB (ref 12.0–16.0)
MCH: 28.7 pg (ref 26.0–34.0)
MCHC: 32.9 g/dL (ref 32.0–36.0)
MCV: 87.2 fL (ref 80.0–100.0)
Platelets: 215 10*3/uL (ref 150–440)
RBC: 3.28 MIL/uL — ABNORMAL LOW (ref 3.80–5.20)
RDW: 14.6 % — ABNORMAL HIGH (ref 11.5–14.5)
WBC: 6 10*3/uL (ref 3.6–11.0)

## 2015-10-31 LAB — SEDIMENTATION RATE: Sed Rate: 51 mm/hr — ABNORMAL HIGH (ref 0–30)

## 2015-10-31 LAB — C-REACTIVE PROTEIN: CRP: 5.1 mg/dL — ABNORMAL HIGH (ref <1.0)

## 2015-10-31 LAB — MRSA PCR SCREENING: MRSA by PCR: POSITIVE — AB

## 2015-10-31 SURGERY — IRRIGATION AND DEBRIDEMENT FOOT
Anesthesia: General | Site: Ankle | Laterality: Left | Wound class: Dirty or Infected

## 2015-10-31 MED ORDER — LACTATED RINGERS IV SOLN
INTRAVENOUS | Status: DC | PRN
  Administered 2015-10-31: 17:00:00 via INTRAVENOUS

## 2015-10-31 MED ORDER — CHLORHEXIDINE GLUCONATE CLOTH 2 % EX PADS
6.0000 | MEDICATED_PAD | Freq: Every day | CUTANEOUS | Status: DC
  Administered 2015-10-31 – 2015-11-02 (×3): 6 via TOPICAL

## 2015-10-31 MED ORDER — OXYCODONE-ACETAMINOPHEN 5-325 MG PO TABS
1.0000 | ORAL_TABLET | ORAL | Status: DC | PRN
  Administered 2015-11-01 (×2): 1 via ORAL
  Administered 2015-11-01: 2 via ORAL
  Administered 2015-11-01: 1 via ORAL
  Administered 2015-11-01: 2 via ORAL
  Administered 2015-11-01: 1 via ORAL
  Filled 2015-10-31: qty 1
  Filled 2015-10-31: qty 2
  Filled 2015-10-31 (×2): qty 1
  Filled 2015-10-31: qty 2
  Filled 2015-10-31: qty 1

## 2015-10-31 MED ORDER — FENTANYL CITRATE (PF) 100 MCG/2ML IJ SOLN
INTRAMUSCULAR | Status: DC | PRN
  Administered 2015-10-31: 50 ug via INTRAVENOUS
  Administered 2015-10-31 (×2): 25 ug via INTRAVENOUS

## 2015-10-31 MED ORDER — POVIDONE-IODINE 10 % EX SWAB: 2.0000 "application " | Freq: Once | CUTANEOUS | Status: DC

## 2015-10-31 MED ORDER — ONDANSETRON HCL 4 MG/2ML IJ SOLN: 4.0000 mg | Freq: Once | INTRAMUSCULAR | Status: DC | PRN

## 2015-10-31 MED ORDER — CEFAZOLIN SODIUM-DEXTROSE 2-4 GM/100ML-% IV SOLN
2.0000 g | INTRAVENOUS | Status: AC
  Administered 2015-10-31: 2 g via INTRAVENOUS
  Filled 2015-10-31: qty 100

## 2015-10-31 MED ORDER — LIDOCAINE HCL 1 % IJ SOLN
INTRAMUSCULAR | Status: DC | PRN
  Administered 2015-10-31: 10 mL

## 2015-10-31 MED ORDER — DEXAMETHASONE SODIUM PHOSPHATE 10 MG/ML IJ SOLN
INTRAMUSCULAR | Status: DC | PRN
  Administered 2015-10-31: 5 mg via INTRAVENOUS

## 2015-10-31 MED ORDER — MUPIROCIN 2 % EX OINT
1.0000 "application " | TOPICAL_OINTMENT | Freq: Two times a day (BID) | CUTANEOUS | Status: DC
  Administered 2015-10-31 – 2015-11-02 (×5): 1 via NASAL
  Filled 2015-10-31 (×2): qty 22

## 2015-10-31 MED ORDER — PROPOFOL 10 MG/ML IV BOLUS
INTRAVENOUS | Status: DC | PRN
  Administered 2015-10-31: 80 mg via INTRAVENOUS

## 2015-10-31 MED ORDER — BUPIVACAINE HCL 0.5 % IJ SOLN
INTRAMUSCULAR | Status: DC | PRN
  Administered 2015-10-31: 10 mL
  Administered 2015-10-31: 20 mL

## 2015-10-31 MED ORDER — FENTANYL CITRATE (PF) 100 MCG/2ML IJ SOLN: 25.0000 ug | INTRAMUSCULAR | Status: DC | PRN

## 2015-10-31 MED ORDER — CHLORHEXIDINE GLUCONATE 4 % EX LIQD
60.0000 mL | Freq: Once | CUTANEOUS | Status: AC
  Administered 2015-10-31: 4 via TOPICAL

## 2015-10-31 MED ORDER — MORPHINE SULFATE (PF) 2 MG/ML IV SOLN
1.0000 mg | INTRAVENOUS | Status: DC | PRN
  Administered 2015-11-01 (×2): 1 mg via INTRAVENOUS
  Filled 2015-10-31 (×2): qty 1

## 2015-10-31 MED ORDER — METOPROLOL TARTRATE 25 MG PO TABS
25.0000 mg | ORAL_TABLET | Freq: Two times a day (BID) | ORAL | Status: DC
  Administered 2015-10-31 – 2015-11-02 (×5): 25 mg via ORAL
  Filled 2015-10-31 (×5): qty 1

## 2015-10-31 SURGICAL SUPPLY — 67 items
BANDAGE ACE 4X5 VEL STRL LF (GAUZE/BANDAGES/DRESSINGS) ×2 IMPLANT
BANDAGE STRETCH 3X4.1 STRL (GAUZE/BANDAGES/DRESSINGS) ×2 IMPLANT
BLADE OSC/SAGITTAL MD 5.5X18 (BLADE) IMPLANT
BLADE OSCILLATING/SAGITTAL (BLADE)
BLADE SURG 15 STRL LF DISP TIS (BLADE) ×1 IMPLANT
BLADE SURG 15 STRL SS (BLADE) ×1
BLADE SW THK.38XMED LNG THN (BLADE) IMPLANT
BNDG COHESIVE 4X5 TAN STRL (GAUZE/BANDAGES/DRESSINGS) ×2 IMPLANT
BNDG COHESIVE 6X5 TAN STRL LF (GAUZE/BANDAGES/DRESSINGS) ×2 IMPLANT
BNDG ESMARK 4X12 TAN STRL LF (GAUZE/BANDAGES/DRESSINGS) ×2 IMPLANT
BNDG GAUZE 4.5X4.1 6PLY STRL (MISCELLANEOUS) ×2 IMPLANT
CANISTER SUCT 1200ML W/VALVE (MISCELLANEOUS) ×2 IMPLANT
CANISTER SUCT 3000ML (MISCELLANEOUS) ×2 IMPLANT
CUFF TOURN 18 STER (MISCELLANEOUS) ×2 IMPLANT
CUFF TOURN DUAL PL 12 NO SLV (MISCELLANEOUS) IMPLANT
DRAPE FLUOR MINI C-ARM 54X84 (DRAPES) IMPLANT
DRAPE XRAY CASSETTE 23X24 (DRAPES) IMPLANT
DRESSING ALLEVYN 4X4 (MISCELLANEOUS) IMPLANT
DRSG MEPITEL 4X7.2 (GAUZE/BANDAGES/DRESSINGS) ×2 IMPLANT
DURAPREP 26ML APPLICATOR (WOUND CARE) ×2 IMPLANT
ELECT REM PT RETURN 9FT ADLT (ELECTROSURGICAL) ×2
ELECTRODE REM PT RTRN 9FT ADLT (ELECTROSURGICAL) ×1 IMPLANT
GAUZE PACKING 1/4X5YD (GAUZE/BANDAGES/DRESSINGS) ×2 IMPLANT
GAUZE PACKING IODOFORM 1X5 (MISCELLANEOUS) ×2 IMPLANT
GAUZE PETRO XEROFOAM 1X8 (MISCELLANEOUS) ×2 IMPLANT
GAUZE SPONGE 4X4 12PLY STRL (GAUZE/BANDAGES/DRESSINGS) ×2 IMPLANT
GAUZE STRETCH 2X75IN STRL (MISCELLANEOUS) ×2 IMPLANT
GLOVE BIO SURGEON STRL SZ7.5 (GLOVE) ×2 IMPLANT
GLOVE INDICATOR 8.0 STRL GRN (GLOVE) ×2 IMPLANT
GOWN STRL REUS W/ TWL LRG LVL3 (GOWN DISPOSABLE) ×2 IMPLANT
GOWN STRL REUS W/TWL LRG LVL3 (GOWN DISPOSABLE) ×2
GOWN STRL REUS W/TWL MED LVL3 (GOWN DISPOSABLE) ×4 IMPLANT
HANDPIECE INTERPULSE COAX TIP (DISPOSABLE) ×1
HANDPIECE VERSAJET DEBRIDEMENT (MISCELLANEOUS) IMPLANT
IV NS 1000ML (IV SOLUTION) ×1
IV NS 1000ML BAXH (IV SOLUTION) ×1 IMPLANT
KIT DRSG VAC SLVR GRANUFM (MISCELLANEOUS) ×4 IMPLANT
KIT RM TURNOVER STRD PROC AR (KITS) ×2 IMPLANT
KIT STIMULAN RAPID CURE 5CC (Orthopedic Implant) ×2 IMPLANT
LABEL OR SOLS (LABEL) ×2 IMPLANT
NEEDLE FILTER BLUNT 18X 1/2SAF (NEEDLE) ×1
NEEDLE FILTER BLUNT 18X1 1/2 (NEEDLE) ×1 IMPLANT
NEEDLE HYPO 25X1 1.5 SAFETY (NEEDLE) ×2 IMPLANT
NS IRRIG 500ML POUR BTL (IV SOLUTION) ×2 IMPLANT
PACK EXTREMITY ARMC (MISCELLANEOUS) ×2 IMPLANT
PAD ABD DERMACEA PRESS 5X9 (GAUZE/BANDAGES/DRESSINGS) ×2 IMPLANT
PENCIL ELECTRO HAND CTR (MISCELLANEOUS) ×2 IMPLANT
RASP SM TEAR CROSS CUT (RASP) IMPLANT
SET HNDPC FAN SPRY TIP SCT (DISPOSABLE) ×1 IMPLANT
SOL .9 NS 3000ML IRR  AL (IV SOLUTION) ×1
SOL .9 NS 3000ML IRR UROMATIC (IV SOLUTION) ×1 IMPLANT
SOL PREP PVP 2OZ (MISCELLANEOUS) ×2
SOLUTION PREP PVP 2OZ (MISCELLANEOUS) ×1 IMPLANT
SPONGE LAP 18X18 5 PK (GAUZE/BANDAGES/DRESSINGS) ×2 IMPLANT
STOCKINETTE IMPERVIOUS 9X36 MD (GAUZE/BANDAGES/DRESSINGS) ×2 IMPLANT
SUT ETHILON 2 0 FS 18 (SUTURE) ×4 IMPLANT
SUT ETHILON 4-0 (SUTURE) ×1
SUT ETHILON 4-0 FS2 18XMFL BLK (SUTURE) ×1
SUT VIC AB 3-0 SH 27 (SUTURE) ×1
SUT VIC AB 3-0 SH 27X BRD (SUTURE) ×1 IMPLANT
SUT VIC AB 4-0 FS2 27 (SUTURE) ×2 IMPLANT
SUTURE ETHLN 4-0 FS2 18XMF BLK (SUTURE) ×1 IMPLANT
SWAB CULTURE AMIES ANAERIB BLU (MISCELLANEOUS) IMPLANT
SYR 3ML LL SCALE MARK (SYRINGE) ×2 IMPLANT
SYRINGE 10CC LL (SYRINGE) ×4 IMPLANT
TRAY PREP VAG/GEN (MISCELLANEOUS) ×2 IMPLANT
WND VAC CANISTER 500ML (MISCELLANEOUS) ×2 IMPLANT

## 2015-10-31 NOTE — Progress Notes (Signed)
CH made a follow up visit with Pt before surgery. Pt was noticeably calmer about her upcoming surgery. Pt thanked me for my words of encouragement and looks forward to my post op follow up. Provided the ministry of prayer.    10/31/15 1300  Clinical Encounter Type  Visited With Patient;Health care provider  Visit Type Follow-up;Pre-op  Referral From Nurse  Spiritual Encounters  Spiritual Needs Prayer;Emotional  Stress Factors  Patient Stress Factors Health changes

## 2015-10-31 NOTE — Progress Notes (Signed)
Pt sent to OR. Otilio Jefferson, RN

## 2015-10-31 NOTE — Transfer of Care (Signed)
Immediate Anesthesia Transfer of Care Note  Patient: Ruth Price  Procedure(s) Performed: Procedure(s): IRRIGATION AND DEBRIDEMENT FOOT (Left) APPLICATION OF WOUND VAC (Left)  Patient Location: PACU  Anesthesia Type:General  Level of Consciousness: awake, alert  and oriented  Airway & Oxygen Therapy: Patient connected to face mask oxygen  Post-op Assessment: Post -op Vital signs reviewed and stable  Post vital signs: stable  Last Vitals:  Vitals:   10/31/15 1242 10/31/15 1809  BP: (!) 172/69 (!) 162/88  Pulse: (!) 55 64  Resp: 18 12  Temp: 36.6 C 36.3 C    Last Pain:  Vitals:   10/31/15 1242  TempSrc: Oral  PainSc:          Complications: No apparent anesthesia complications

## 2015-10-31 NOTE — Anesthesia Procedure Notes (Signed)
Procedure Name: LMA Insertion Date/Time: 10/31/2015 5:26 PM Performed by: Junious Silk Pre-anesthesia Checklist: Patient identified, Patient being monitored, Timeout performed, Emergency Drugs available and Suction available Patient Re-evaluated:Patient Re-evaluated prior to inductionOxygen Delivery Method: Circle system utilized Preoxygenation: Pre-oxygenation with 100% oxygen Intubation Type: IV induction Ventilation: Mask ventilation without difficulty LMA: LMA inserted LMA Size: 3.0 Grade View: Grade II Tube type: Oral Number of attempts: 1 Placement Confirmation: positive ETCO2 and breath sounds checked- equal and bilateral Tube secured with: Tape Dental Injury: Teeth and Oropharynx as per pre-operative assessment

## 2015-10-31 NOTE — Anesthesia Preprocedure Evaluation (Signed)
Anesthesia Evaluation  Patient identified by MRN, date of birth, ID band Patient awake    Reviewed: Allergy & Precautions, H&P , NPO status , Patient's Chart, lab work & pertinent test results, reviewed documented beta blocker date and time   Airway Mallampati: II  TM Distance: >3 FB Neck ROM: full    Dental  (+) Teeth Intact   Pulmonary neg pulmonary ROS,    Pulmonary exam normal        Cardiovascular Exercise Tolerance: Good hypertension, negative cardio ROS Normal cardiovascular exam Rate:Normal     Neuro/Psych PSYCHIATRIC DISORDERS negative neurological ROS  negative psych ROS   GI/Hepatic negative GI ROS, Neg liver ROS,   Endo/Other  negative endocrine ROS  Renal/GU negative Renal ROS  negative genitourinary   Musculoskeletal   Abdominal   Peds  Hematology negative hematology ROS (+)   Anesthesia Other Findings   Reproductive/Obstetrics negative OB ROS                             Anesthesia Physical Anesthesia Plan  ASA: III  Anesthesia Plan: General LMA   Post-op Pain Management:    Induction:   Airway Management Planned:   Additional Equipment:   Intra-op Plan:   Post-operative Plan:   Informed Consent: I have reviewed the patients History and Physical, chart, labs and discussed the procedure including the risks, benefits and alternatives for the proposed anesthesia with the patient or authorized representative who has indicated his/her understanding and acceptance.     Plan Discussed with: CRNA  Anesthesia Plan Comments:         Anesthesia Quick Evaluation

## 2015-10-31 NOTE — H&P (Signed)
HISTORY AND PHYSICAL INTERVAL NOTE:  10/31/2015  4:52 PM  Ruth Price  has presented today for surgery, with the diagnosis of ankle osteomyelitis.  The various methods of treatment have been discussed with the patient.  No guarantees were given.  After consideration of risks, benefits and other options for treatment, the patient has consented to surgery.  I have reviewed the patients' chart and labs.    Patient Vitals for the past 24 hrs:  BP Temp Temp src Pulse Resp SpO2  10/31/15 1242 (!) 172/69 97.9 F (36.6 C) Oral (!) 55 18 91 %  10/31/15 1146 (!) 178/72 - - 68 - -  10/31/15 1105 (!) 158/53 - - 72 - -  10/31/15 0556 (!) 174/81 97.9 F (36.6 C) Oral 73 (!) 24 99 %  10/30/15 2132 (!) 152/78 98.1 F (36.7 C) Oral 67 18 99 %    A history and physical examination was performed on the floor.  The patient was reexamined.  There have been no changes to this history and physical examination.  Gwyneth Revels A

## 2015-10-31 NOTE — Progress Notes (Signed)
[facility] [place] [facility]                                                                                                                                                                                            Patient Demographics   Ruth Price, is a 60 y.o. female, DOB - October 13, 1955, RFF:[id number]  Admit date - 10/27/2015   Admitting Physician Tonye Royalty, DO  Outpatient Primary MD for the patient is ALCALA, BETH C, NP   LOS - 4  Subjective: Patient is waiting to go to the OR later today denies any significant complaints  Review of Systems:   CONSTITUTIONAL: No documented fever. No fatigue, weakness. No weight gain, no weight loss.  EYES: No blurry or double vision.  ENT: No tinnitus. No postnasal drip. No redness of the oropharynx.  RESPIRATORY: No cough, no wheeze, no hemoptysis. No dyspnea.  CARDIOVASCULAR: No chest pain. No orthopnea. No palpitations. No syncope.  GASTROINTESTINAL: No nausea, no vomiting or diarrhea. No abdominal pain. No melena or hematochezia.  GENITOURINARY: No dysuria or hematuria.  ENDOCRINE: No polyuria or nocturia. No heat or cold intolerance.  HEMATOLOGY: No anemia. No bruising. No bleeding.  INTEGUMENTARY: No rashes. No lesions.  MUSCULOSKELETAL: Positive left ankle pain NEUROLOGIC: No numbness, tingling, or ataxia. No seizure-type activity.  PSYCHIATRIC: No anxiety. No insomnia. No ADD.    Vitals:   Vitals:   10/30/15 1204 10/30/15 2132 10/31/15 0556 10/31/15 1105  BP: (!) 161/60 (!) 152/78 (!) 174/81 (!) 158/53  Pulse: 67 67 73 72  Resp: 16 18 (!) 24   Temp: 97.5 F (36.4 C) 98.1 F (36.7 C) 97.9 F (36.6 C)   TempSrc: Oral Oral Oral   SpO2: 100% 99% 99%   Weight:      Height:        Wt Readings from Last 3 Encounters:  10/27/15 96 lb 9.6 oz (43.8 kg)  06/08/15 101 lb 11.2 oz (46.1 kg)     Intake/Output Summary (Last 24 hours) at 10/31/15 1109 Last data filed at 10/30/15 1800  Gross  per 24 hour  Intake           1032.5 ml  Output                0 ml  Net           1032.5 ml    Physical Exam:   GENERAL: Pleasant-appearing in no apparent distress.  HEAD, EYES, EARS, NOSE AND THROAT: Atraumatic, normocephalic. Extraocular muscles are intact. Pupils equal and reactive to light. Sclerae anicteric. No conjunctival injection. No oro-pharyngeal erythema.  NECK: Supple. There is no jugular venous distention. No bruits, no lymphadenopathy, no thyromegaly.  HEART: Regular rate and rhythm,. No murmurs, no rubs, no clicks.  LUNGS: Clear to auscultation bilaterally. No rales or rhonchi. No wheezes.  ABDOMEN: Soft, flat, nontender, nondistended. Has good bowel sounds. No hepatosplenomegaly appreciated.  EXTREMITIES: Left ankle dressing over lateral malleolus severe deformity involving her hands and legs NEUROLOGIC: The patient is alert, awake, and oriented x3 with no focal motor or sensory deficits appreciated bilaterally.  SKIN: Moist and warm with no rashes appreciated.  Psych: Not anxious, depressed LN: No inguinal LN enlargement    Antibiotics   Anti-infectives    Start     Dose/Rate Route Frequency Ordered Stop   10/31/15 0043  ceFAZolin (ANCEF) IVPB 2g/100 mL premix     2 g 200 mL/hr over 30 Minutes Intravenous On call to O.R. 10/31/15 0043 11/01/15 0559   10/29/15 2000  vancomycin (VANCOCIN) 500 mg in sodium chloride 0.9 % 100 mL IVPB     500 mg 100 mL/hr over 60 Minutes Intravenous Every 24 hours 10/29/15 0742     10/28/15 2000  vancomycin (VANCOCIN) IVPB 750 mg/150 ml premix  Status:  Discontinued     750 mg 150 mL/hr over 60 Minutes Intravenous Every 24 hours 10/28/15 1510 10/29/15 0742   10/28/15 1800  vancomycin (VANCOCIN) IVPB 1000 mg/200 mL premix  Status:  Discontinued     1,000 mg 200 mL/hr over 60 Minutes Intravenous Every 36 hours 10/27/15 2130 10/28/15 1510   10/28/15 0000  piperacillin-tazobactam (ZOSYN) IVPB 3.375 g     3.375 g 12.5 mL/hr over 240  Minutes Intravenous Every 8 hours 10/27/15 2130     10/27/15 1745  vancomycin (VANCOCIN) IVPB 750 mg/150 ml premix     750 mg 150 mL/hr over 60 Minutes Intravenous  Once 10/27/15 1735 10/27/15 1853   10/27/15 1745  piperacillin-tazobactam (ZOSYN) IVPB 3.375 g     3.375 g 100 mL/hr over 30 Minutes Intravenous  Once 10/27/15 1735 10/27/15 2051      Medications   Scheduled Meds: . ALPRAZolam  0.5 mg Oral Daily  . aspirin  81 mg Oral Daily  . atorvastatin  40 mg Oral QHS  . calcium carbonate  600 mg of elemental calcium Oral Q breakfast  .  ceFAZolin (ANCEF) IV  2 g Intravenous On Call to OR  . Chlorhexidine Gluconate Cloth  6 each Topical Q0600  . donepezil  10 mg Oral QHS  . enoxaparin (LOVENOX) injection  30 mg Subcutaneous QHS  . folic acid  1 mg Oral Daily  . lisinopril  10 mg Oral Daily  . multivitamin with minerals  1 tablet Oral Daily  . mupirocin ointment  1 application Nasal BID  . omega-3 acid ethyl esters  2 g Oral Daily  . piperacillin-tazobactam (ZOSYN)  IV  3.375 g Intravenous Q8H  . vancomycin  500 mg Intravenous Q24H   Continuous Infusions: . sodium chloride 75 mL/hr at 10/31/15 0808   PRN Meds:.acetaminophen **OR** acetaminophen, bisacodyl, HYDROcodone-acetaminophen, magnesium citrate, ondansetron **OR** ondansetron (ZOFRAN) IV, oxyCODONE-acetaminophen, senna-docusate, sodium chloride, zolpidem   Data Review:   Micro Results Recent Results (from the past 240 hour(s))  CULTURE, BLOOD (ROUTINE X 2) w Reflex to ID Panel     Status: None (Preliminary result)   Collection Time: 10/27/15  4:46 PM  Result Value Ref Range Status   Specimen Description BLOOD RIGHT ANTECUBITAL  Final   Special Requests BOTTLES DRAWN AEROBIC AND ANAEROBIC  10CC  Final   Culture NO GROWTH 4 DAYS  Final   Report Status PENDING  Incomplete  CULTURE, BLOOD (ROUTINE X 2) w Reflex to ID Panel     Status: None (Preliminary result)   Collection Time: 10/27/15  4:47 PM  Result Value Ref  Range Status   Specimen Description BLOOD RIGHT WRIST  Final   Special Requests BOTTLES DRAWN AEROBIC AND ANAEROBIC  10CC  Final   Culture NO GROWTH 4 DAYS  Final   Report Status PENDING  Incomplete  Aerobic/Anaerobic Culture (surgical/deep wound)     Status: None (Preliminary result)   Collection Time: 10/28/15 10:24 AM  Result Value Ref Range Status   Specimen Description ANKLE  Final   Special Requests NONE  Final   Gram Stain   Final    RARE WBC PRESENT, PREDOMINANTLY PMN RARE GRAM POSITIVE COCCI IN PAIRS Performed at [facility]    Culture   Final    FEW ENTEROBACTER AEROGENES NO ANAEROBES ISOLATED; CULTURE IN PROGRESS FOR 5 DAYS    Report Status PENDING  Incomplete   Organism ID, Bacteria ENTEROBACTER AEROGENES  Final      Susceptibility   Enterobacter aerogenes - MIC*    CEFAZOLIN >=64 RESISTANT Resistant     CEFEPIME <=1 SENSITIVE Sensitive     CEFTAZIDIME <=1 SENSITIVE Sensitive     CEFTRIAXONE <=1 SENSITIVE Sensitive     CIPROFLOXACIN <=0.25 SENSITIVE Sensitive     GENTAMICIN <=1 SENSITIVE Sensitive     IMIPENEM 1 SENSITIVE Sensitive     TRIMETH/SULFA <=20 SENSITIVE Sensitive     PIP/TAZO <=4 SENSITIVE Sensitive     * FEW ENTEROBACTER AEROGENES  MRSA PCR Screening     Status: Abnormal   Collection Time: 10/31/15  4:22 AM  Result Value Ref Range Status   MRSA by PCR POSITIVE (A) NEGATIVE Final    Comment:        The GeneXpert MRSA Assay (FDA approved for NASAL specimens only), is one component of a comprehensive MRSA colonization surveillance program. It is not intended to diagnose MRSA infection nor to guide or monitor treatment for MRSA infections. CRITICAL RESULT CALLED TO, READ BACK BY AND VERIFIED WITH: DRUSELLA JACKSON@0700  ON 10/31/15 BY HKP     Radiology Reports Dg Ankle Complete Left  Result Date: 10/27/2015 CLINICAL DATA:  Nonhealing wound in the lateral left ankle for 1 year. EXAM: LEFT ANKLE COMPLETE - 3+ VIEW COMPARISON:  None.  FINDINGS: Diffuse osteopenia. Vascular calcifications throughout the soft tissues. 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 There is soft tissue swelling with soft tissue defect in the lateral left ankle. There is underlying mild cortical erosion at the lower left lateral malleolus. There is a tiny amount of soft tissue gas deep to the soft tissue defect in the lateral left ankle. Small plantar left calcaneal spur. IMPRESSION: 1. Acute osteomyelitis in the lower left lateral malleolus with associated overlying soft tissue defect and subcutaneous emphysema. 2. 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 3. Diffuse osteopenia. Electronically Signed   By: Delbert Phenix M.D.   On: 10/27/2015 16:51   Ct Ankle Left Wo Contrast  Result Date: 10/29/2015 CLINICAL DATA:  Distal fibular osteomyelitis with overlying soft tissue swelling and possible abscess. EXAM: CT OF THE LEFT ANKLE WITHOUT CONTRAST TECHNIQUE: Multidetector CT imaging of the left ankle was performed according to the standard protocol. Multiplanar CT image reconstructions were also generated. COMPARISON:  10/29/2015 MRI  FINDINGS: Bones/Joint/Cartilage MRI confirms an erosion along the inferior fibular tip were there is extensive spurring, corresponding to the focus of edema and cortical destruction on the MRI. This cortical defect is shown on image 72/5 and on image 72/6. Solid hindfoot and midfoot and midfoot fusion noted. Severe arthropathy at the Lisfranc joint, with fusion of the bases of the fourth and fifth metatarsals and questionable partial fusion of the bases of the second and third metatarsals. Plantar calcaneal spur. On image 125/8 there is a chronic nonunited fracture of the distal fifth metatarsal. Ligaments Suboptimally assessed by CT. Muscles and Tendons Grossly unremarkable. Soft tissues Plantar edema along the heel, especially below the plantar spur. Vascular calcifications noted. IMPRESSION:  1. Erosion laterally along the inferior fibular tip corresponding to the defect on MRI, and suspicious for focal osteomyelitis. 2. 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 Advanced arthropathy at the Lisfranc joint. 3. Chronic nonunited fracture of the distal fifth metatarsal. 4. Plantar calcaneal spur, with underlying edema in the subcutaneous tissues potentially from plantar fasciitis. 5. Atherosclerotic vascular calcifications. Electronically Signed   By: Gaylyn Rong M.D.   On: 10/29/2015 19:52   Mr Ankle Left  Wo Contrast  Result Date: 10/29/2015 CLINICAL DATA:  Chronic left ankle ulcer.  Persistent drainage. EXAM: MRI OF THE LEFT ANKLE WITHOUT CONTRAST TECHNIQUE: Multiplanar, multisequence MR imaging of the ankle was performed. No intravenous contrast was administered. COMPARISON:  Radiographs 10/27/2015 FINDINGS: TENDONS Peroneal: Intact. Posteromedial: Intact. Anterior: Intact. Achilles: Intact. Plantar Fascia: Intact. LIGAMENTS Lateral: Intact. Medial: Intact. CARTILAGE Ankle Joint: The tibiotalar joint is fused. Subtalar Joints/Sinus Tarsi: The subtalar joints are fused and the midfoot joints are fused. The tarsal metatarsal joints are not fused but are severely degenerated. Bones: Edema like signal abnormality in the lateral malleolus consistent with osteomyelitis. There is overlying soft tissue swelling and probable granulation tissue but no discrete drainable abscess. No other areas of osteomyelitis are identified. Other: Mild diffuse fatty atrophy of the foot musculature. No findings for myofasciitis. IMPRESSION: 1. Osteomyelitis involving the distal fibula. Overlying soft tissue swelling and probable granulation tissue but no discrete drainable soft tissue abscess. 2. No findings for septic arthritis. 3. Tibiotalar, subtalar and midfoot fusions. Electronically Signed   By: Rudie Meyer M.D.   On: 10/29/2015 10:14   Dg  Chest Port 1 View  Result Date: 10/29/2015 CLINICAL DATA:  PICC line placement EXAM: PORTABLE CHEST 1 VIEW COMPARISON:  06/08/2015 FINDINGS: Borderline cardiomegaly. There is small left pleural effusion with left basilar atelectasis or infiltrate. Right arm PICC line with tip in SVC right atrium. No pneumothorax. No pulmonary edema. Degenerative changes bilateral shoulders. IMPRESSION: Right arm PICC line in place. No pneumothorax. Small left pleural effusion with left basilar atelectasis or infiltrate. No pulmonary edema. Electronically Signed   By: Natasha Mead M.D.   On: 10/29/2015 17:04   Dg Foot 2 Views Right  Result Date: 10/27/2015 CLINICAL DATA:  Ulcer of the left ankle. Chronic ulceration between the third and fourth digits. History of rheumatoid arthritis. EXAM: RIGHT FOOT - 2 VIEW COMPARISON:  None. FINDINGS: Diffuse bone demineralization. Extension deformities of the toes. Probable erosive changes at the metatarsal-phalangeal joints although visualization is limited because of positioning. These changes are likely related to the history of rheumatoid arthritis although focal osteomyelitis is not entirely excluded. Degenerative changes in the intertarsal joints. No radiopaque soft tissue foreign bodies. No soft tissue gas collections. Vascular calcifications. No acute fractures  identified. IMPRESSION: Extension deformities of the toes with probable erosive changes at the metatarsal-phalangeal joints of all toes. Changes are likely due to remote arthritis but osteomyelitis is not entirely excluded. Diffuse bone station. Electronically Signed   By: Burman NievesWilliam  Stevens M.D.   On: 10/27/2015 22:45     CBC  Recent Labs Lab 10/27/15 1646 10/28/15 0412 10/31/15 0416  WBC 7.4 6.5 6.0  HGB 10.8* 9.4* 9.4*  HCT 31.6* 27.1* 28.6*  PLT 288 229 215  MCV 87.0 86.6 87.2  MCH 29.6 29.9 28.7  MCHC 34.1 34.6 32.9  RDW 14.8* 14.5 14.6*  LYMPHSABS 0.6*  --   --   MONOABS 0.5  --   --   EOSABS 0.1  --    --   BASOSABS 0.1  --   --     Chemistries   Recent Labs Lab 10/27/15 1646 10/28/15 0412 10/29/15 0411 10/30/15 0337 10/31/15 0416  NA 132* 137 137  --  140  K 4.5 4.0 4.1  --  3.4*  CL 101 109 111  --  109  CO2 24 22 23   --  25  GLUCOSE 122* 83 85  --  93  BUN 28* 26* 20  --  13  CREATININE 1.40* 1.26* 1.57* 1.34* 1.25*  CALCIUM 9.2 9.1 9.0  --  8.9  AST 19  --   --   --   --   ALT 12*  --   --   --   --   ALKPHOS 93  --   --   --   --   BILITOT 0.2*  --   --   --   --    ------------------------------------------------------------------------------------------------------------------ estimated creatinine clearance is 32.3 mL/min (by C-G formula based on SCr of 1.25 mg/dL (H)). ------------------------------------------------------------------------------------------------------------------ No results for input(s): HGBA1C in the last 72 hours. ------------------------------------------------------------------------------------------------------------------ No results for input(s): CHOL, HDL, LDLCALC, TRIG, CHOLHDL, LDLDIRECT in the last 72 hours. ------------------------------------------------------------------------------------------------------------------ No results for input(s): TSH, T4TOTAL, T3FREE, THYROIDAB in the last 72 hours.  Invalid input(s): FREET3 ------------------------------------------------------------------------------------------------------------------ No results for input(s): VITAMINB12, FOLATE, FERRITIN, TIBC, IRON, RETICCTPCT in the last 72 hours.  Coagulation profile No results for input(s): INR, PROTIME in the last 168 hours.  No results for input(s): DDIMER in the last 72 hours.  Cardiac Enzymes No results for input(s): CKMB, TROPONINI, MYOGLOBIN in the last 168 hours.  Invalid input(s): CK ------------------------------------------------------------------------------------------------------------------ Invalid input(s):  POCBNP    Assessment & Plan    This is a 60 y.o. female with a history of rheumatoid arthritis, psoriatic arthritis, vascular dementia, alcohol abuse now being admitted with: 1. Acute osteomyelitis of the left lateral ankle  Appreciate podiatry inputPlan for or later today will need intraoperative bone cultures MRI confirms osteomyelitis PICC line placed Continue Zosyn as per ID superficial cultures are showing Enterobacter aerogenes 2. AK I-continue to monitor currently stable 3. Anemia, chronic, monitor CBC no need for transfusion  4. Hyponatremia, mild-improved with IV hydration 5. History of TIA/CVA-continue aspirin and hold if needed for surgery 6. History of vascular dementia-continue Aricept, folic acid 7. History of hypertension-elevated I'll restart her metoprolol     Code Status Orders        Start     Ordered   10/27/15 2114  Full code  Continuous     10/27/15 2113    Code Status History    Date Active Date Inactive Code Status Order ID Comments User Context   06/08/2015  2:45 PM 06/09/2015  4:36 PM Full Code [id number]  Shaune Pollack, MD ED           Consults  Podiatry   DVT Prophylaxis  Lovenox  Lab Results  Component Value Date   PLT 215 10/31/2015     Time Spent in minutes   25 minutes Greater than 50% of time spent in care coordination and counseling patient regarding the condition and plan of care.   Auburn Bilberry M.D on 10/31/2015 at 11:09 AM  Between 7am to 6pm - Pager - [phone]  After 6pm go to [email or web address] - password [email or web address] [facility]  [facility] [place] [facility]   Office  [phone]

## 2015-10-31 NOTE — Op Note (Signed)
Operative note   Surgeon:Julianna Vanwagner [employer] forces logistics/support/administrative officer: None    Preop diagnosis: Distal fibula osteomyelitis left ankle    Postop diagnosis: Same    Procedure: Excision distal fibula with wound VAC placement    EBL: 20 cc    Anesthesia:local and general    Hemostasis: Thigh tourniquet inflated to 250 mmHg for 39 minutes    Specimen: Osteomyelitic distal left fibula for culture and pathology    Complications: None    Operative indications:Ruth Price is an 60 y.o. that presents today for surgical intervention.  The risks/benefits/alternatives/complications have been discussed and consent has been given.    Procedure:  Patient was brought into the OR and placed on the operating table in thesupine position. After anesthesia was obtained theleft lower extremity was prepped and draped in usual sterile fashion.  Attention was directed to the distal lateral aspect of the fibula where a large ulceration was noted on the distal fibula. After inflation of the tourniquet longitudinal incision was made above and below the ulceration. Full-thickness incision was then carried out. The anterior posterior and distal aspect of the fibula was released of all soft tissue. Care was taken to retract the peroneal tendons. At this time a beveled cut of the distal fibula was then performed at the level of the ankle joint. This was then removed from the surgical field in toto. The bone was noted be quite brittle and osteoporotic. There was no remaining loose fragmented bone on the distal fibula. The tibiotalar joint was noted to be completely consolidated with no signs of joint at all. At this time the wound was then debrided with a versa jet. The skin was debrided with the versa jet as well. The wound was flushed with copious amounts or irrigation. The proximal distal aspect of the incision site within coapted with a 3-0 nylon. The ulceration had been debrided and a defect was noted. The wound was then packed  with stimulan calcium sulfate bones impregnated with vancomycin. A wound VAC was applied to the lateral ankle. Mepitel was applied overlying the open wound prior to placement of the wound VAC.    Patient tolerated the procedure and anesthesia well.  Was transported from the OR to the PACU with all vital signs stable and vascular status intact. To be discharged per routine protocol. Will be readmitted to the floor.

## 2015-10-31 NOTE — Care Management Important Message (Signed)
Important Message  Patient Details  Name: Ruth Price MRN: [id number] Date of Birth: 25-Sep-1955   Medicare Important Message Given:  Yes    Gwenette Greet, RN 10/31/2015, 7:48 AM

## 2015-10-31 NOTE — Progress Notes (Signed)
[facility] CLINIC INFECTIOUS DISEASE PROGRESS NOTE Date of Admission:  10/27/2015     ID: Ruth Price is a 60 y.o. female with osteomyelitis ankle  Active Problems:   Osteomyelitis of ankle (HCC)   Protein-calorie malnutrition, severe   Subjective: For surgery today. PICC placed  ROS  Eleven systems are reviewed and negative except per hpi  Medications:  Antibiotics Given (last 72 hours)    Date/Time Action Medication Dose Rate   10/28/15 1813 Given   piperacillin-tazobactam (ZOSYN) IVPB 3.375 g 3.375 g 12.5 mL/hr   10/28/15 2013 Given   vancomycin (VANCOCIN) IVPB 750 mg/150 ml premix 750 mg 150 mL/hr   10/29/15 0133 Given   piperacillin-tazobactam (ZOSYN) IVPB 3.375 g 3.375 g 12.5 mL/hr   10/29/15 1053 Given   piperacillin-tazobactam (ZOSYN) IVPB 3.375 g 3.375 g 12.5 mL/hr   10/29/15 1751 Given   piperacillin-tazobactam (ZOSYN) IVPB 3.375 g 3.375 g 12.5 mL/hr   10/29/15 2000 Given   vancomycin (VANCOCIN) 500 mg in sodium chloride 0.9 % 100 mL IVPB 500 mg 100 mL/hr   10/30/15 0144 Given   piperacillin-tazobactam (ZOSYN) IVPB 3.375 g 3.375 g 12.5 mL/hr   10/30/15 0930 Given   piperacillin-tazobactam (ZOSYN) IVPB 3.375 g 3.375 g 12.5 mL/hr   10/30/15 2018 Given   vancomycin (VANCOCIN) 500 mg in sodium chloride 0.9 % 100 mL IVPB 500 mg 100 mL/hr   10/31/15 0208 Given   piperacillin-tazobactam (ZOSYN) IVPB 3.375 g 3.375 g 12.5 mL/hr   10/31/15 1056 Given   piperacillin-tazobactam (ZOSYN) IVPB 3.375 g 3.375 g 12.5 mL/hr     . ALPRAZolam  0.5 mg Oral Daily  . aspirin  81 mg Oral Daily  . atorvastatin  40 mg Oral QHS  . calcium carbonate  600 mg of elemental calcium Oral Q breakfast  .  ceFAZolin (ANCEF) IV  2 g Intravenous On Call to OR  . Chlorhexidine Gluconate Cloth  6 each Topical Q0600  . donepezil  10 mg Oral QHS  . enoxaparin (LOVENOX) injection  30 mg Subcutaneous QHS  . folic acid  1 mg Oral Daily  . lisinopril  10 mg Oral Daily  . metoprolol tartrate  25 mg  Oral BID  . multivitamin with minerals  1 tablet Oral Daily  . mupirocin ointment  1 application Nasal BID  . omega-3 acid ethyl esters  2 g Oral Daily  . piperacillin-tazobactam (ZOSYN)  IV  3.375 g Intravenous Q8H  . vancomycin  500 mg Intravenous Q24H    Objective: Vital signs in last 24 hours: Temp:  [97.9 F (36.6 C)-98.1 F (36.7 C)] 97.9 F (36.6 C) (09/27 1242) Pulse Rate:  [55-73] 55 (09/27 1242) Resp:  [18-24] 18 (09/27 1242) BP: (152-178)/(53-81) 172/69 (09/27 1242) SpO2:  [91 %-99 %] 91 % (09/27 1242) Constitutional:  Chronically ill appearing, older then stated age oriented to person, place, and time.HENT: [place]/AT, PERRLA, no scleral icterus Mouth/Throat: Oropharynx is clear and moist. No oropharyngeal exudate.  Cardiovascular: Normal rate, regular rhythm and normal heart sounds. Pulmonary/Chest: Effort normal and breath sounds normal. No respiratory distress.  has no wheezes.  Neck = supple, no nuchal rigidity Abdominal: Soft. Bowel sounds are normal.  exhibits no distension. There is no tenderness.  Lymphadenopathy: no cervical adenopathy. No axillary adenopathy Neurological: alert and oriented to person, place, and time.  Skin:L lateral ankle with a 2 cm ulcer with surrounding redness, mod tan drainage, ttp Psychiatric: a normal mood and affect.  behavior is normal.   Lab Results  Recent Labs  10/29/15 0411 10/30/15 0337 10/31/15 0416  WBC  --   --  6.0  HGB  --   --  9.4*  HCT  --   --  28.6*  NA 137  --  140  K 4.1  --  3.4*  CL 111  --  109  CO2 23  --  25  BUN 20  --  13  CREATININE 1.57* 1.34* 1.25*   Lab Results  Component Value Date   ESRSEDRATE 51 (H) 10/31/2015   Lab Results  Component Value Date   CRP 5.1 (H) 10/31/2015   Microbiology: Results for orders placed or performed during the hospital encounter of 10/27/15  CULTURE, BLOOD (ROUTINE X 2) w Reflex to ID Panel     Status: None (Preliminary result)   Collection Time: 10/27/15  4:46  PM  Result Value Ref Range Status   Specimen Description BLOOD RIGHT ANTECUBITAL  Final   Special Requests BOTTLES DRAWN AEROBIC AND ANAEROBIC  10CC  Final   Culture NO GROWTH 4 DAYS  Final   Report Status PENDING  Incomplete  CULTURE, BLOOD (ROUTINE X 2) w Reflex to ID Panel     Status: None (Preliminary result)   Collection Time: 10/27/15  4:47 PM  Result Value Ref Range Status   Specimen Description BLOOD RIGHT WRIST  Final   Special Requests BOTTLES DRAWN AEROBIC AND ANAEROBIC  10CC  Final   Culture NO GROWTH 4 DAYS  Final   Report Status PENDING  Incomplete  Aerobic/Anaerobic Culture (surgical/deep wound)     Status: None (Preliminary result)   Collection Time: 10/28/15 10:24 AM  Result Value Ref Range Status   Specimen Description ANKLE  Final   Special Requests NONE  Final   Gram Stain   Final    RARE WBC PRESENT, PREDOMINANTLY PMN RARE GRAM POSITIVE COCCI IN PAIRS Performed at [facility]    Culture   Final    FEW ENTEROBACTER AEROGENES NO ANAEROBES ISOLATED; CULTURE IN PROGRESS FOR 5 DAYS    Report Status PENDING  Incomplete   Organism ID, Bacteria ENTEROBACTER AEROGENES  Final      Susceptibility   Enterobacter aerogenes - MIC*    CEFAZOLIN >=64 RESISTANT Resistant     CEFEPIME <=1 SENSITIVE Sensitive     CEFTAZIDIME <=1 SENSITIVE Sensitive     CEFTRIAXONE <=1 SENSITIVE Sensitive     CIPROFLOXACIN <=0.25 SENSITIVE Sensitive     GENTAMICIN <=1 SENSITIVE Sensitive     IMIPENEM 1 SENSITIVE Sensitive     TRIMETH/SULFA <=20 SENSITIVE Sensitive     PIP/TAZO <=4 SENSITIVE Sensitive     * FEW ENTEROBACTER AEROGENES  MRSA PCR Screening     Status: Abnormal   Collection Time: 10/31/15  4:22 AM  Result Value Ref Range Status   MRSA by PCR POSITIVE (A) NEGATIVE Final    Comment:        The GeneXpert MRSA Assay (FDA approved for NASAL specimens only), is one component of a comprehensive MRSA colonization surveillance program. It is not intended to diagnose  MRSA infection nor to guide or monitor treatment for MRSA infections. CRITICAL RESULT CALLED TO, READ BACK BY AND VERIFIED WITH: DRUSELLA JACKSON@0700  ON 10/31/15 BY HKP     Studies/Results: Ct Ankle Left Wo Contrast  Result Date: 10/29/2015 CLINICAL DATA:  Distal fibular osteomyelitis with overlying soft tissue swelling and possible abscess. EXAM: CT OF THE LEFT ANKLE WITHOUT CONTRAST TECHNIQUE: Multidetector CT imaging of the left ankle  was performed according to the standard protocol. Multiplanar CT image reconstructions were also generated. COMPARISON:  10/29/2015 MRI FINDINGS: Bones/Joint/Cartilage MRI confirms an erosion along the inferior fibular tip were there is extensive spurring, corresponding to the focus of edema and cortical destruction on the MRI. This cortical defect is shown on image 72/5 and on image 72/6. Solid hindfoot and midfoot and midfoot fusion noted. Severe arthropathy at the Lisfranc joint, with fusion of the bases of the fourth and fifth metatarsals and questionable partial fusion of the bases of the second and third metatarsals. Plantar calcaneal spur. On image 125/8 there is a chronic nonunited fracture of the distal fifth metatarsal. Ligaments Suboptimally assessed by CT. Muscles and Tendons Grossly unremarkable. Soft tissues Plantar edema along the heel, especially below the plantar spur. Vascular calcifications noted. IMPRESSION: 1. Erosion laterally along the inferior fibular tip corresponding to the defect on MRI, and suspicious for focal osteomyelitis. 2. 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 Advanced arthropathy at the Lisfranc joint. 3. Chronic nonunited fracture of the distal fifth metatarsal. 4. Plantar calcaneal spur, with underlying edema in the subcutaneous tissues potentially from plantar fasciitis. 5. Atherosclerotic vascular calcifications. Electronically Signed   By: Gaylyn Rong  M.D.   On: 10/29/2015 19:52   Dg Chest Port 1 View  Result Date: 10/29/2015 CLINICAL DATA:  PICC line placement EXAM: PORTABLE CHEST 1 VIEW COMPARISON:  06/08/2015 FINDINGS: Borderline cardiomegaly. There is small left pleural effusion with left basilar atelectasis or infiltrate. Right arm PICC line with tip in SVC right atrium. No pneumothorax. No pulmonary edema. Degenerative changes bilateral shoulders. IMPRESSION: Right arm PICC line in place. No pneumothorax. Small left pleural effusion with left basilar atelectasis or infiltrate. No pulmonary edema. Electronically Signed   By: Natasha Mead M.D.   On: 10/29/2015 17:04    Assessment/Plan: Ruth Price is a 60 y.o. female with advanced RA not on immunosuppressants admitted with worsening L lateral ankle wound and increased drainage with MRI and CT confirmng osteomyelitis and cx mixed but with enterobacter. For surgery 9/27.    Recommendations Send bone for culture if possible I think she will need IV abx for 6 weeks  Will plan to use zosyn unless cultures reveal resistant pathogens Thank you very much for the consult. Will follow with you.  Ruth Price P   10/31/2015, 2:45 PM

## 2015-10-31 NOTE — Plan of Care (Signed)
Problem: Skin Integrity: Goal: Risk for impaired skin integrity will decrease Outcome: Not Progressing Pt currently scheduled for surgery today to have a wound debridement and wound vac.

## 2015-11-01 ENCOUNTER — Encounter: Payer: Self-pay | Admitting: Podiatry

## 2015-11-01 LAB — CULTURE, BLOOD (ROUTINE X 2)
CULTURE: NO GROWTH
CULTURE: NO GROWTH

## 2015-11-01 LAB — VANCOMYCIN, TROUGH: Vancomycin Tr: 16 ug/mL (ref 15–20)

## 2015-11-01 NOTE — Progress Notes (Signed)
Pharmacy Antibiotic Note  Ruth Price is a 60 y.o. female admitted on 10/27/2015 with osteomyelitis.  Pharmacy has been consulted for Zosyn and vancomycin dosing.  Plan: Scr has worsened. CrCl=25.65ml/min. Will decrease dose to 500mg  q 24 hours. Check trough prior to the 4th new dose. 9/28 @ 1930.  Continue zosyn 3.375g q 8hr EI dosing.  Continue to monitor renal function closely.   9/28:  VT @ 19:30 = 16 mcg/mL  Will continue this pt on current Vancomycin 500 mg IV Q24H.    Height: 5\' 5"  (165.1 cm) Weight: 96 lb 9.6 oz (43.8 kg) IBW/kg (Calculated) : 57  Temp (24hrs), Avg:98 F (36.7 C), Min:97.6 F (36.4 C), Max:98.4 F (36.9 C)   Recent Labs Lab 10/27/15 1646 10/27/15 1647 10/28/15 0412 10/29/15 0411 10/30/15 0337 10/31/15 0416 11/01/15 1937  WBC 7.4  --  6.5  --   --  6.0  --   CREATININE 1.40*  --  1.26* 1.57* 1.34* 1.25*  --   LATICACIDVEN  --  1.3  --   --   --   --   --   VANCOTROUGH  --   --   --   --   --   --  16    Estimated Creatinine Clearance: 32.3 mL/min (by C-G formula based on SCr of 1.25 mg/dL (H)).    Allergies  Allergen Reactions  . Levofloxacin Other (See Comments)    Reaction:  Unknown      Thank you for allowing pharmacy to be a part of this patient's care.  Suraya Vidrine D, Pharm.D. Clinical Pharmacist 11/01/2015 8:33 PM

## 2015-11-01 NOTE — Progress Notes (Signed)
[facility] CLINIC INFECTIOUS DISEASE PROGRESS NOTE Date of Admission:  10/27/2015     ID: Ruth Price is a 60 y.o. female with osteomyelitis ankle  Active Problems:   Osteomyelitis of ankle (HCC)   Protein-calorie malnutrition, severe   Subjective: Had surgery - wound vac in place  ROS  Eleven systems are reviewed and negative except per hpi  Medications:  Antibiotics Given (last 72 hours)    Date/Time Action Medication Dose Rate   10/29/15 1053 Given   piperacillin-tazobactam (ZOSYN) IVPB 3.375 g 3.375 g 12.5 mL/hr   10/29/15 1751 Given   piperacillin-tazobactam (ZOSYN) IVPB 3.375 g 3.375 g 12.5 mL/hr   10/29/15 2000 Given   vancomycin (VANCOCIN) 500 mg in sodium chloride 0.9 % 100 mL IVPB 500 mg 100 mL/hr   10/30/15 0144 Given   piperacillin-tazobactam (ZOSYN) IVPB 3.375 g 3.375 g 12.5 mL/hr   10/30/15 0930 Given   piperacillin-tazobactam (ZOSYN) IVPB 3.375 g 3.375 g 12.5 mL/hr   10/30/15 2018 Given   vancomycin (VANCOCIN) 500 mg in sodium chloride 0.9 % 100 mL IVPB 500 mg 100 mL/hr   10/31/15 0208 Given   piperacillin-tazobactam (ZOSYN) IVPB 3.375 g 3.375 g 12.5 mL/hr   10/31/15 1056 Given   piperacillin-tazobactam (ZOSYN) IVPB 3.375 g 3.375 g 12.5 mL/hr   10/31/15 1719 Given   ceFAZolin (ANCEF) IVPB 2g/100 mL premix 2 g    10/31/15 2137 Given   vancomycin (VANCOCIN) 500 mg in sodium chloride 0.9 % 100 mL IVPB 500 mg 100 mL/hr   11/01/15 0231 Given   piperacillin-tazobactam (ZOSYN) IVPB 3.375 g 3.375 g 12.5 mL/hr     . ALPRAZolam  0.5 mg Oral Daily  . aspirin  81 mg Oral Daily  . atorvastatin  40 mg Oral QHS  . calcium carbonate  600 mg of elemental calcium Oral Q breakfast  . Chlorhexidine Gluconate Cloth  6 each Topical Q0600  . donepezil  10 mg Oral QHS  . enoxaparin (LOVENOX) injection  30 mg Subcutaneous QHS  . folic acid  1 mg Oral Daily  . lisinopril  10 mg Oral Daily  . metoprolol tartrate  25 mg Oral BID  . multivitamin with minerals  1 tablet Oral  Daily  . mupirocin ointment  1 application Nasal BID  . omega-3 acid ethyl esters  2 g Oral Daily  . piperacillin-tazobactam (ZOSYN)  IV  3.375 g Intravenous Q8H  . vancomycin  500 mg Intravenous Q24H    Objective: Vital signs in last 24 hours: Temp:  [97.3 F (36.3 C)-98.8 F (37.1 C)] 97.8 F (36.6 C) (09/28 0828) Pulse Rate:  [55-130] 58 (09/28 0828) Resp:  [12-18] 16 (09/28 0828) BP: (140-178)/(53-88) 162/62 (09/28 0828) SpO2:  [91 %-100 %] 99 % (09/28 0828) Constitutional:  Chronically ill appearing, older then stated age oriented to person, place, and time.HENT: [place]/AT, PERRLA, no scleral icterus Mouth/Throat: Oropharynx is clear and moist. No oropharyngeal exudate.  Cardiovascular: Normal rate, regular rhythm and normal heart sounds. Pulmonary/Chest: Effort normal and breath sounds normal. No respiratory distress.  has no wheezes.  Neck = supple, no nuchal rigidity Abdominal: Soft. Bowel sounds are normal.  exhibits no distension. There is no tenderness.  Lymphadenopathy: no cervical adenopathy. No axillary adenopathy Neurological: alert and oriented to person, place, and time.  Skin:L lateral ankle wound vac in place Psychiatric: a normal mood and affect.  behavior is normal.   Lab Results  Recent Labs  10/30/15 0337 10/31/15 0416  WBC  --  6.0  HGB  --  9.4*  HCT  --  28.6*  NA  --  140  K  --  3.4*  CL  --  109  CO2  --  25  BUN  --  13  CREATININE 1.34* 1.25*   Lab Results  Component Value Date   ESRSEDRATE 51 (H) 10/31/2015   Lab Results  Component Value Date   CRP 5.1 (H) 10/31/2015   Microbiology: Results for orders placed or performed during the hospital encounter of 10/27/15  CULTURE, BLOOD (ROUTINE X 2) w Reflex to ID Panel     Status: None   Collection Time: 10/27/15  4:46 PM  Result Value Ref Range Status   Specimen Description BLOOD RIGHT ANTECUBITAL  Final   Special Requests BOTTLES DRAWN AEROBIC AND ANAEROBIC  10CC  Final   Culture NO  GROWTH 5 DAYS  Final   Report Status 11/01/2015 FINAL  Final  CULTURE, BLOOD (ROUTINE X 2) w Reflex to ID Panel     Status: None   Collection Time: 10/27/15  4:47 PM  Result Value Ref Range Status   Specimen Description BLOOD RIGHT WRIST  Final   Special Requests BOTTLES DRAWN AEROBIC AND ANAEROBIC  10CC  Final   Culture NO GROWTH 5 DAYS  Final   Report Status 11/01/2015 FINAL  Final  Aerobic/Anaerobic Culture (surgical/deep wound)     Status: None (Preliminary result)   Collection Time: 10/28/15 10:24 AM  Result Value Ref Range Status   Specimen Description ANKLE  Final   Special Requests NONE  Final   Gram Stain   Final    RARE WBC PRESENT, PREDOMINANTLY PMN RARE GRAM POSITIVE COCCI IN PAIRS Performed at [facility]    Culture   Final    FEW ENTEROBACTER AEROGENES MODERATE METHICILLIN RESISTANT STAPHYLOCOCCUS AUREUS NO ANAEROBES ISOLATED; CULTURE IN PROGRESS FOR 5 DAYS    Report Status PENDING  Incomplete   Organism ID, Bacteria ENTEROBACTER AEROGENES  Final   Organism ID, Bacteria METHICILLIN RESISTANT STAPHYLOCOCCUS AUREUS  Final      Susceptibility   Enterobacter aerogenes - MIC*    CEFAZOLIN >=64 RESISTANT Resistant     CEFEPIME <=1 SENSITIVE Sensitive     CEFTAZIDIME <=1 SENSITIVE Sensitive     CEFTRIAXONE <=1 SENSITIVE Sensitive     CIPROFLOXACIN <=0.25 SENSITIVE Sensitive     GENTAMICIN <=1 SENSITIVE Sensitive     IMIPENEM 1 SENSITIVE Sensitive     TRIMETH/SULFA <=20 SENSITIVE Sensitive     PIP/TAZO <=4 SENSITIVE Sensitive     * FEW ENTEROBACTER AEROGENES   Methicillin resistant staphylococcus aureus - MIC*    CIPROFLOXACIN >=8 RESISTANT Resistant     ERYTHROMYCIN >=8 RESISTANT Resistant     GENTAMICIN <=0.5 SENSITIVE Sensitive     OXACILLIN >=4 RESISTANT Resistant     TETRACYCLINE <=1 SENSITIVE Sensitive     VANCOMYCIN 1 SENSITIVE Sensitive     TRIMETH/SULFA <=10 SENSITIVE Sensitive     CLINDAMYCIN >=8 RESISTANT Resistant     RIFAMPIN <=0.5 SENSITIVE  Sensitive     Inducible Clindamycin NEGATIVE Sensitive     * MODERATE METHICILLIN RESISTANT STAPHYLOCOCCUS AUREUS  MRSA PCR Screening     Status: Abnormal   Collection Time: 10/31/15  4:22 AM  Result Value Ref Range Status   MRSA by PCR POSITIVE (A) NEGATIVE Final    Comment:        The GeneXpert MRSA Assay (FDA approved for NASAL specimens only), is one component of a comprehensive MRSA colonization surveillance program. It  is not intended to diagnose MRSA infection nor to guide or monitor treatment for MRSA infections. CRITICAL RESULT CALLED TO, READ BACK BY AND VERIFIED WITH: DRUSELLA JACKSON@0700  ON 10/31/15 BY HKP   Aerobic/Anaerobic Culture (surgical/deep wound)     Status: None (Preliminary result)   Collection Time: 10/31/15  5:30 PM  Result Value Ref Range Status   Specimen Description WOUND  Final   Special Requests LEFT ANKLE  Final   Gram Stain   Final    FEW WBC PRESENT,BOTH PMN AND MONONUCLEAR NO ORGANISMS SEEN    Culture   Final    NO GROWTH < 24 HOURS Performed at [facility]    Report Status PENDING  Incomplete    Studies/Results: No results found.  Assessment/Plan: Ruth Price is a 60 y.o. female with advanced RA not on immunosuppressants admitted with worsening L lateral ankle wound and increased drainage with MRI and CT confirmng osteomyelitis and cx mixed but with enterobacter and MRSA S/Price surgery 9/27 with removal of infected bone and wound vac placement Bone cx pending Recommendations Plan for  IV abx for 6 weeks  Will plan to use zosyn and vanco given MRSA and enterobacter plus likely mixed  See abx order sheet  Thank you very much for the consult. Will follow with you.  Ruth Price   11/01/2015, 10:44 AM

## 2015-11-01 NOTE — Clinical Social Work Placement (Signed)
   CLINICAL SOCIAL WORK PLACEMENT  NOTE  Date:  11/01/2015  Patient Details  Name: Ruth Price MRN: [id number] Date of Birth: [birth year]  Clinical Social Work is seeking post-discharge placement for this patient at the Skilled  Nursing Facility level of care (*CSW will initial, date and re-position this form in  chart as items are completed):  Yes   Patient/family provided with [place] Clinical Social Work Department's list of facilities offering this level of care within the geographic area requested by the patient (or if unable, by the patient's family).  Yes   Patient/family informed of their freedom to choose among providers that offer the needed level of care, that participate in Medicare, Medicaid or managed care program needed by the patient, have an available bed and are willing to accept the patient.  Yes   Patient/family informed of [facility]'s ownership interest in [facility] and [facility], as well as of the fact that they are under no obligation to receive care at these facilities.  PASRR submitted to EDS on       PASRR number received on       Existing PASRR number confirmed on 11/01/15     FL2 transmitted to all facilities in geographic area requested by pt/family on 11/01/15     FL2 transmitted to all facilities within larger geographic area on       Patient informed that his/her managed care company has contracts with or will negotiate with certain facilities, including the following:            Patient/family informed of bed offers received.  Patient chooses bed at       Physician recommends and patient chooses bed at      Patient to be transferred to   on  .  Patient to be transferred to facility by       Patient family notified on   of transfer.  Name of family member notified:        PHYSICIAN       Additional Comment:    _______________________________________________ Teresita Fanton, Darleen Crocker, LCSW 11/01/2015, 4:18 PM

## 2015-11-01 NOTE — Clinical Social Work Note (Signed)
Clinical Social Work Assessment  Patient Details  Name: Ruth Price MRN: [id number] Date of Birth: [birth year]  Date of referral:  11/01/15               Reason for consult:  Facility Placement                Permission sought to share information with:  Chartered certified accountant granted to share information::  Yes, Verbal Permission Granted  Name::      [place]::   [place]   Relationship::     Contact Information:     Housing/Transportation Living arrangements for the past 2 months:  [place] of Information:  Patient Patient Interpreter Needed:  None Criminal Activity/Legal Involvement Pertinent to Current Situation/Hospitalization:  No - Comment as needed Significant Relationships:  Adult Children Lives with:  Adult Children Do you feel safe going back to the place where you live?  Yes Need for family participation in patient care:  Yes (Comment)  Care giving concerns:  Patient lives in [place] with her son Ruth Price and daughter in Sports coach.    Social Worker assessment / plan:  Holiday representative (CSW) received SNF consult for IV Abx and a wound vac. CSW met with patient alone at bedside to discuss D/C plan. Patient was alert and oriented and laying in the bed. Patient is hard of hearing. CSW introduced self and explained role of CSW department. Per patient she lives in [place] with her son Ruth Price, daughter in Sports coach and grandchildren. CSW explained SNF option and that patient's insurance [facility] will require authorization. Per patient she was at [facility] in July 2017 and said it was a nice place. Patient reported that she would consider going to [facility] again. Patient is agreeable to SNF search in [facility]. FL2 complete and faxed out. CSW will continue to follow and assist as needed.   RN came and found CSW after assessment and stated that patient requested to not go back to [facility]. CSW will follow up with patient and provide bed offers.   Employment status:  Disabled (Comment on whether or not currently receiving Disability) Insurance information:  Managed Medicare PT Recommendations:  Not assessed at this time Information / Referral to community resources:  [place]  Patient/Family's Response to care:  Patient is agreeable to SNF search in [place].   Patient/Family's Understanding of and Emotional Response to Diagnosis, Current Treatment, and Prognosis:  Patient was pleasant and thanked CSW for visit.   Emotional Assessment Appearance:  Appears stated age Attitude/Demeanor/Rapport:    Affect (typically observed):  Accepting, Adaptable, Pleasant Orientation:  Oriented to Self, Oriented to Place, Oriented to  Time, Oriented to Situation Alcohol / Substance use:  Not Applicable Psych involvement (Current and /or in the community):  No (Comment)  Discharge Needs  Concerns to be addressed:  Discharge Planning Concerns Readmission within the last 30 days:  No Current discharge risk:  Chronically ill, Dependent with Mobility Barriers to Discharge:  Continued Medical Work up   [employer], Veronia Beets, LCSW 11/01/2015, 4:20 PM

## 2015-11-01 NOTE — Progress Notes (Signed)
Nutrition Follow-up  DOCUMENTATION CODES:   Severe malnutrition in context of chronic illness  INTERVENTION:  -Encourage PO intake, pt refuses ONS -Provide snacks ad libitum  NUTRITION DIAGNOSIS:   Malnutrition related to chronic illness as evidenced by severe depletion of body fat, severe depletion of muscle mass. -ongoing  GOAL:   Patient will meet greater than or equal to 90% of their needs progressing  MONITOR:   PO intake, Weight trends  REASON FOR ASSESSMENT:    (low BMI)    ASSESSMENT:  Pt continues to have poor PO intake, consumed ~50% this morning per RN. Refuses ONS She is s/p surgery with removal of infected bone and wound vac placement On enteric precautions Labs and medications reviewed: K 3.4 Folic Acid, Omega 3's, Tums, MVI w/ minerals NS @ 24mL/hr  Diet Order:  Diet regular Room service appropriate? Yes; Fluid consistency: Thin  Skin:  Reviewed, no issues  Last BM:  9/24, noted hard stool   Height:   Ht Readings from Last 1 Encounters:  10/27/15 5\' 5"  (1.651 m)    Weight:   Wt Readings from Last 1 Encounters:  10/27/15 96 lb 9.6 oz (43.8 kg)    Ideal Body Weight:     BMI:  Body mass index is 16.08 kg/m.  Estimated Nutritional Needs:   Kcal:  1320-1540 kcals/d  Protein:  53-66 g/d  Fluid:  >/=1364ml/d  EDUCATION NEEDS:   Education needs addressed  80m. Terrianne Cavness, MS, RD LDN Inpatient Clinical Dietitian Pager [phone]

## 2015-11-01 NOTE — Plan of Care (Signed)
Problem: Skin Integrity: Goal: Risk for impaired skin integrity will decrease Outcome: Progressing Pt is progressing toward discharge tomorrow, is tolerating wound vac,is positioning self in the bed. Pt with poor appetite, ivf continues, has had bm today. Pt received percocet at 1800 for c/o incresing pain, pt was pain free after receiving morphine ivp earlier. Call bell inr each.

## 2015-11-01 NOTE — Progress Notes (Signed)
[facility] - [place] at [facility]                                                                                                                                                                                            Patient Demographics   Ruth Price, is a 60 y.o. female, DOB - 04-29-1955, ZOX:[id number]  Admit date - 10/27/2015   Admitting Physician Tonye Royalty, DO  Outpatient Primary MD for the patient is ALCALA, BETH C, NP   LOS - 5  Subjective: Patient has bleeding from her wound VAC site podiatry will be seeing her otherwise denying any complaints  Review of Systems:   CONSTITUTIONAL: No documented fever. No fatigue, weakness. No weight gain, no weight loss.  EYES: No blurry or double vision.  ENT: No tinnitus. No postnasal drip. No redness of the oropharynx.  RESPIRATORY: No cough, no wheeze, no hemoptysis. No dyspnea.  CARDIOVASCULAR: No chest pain. No orthopnea. No palpitations. No syncope.  GASTROINTESTINAL: No nausea, no vomiting or diarrhea. No abdominal pain. No melena or hematochezia.  GENITOURINARY: No dysuria or hematuria.  ENDOCRINE: No polyuria or nocturia. No heat or cold intolerance.  HEMATOLOGY: No anemia. No bruising. No bleeding.  INTEGUMENTARY: No rashes. No lesions.  MUSCULOSKELETAL: Positive left ankle pain NEUROLOGIC: No numbness, tingling, or ataxia. No seizure-type activity.  PSYCHIATRIC: No anxiety. No insomnia. No ADD.    Vitals:   Vitals:   10/31/15 2215 11/01/15 0008 11/01/15 0438 11/01/15 0828  BP: 140/68 (!) 161/75 (!) 155/81 (!) 162/62  Pulse: 60 (!) 59 (!) 130 (!) 58  Resp: 16 18 18 16   Temp: 98.2 F (36.8 C) 97.6 F (36.4 C) 98 F (36.7 C) 97.8 F (36.6 C)  TempSrc: Axillary Oral  Oral  SpO2:  95% 95% 99%  Weight:      Height:        Wt Readings from Last 3 Encounters:  10/27/15 96 lb 9.6 oz (43.8 kg)  06/08/15 101 lb 11.2 oz (46.1 kg)     Intake/Output Summary (Last 24 hours) at  11/01/15 1419 Last data filed at 11/01/15 1146  Gross per 24 hour  Intake          3318.75 ml  Output              375 ml  Net          2943.75 ml    Physical Exam:   GENERAL: Pleasant-appearing in no apparent distress.  HEAD, EYES, EARS, NOSE AND THROAT: Atraumatic, normocephalic. Extraocular muscles are intact. Pupils equal and reactive to light. Sclerae anicteric. No conjunctival injection. No  oro-pharyngeal erythema.  NECK: Supple. There is no jugular venous distention. No bruits, no lymphadenopathy, no thyromegaly.  HEART: Regular rate and rhythm,. No murmurs, no rubs, no clicks.  LUNGS: Clear to auscultation bilaterally. No rales or rhonchi. No wheezes.  ABDOMEN: Soft, flat, nontender, nondistended. Has good bowel sounds. No hepatosplenomegaly appreciated.  EXTREMITIES: Wound VAC in the left ankle with bleeding NEUROLOGIC: The patient is alert, awake, and oriented x3 with no focal motor or sensory deficits appreciated bilaterally.  SKIN: Moist and warm with no rashes appreciated.  Psych: Not anxious, depressed LN: No inguinal LN enlargement    Antibiotics   Anti-infectives    Start     Dose/Rate Route Frequency Ordered Stop   10/31/15 0043  ceFAZolin (ANCEF) IVPB 2g/100 mL premix     2 g 200 mL/hr over 30 Minutes Intravenous On call to O.R. 10/31/15 0043 10/31/15 1734   10/29/15 2000  vancomycin (VANCOCIN) 500 mg in sodium chloride 0.9 % 100 mL IVPB     500 mg 100 mL/hr over 60 Minutes Intravenous Every 24 hours 10/29/15 0742     10/28/15 2000  vancomycin (VANCOCIN) IVPB 750 mg/150 ml premix  Status:  Discontinued     750 mg 150 mL/hr over 60 Minutes Intravenous Every 24 hours 10/28/15 1510 10/29/15 0742   10/28/15 1800  vancomycin (VANCOCIN) IVPB 1000 mg/200 mL premix  Status:  Discontinued     1,000 mg 200 mL/hr over 60 Minutes Intravenous Every 36 hours 10/27/15 2130 10/28/15 1510   10/28/15 0000  piperacillin-tazobactam (ZOSYN) IVPB 3.375 g     3.375 g 12.5 mL/hr  over 240 Minutes Intravenous Every 8 hours 10/27/15 2130     10/27/15 1745  vancomycin (VANCOCIN) IVPB 750 mg/150 ml premix     750 mg 150 mL/hr over 60 Minutes Intravenous  Once 10/27/15 1735 10/27/15 1853   10/27/15 1745  piperacillin-tazobactam (ZOSYN) IVPB 3.375 g     3.375 g 100 mL/hr over 30 Minutes Intravenous  Once 10/27/15 1735 10/27/15 2051      Medications   Scheduled Meds: . ALPRAZolam  0.5 mg Oral Daily  . aspirin  81 mg Oral Daily  . atorvastatin  40 mg Oral QHS  . calcium carbonate  600 mg of elemental calcium Oral Q breakfast  . Chlorhexidine Gluconate Cloth  6 each Topical Q0600  . donepezil  10 mg Oral QHS  . enoxaparin (LOVENOX) injection  30 mg Subcutaneous QHS  . folic acid  1 mg Oral Daily  . lisinopril  10 mg Oral Daily  . metoprolol tartrate  25 mg Oral BID  . multivitamin with minerals  1 tablet Oral Daily  . mupirocin ointment  1 application Nasal BID  . omega-3 acid ethyl esters  2 g Oral Daily  . piperacillin-tazobactam (ZOSYN)  IV  3.375 g Intravenous Q8H  . vancomycin  500 mg Intravenous Q24H   Continuous Infusions: . sodium chloride 1,000 mL (10/31/15 1851)   PRN Meds:.acetaminophen **OR** acetaminophen, bisacodyl, magnesium citrate, morphine injection, ondansetron **OR** ondansetron (ZOFRAN) IV, oxyCODONE-acetaminophen, senna-docusate, sodium chloride, zolpidem   Data Review:   Micro Results Recent Results (from the past 240 hour(s))  CULTURE, BLOOD (ROUTINE X 2) w Reflex to ID Panel     Status: None   Collection Time: 10/27/15  4:46 PM  Result Value Ref Range Status   Specimen Description BLOOD RIGHT ANTECUBITAL  Final   Special Requests BOTTLES DRAWN AEROBIC AND ANAEROBIC  10CC  Final   Culture NO GROWTH  5 DAYS  Final   Report Status 11/01/2015 FINAL  Final  CULTURE, BLOOD (ROUTINE X 2) w Reflex to ID Panel     Status: None   Collection Time: 10/27/15  4:47 PM  Result Value Ref Range Status   Specimen Description BLOOD RIGHT WRIST   Final   Special Requests BOTTLES DRAWN AEROBIC AND ANAEROBIC  10CC  Final   Culture NO GROWTH 5 DAYS  Final   Report Status 11/01/2015 FINAL  Final  Aerobic/Anaerobic Culture (surgical/deep wound)     Status: None (Preliminary result)   Collection Time: 10/28/15 10:24 AM  Result Value Ref Range Status   Specimen Description ANKLE  Final   Special Requests NONE  Final   Gram Stain   Final    RARE WBC PRESENT, PREDOMINANTLY PMN RARE GRAM POSITIVE COCCI IN PAIRS Per[facility]    Culture   Final    FEW ENTEROBACTER AEROGENES MODERATE METHICILLIN RESISTANT STAPHYLOCOCCUS AUREUS NO ANAEROBES ISOLATED; CULTURE IN PROGRESS FOR 5 DAYS    Report Status PENDING  Incomplete   Organism ID, Bacteria ENTEROBACTER AEROGENES  Final   Organism ID, Bacteria METHICILLIN RESISTANT STAPHYLOCOCCUS AUREUS  Final      Susceptibility   Enterobacter aerogenes - MIC*    CEFAZOLIN >=64 RESISTANT Resistant     CEFEPIME <=1 SENSITIVE Sensitive     CEFTAZIDIME <=1 SENSITIVE Sensitive     CEFTRIAXONE <=1 SENSITIVE Sensitive     CIPROFLOXACIN <=0.25 SENSITIVE Sensitive     GENTAMICIN <=1 SENSITIVE Sensitive     IMIPENEM 1 SENSITIVE Sensitive     TRIMETH/SULFA <=20 SENSITIVE Sensitive     PIP/TAZO <=4 SENSITIVE Sensitive     * FEW ENTEROBACTER AEROGENES   Methicillin resistant staphylococcus aureus - MIC*    CIPROFLOXACIN >=8 RESISTANT Resistant     ERYTHROMYCIN >=8 RESISTANT Resistant     GENTAMICIN <=0.5 SENSITIVE Sensitive     OXACILLIN >=4 RESISTANT Resistant     TETRACYCLINE <=1 SENSITIVE Sensitive     VANCOMYCIN 1 SENSITIVE Sensitive     TRIMETH/SULFA <=10 SENSITIVE Sensitive     CLINDAMYCIN >=8 RESISTANT Resistant     RIFAMPIN <=0.5 SENSITIVE Sensitive     Inducible Clindamycin NEGATIVE Sensitive     * MODERATE METHICILLIN RESISTANT STAPHYLOCOCCUS AUREUS  MRSA PCR Screening     Status: Abnormal   Collection Time: 10/31/15  4:22 AM  Result Value Ref Range Status   MRSA by PCR  POSITIVE (A) NEGATIVE Final    Comment:        The GeneXpert MRSA Assay (FDA approved for NASAL specimens only), is one component of a comprehensive MRSA colonization surveillance program. It is not intended to diagnose MRSA infection nor to guide or monitor treatment for MRSA infections. CRITICAL RESULT CALLED TO, READ BACK BY AND VERIFIED WITH: DRUSELLA JACKSON@0700  ON 10/31/15 BY HKP   Aerobic/Anaerobic Culture (surgical/deep wound)     Status: None (Preliminary result)   Collection Time: 10/31/15  5:30 PM  Result Value Ref Range Status   Specimen Description WOUND  Final   Special Requests LEFT ANKLE  Final   Gram Stain   Final    FEW WBC PRESENT,BOTH PMN AND MONONUCLEAR NO ORGANISMS SEEN    Culture   Final    NO GROWTH < 24 HOURS Performed [facility]    Report Status PENDING  Incomplete    Radiology Reports Dg Ankle Complete Left  Result Date: 10/27/2015 CLINICAL DATA:  Nonhealing wound in the  lateral left ankle for 1 year. EXAM: LEFT ANKLE COMPLETE - 3+ VIEW COMPARISON:  None. FINDINGS: Diffuse osteopenia. Vascular calcifications throughout the soft tissues. 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 There is soft tissue swelling with soft tissue defect in the lateral left ankle. There is underlying mild cortical erosion at the lower left lateral malleolus. There is a tiny amount of soft tissue gas deep to the soft tissue defect in the lateral left ankle. Small plantar left calcaneal spur. IMPRESSION: 1. Acute osteomyelitis in the lower left lateral malleolus with associated overlying soft tissue defect and subcutaneous emphysema. 2. 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 3. Diffuse osteopenia. Electronically Signed   By: Delbert Phenix M.D.   On: 10/27/2015 16:51   Ct Ankle Left Wo Contrast  Result Date: 10/29/2015 CLINICAL DATA:  Distal fibular osteomyelitis with overlying soft tissue swelling and possible  abscess. EXAM: CT OF THE LEFT ANKLE WITHOUT CONTRAST TECHNIQUE: Multidetector CT imaging of the left ankle was performed according to the standard protocol. Multiplanar CT image reconstructions were also generated. COMPARISON:  10/29/2015 MRI FINDINGS: Bones/Joint/Cartilage MRI confirms an erosion along the inferior fibular tip were there is extensive spurring, corresponding to the focus of edema and cortical destruction on the MRI. This cortical defect is shown on image 72/5 and on image 72/6. Solid hindfoot and midfoot and midfoot fusion noted. Severe arthropathy at the Lisfranc joint, with fusion of the bases of the fourth and fifth metatarsals and questionable partial fusion of the bases of the second and third metatarsals. Plantar calcaneal spur. On image 125/8 there is a chronic nonunited fracture of the distal fifth metatarsal. Ligaments Suboptimally assessed by CT. Muscles and Tendons Grossly unremarkable. Soft tissues Plantar edema along the heel, especially below the plantar spur. Vascular calcifications noted. IMPRESSION: 1. Erosion laterally along the inferior fibular tip corresponding to the defect on MRI, and suspicious for focal osteomyelitis. 2. 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 Advanced arthropathy at the Lisfranc joint. 3. Chronic nonunited fracture of the distal fifth metatarsal. 4. Plantar calcaneal spur, with underlying edema in the subcutaneous tissues potentially from plantar fasciitis. 5. Atherosclerotic vascular calcifications. Electronically Signed   By: Gaylyn Rong M.D.   On: 10/29/2015 19:52   Mr Ankle Left  Wo Contrast  Result Date: 10/29/2015 CLINICAL DATA:  Chronic left ankle ulcer.  Persistent drainage. EXAM: MRI OF THE LEFT ANKLE WITHOUT CONTRAST TECHNIQUE: Multiplanar, multisequence MR imaging of the ankle was performed. No intravenous contrast was administered. COMPARISON:  Radiographs 10/27/2015  FINDINGS: TENDONS Peroneal: Intact. Posteromedial: Intact. Anterior: Intact. Achilles: Intact. Plantar Fascia: Intact. LIGAMENTS Lateral: Intact. Medial: Intact. CARTILAGE Ankle Joint: The tibiotalar joint is fused. Subtalar Joints/Sinus Tarsi: The subtalar joints are fused and the midfoot joints are fused. The tarsal metatarsal joints are not fused but are severely degenerated. Bones: Edema like signal abnormality in the lateral malleolus consistent with osteomyelitis. There is overlying soft tissue swelling and probable granulation tissue but no discrete drainable abscess. No other areas of osteomyelitis are identified. Other: Mild diffuse fatty atrophy of the foot musculature. No findings for myofasciitis. IMPRESSION: 1. Osteomyelitis involving the distal fibula. Overlying soft tissue swelling and probable granulation tissue but no discrete drainable soft tissue abscess. 2. No findings for septic arthritis. 3. Tibiotalar, subtalar and midfoot fusions. Electronically Signed   By: Rudie Meyer M.D.   On: 10/29/2015 10:14   Dg Chest  Port 1 View  Result Date: 10/29/2015 CLINICAL DATA:  PICC line placement EXAM: PORTABLE CHEST 1 VIEW COMPARISON:  06/08/2015 FINDINGS: Borderline cardiomegaly. There is small left pleural effusion with left basilar atelectasis or infiltrate. Right arm PICC line with tip in SVC right atrium. No pneumothorax. No pulmonary edema. Degenerative changes bilateral shoulders. IMPRESSION: Right arm PICC line in place. No pneumothorax. Small left pleural effusion with left basilar atelectasis or infiltrate. No pulmonary edema. Electronically Signed   By: Natasha Mead M.D.   On: 10/29/2015 17:04   Dg Foot 2 Views Right  Result Date: 10/27/2015 CLINICAL DATA:  Ulcer of the left ankle. Chronic ulceration between the third and fourth digits. History of rheumatoid arthritis. EXAM: RIGHT FOOT - 2 VIEW COMPARISON:  None. FINDINGS: Diffuse bone demineralization. Extension deformities of the toes.  Probable erosive changes at the metatarsal-phalangeal joints although visualization is limited because of positioning. These changes are likely related to the history of rheumatoid arthritis although focal osteomyelitis is not entirely excluded. Degenerative changes in the intertarsal joints. No radiopaque soft tissue foreign bodies. No soft tissue gas collections. Vascular calcifications. No acute fractures identified. IMPRESSION: Extension deformities of the toes with probable erosive changes at the metatarsal-phalangeal joints of all toes. Changes are likely due to remote arthritis but osteomyelitis is not entirely excluded. Diffuse bone station. Electronically Signed   By: Burman Nieves M.D.   On: 10/27/2015 22:45     CBC  Recent Labs Lab 10/27/15 1646 10/28/15 0412 10/31/15 0416  WBC 7.4 6.5 6.0  HGB 10.8* 9.4* 9.4*  HCT 31.6* 27.1* 28.6*  PLT 288 229 215  MCV 87.0 86.6 87.2  MCH 29.6 29.9 28.7  MCHC 34.1 34.6 32.9  RDW 14.8* 14.5 14.6*  LYMPHSABS 0.6*  --   --   MONOABS 0.5  --   --   EOSABS 0.1  --   --   BASOSABS 0.1  --   --     Chemistries   Recent Labs Lab 10/27/15 1646 10/28/15 0412 10/29/15 0411 10/30/15 0337 10/31/15 0416  NA 132* 137 137  --  140  K 4.5 4.0 4.1  --  3.4*  CL 101 109 111  --  109  CO2 24 22 23   --  25  GLUCOSE 122* 83 85  --  93  BUN 28* 26* 20  --  13  CREATININE 1.40* 1.26* 1.57* 1.34* 1.25*  CALCIUM 9.2 9.1 9.0  --  8.9  AST 19  --   --   --   --   ALT 12*  --   --   --   --   ALKPHOS 93  --   --   --   --   BILITOT 0.2*  --   --   --   --    ------------------------------------------------------------------------------------------------------------------ estimated creatinine clearance is 32.3 mL/min (by C-G formula based on SCr of 1.25 mg/dL (H)). ------------------------------------------------------------------------------------------------------------------ No results for input(s): HGBA1C in the last 72  hours. ------------------------------------------------------------------------------------------------------------------ No results for input(s): CHOL, HDL, LDLCALC, TRIG, CHOLHDL, LDLDIRECT in the last 72 hours. ------------------------------------------------------------------------------------------------------------------ No results for input(s): TSH, T4TOTAL, T3FREE, THYROIDAB in the last 72 hours.  Invalid input(s): FREET3 ------------------------------------------------------------------------------------------------------------------ No results for input(s): VITAMINB12, FOLATE, FERRITIN, TIBC, IRON, RETICCTPCT in the last 72 hours.  Coagulation profile No results for input(s): INR, PROTIME in the last 168 hours.  No results for input(s): DDIMER in the last 72 hours.  Cardiac Enzymes No results for input(s): CKMB, TROPONINI, MYOGLOBIN in  the last 168 hours.  Invalid input(s): CK ------------------------------------------------------------------------------------------------------------------ Invalid input(s): POCBNP    Assessment & Plan    This is a 60 y.o. female with a history of rheumatoid arthritis, psoriatic arthritis, vascular dementia, alcohol abuse now being admitted with: 1. Acute osteomyelitis of the left lateral ankle Due to Enterobacter and MRSA status post debridement PICC line placed Continue Zosyn and vancomycin superficial cultures are showing Enterobacter aerogenes 2. AK I-continue to monitor currently stable 3. Anemia, chronic, monitor CBC no need for transfusion  4. Hyponatremia, mild-improved with IV hydration 5. History of TIA/CVA-continue aspirin  6. History of vascular dementia-continue Aricept, folic acid 7. History of hypertension-continue metoprolol Dyspareunia likely will a skilled nursing facility I will ask social worker to see     Code Status Orders        Start     Ordered   10/27/15 2114  Full code  Continuous     10/27/15 2113     Code Status History    Date Active Date Inactive Code Status Order ID Comments User Context   06/08/2015  2:45 PM 06/09/2015  4:36 PM Full Code [id number]  Shaune Pollack, MD ED           Consults  Podiatry   DVT Prophylaxis  Lovenox  Lab Results  Component Value Date   PLT 215 10/31/2015     Time Spent in minutes   25 minutes Greater than 50% of time spent in care coordination and counseling patient regarding the condition and plan of care.   Auburn Bilberry M.D on 11/01/2015 at 2:19 PM  Between 7am to 6pm - Pager - [phone]  After 6pm go to [email or web address] - password [email or web address] [facility]  [facility]   Office  [phone]

## 2015-11-01 NOTE — NC FL2 (Signed)
[place] MEDICAID FL2 LEVEL OF CARE SCREENING TOOL     IDENTIFICATION  Patient Name: Ruth Price Birthdate: 21-Aug-1955 Sex: female Admission Date (Current Location): 10/27/2015  [place] and [place] Number:  Chiropodist and Address:  [facility], [address], [place], [place] [address]      Provider Number: [id number]  Attending Physician Name and Address:  Auburn Bilberry, MD  Relative Name and Phone Number:       Current Level of Care: Hospital Recommended Level of Care: Skilled Nursing Facility Prior Approval Number:    Date Approved/Denied:   PASRR Number:  [id number]  Discharge Plan: SNF    Current Diagnoses: Patient Active Problem List   Diagnosis Date Noted  . Protein-calorie malnutrition, severe 10/30/2015  . Alzheimer's dementia 10/27/2015  . Alcohol abuse 10/27/2015  . Osteomyelitis of ankle (HCC) 10/27/2015  . CVA (cerebral infarction) 06/08/2015    Orientation RESPIRATION BLADDER Height & Weight     Self, Time, Situation, Place  Normal Continent Weight: 96 lb 9.6 oz (43.8 kg) Height:  5\' 5"  (165.1 cm)  BEHAVIORAL SYMPTOMS/MOOD NEUROLOGICAL BOWEL NUTRITION STATUS   (None. )  (None. ) Continent Diet (Diet: Regular )  AMBULATORY STATUS COMMUNICATION OF NEEDS Skin   Extensive Assist Verbally Surgical wounds, Wound Vac (Incision: Left Ankle )                       Personal Care Assistance Level of Assistance  Bathing, Feeding, Dressing Bathing Assistance: Limited assistance Feeding assistance: Independent Dressing Assistance: Limited assistance     Functional Limitations Info  Sight, Hearing, Speech Sight Info: Adequate Hearing Info: Adequate Speech Info: Adequate    SPECIAL CARE FACTORS FREQUENCY  PT (By licensed PT), OT (By licensed OT)     PT Frequency:  (5) OT Frequency:  (5)            Contractures      Additional Factors Info  Code Status, Allergies, Isolation Precautions Code  Status Info:  (Full Code. ) Allergies Info:  (Levofloxacin )     Isolation Precautions Info:  (Contact )     Current Medications (11/01/2015):  This is the current hospital active medication list Current Facility-Administered Medications  Medication Dose Route Frequency Provider Last Rate Last Dose  . 0.9 %  sodium chloride infusion   Intravenous Continuous Alexis Hugelmeyer, DO 75 mL/hr at 10/31/15 1851 1,000 mL at 10/31/15 1851  . acetaminophen (TYLENOL) tablet 650 mg  650 mg Oral Q6H PRN Alexis Hugelmeyer, DO   650 mg at 10/27/15 2301   Or  . acetaminophen (TYLENOL) suppository 650 mg  650 mg Rectal Q6H PRN Alexis Hugelmeyer, DO      . ALPRAZolam (XANAX XR) 24 hr tablet 0.5 mg  0.5 mg Oral Daily 2302, MD   0.5 mg at 11/01/15 1055  . aspirin chewable tablet 81 mg  81 mg Oral Daily Alexis Hugelmeyer, DO   81 mg at 11/01/15 1056  . atorvastatin (LIPITOR) tablet 40 mg  40 mg Oral QHS Alexis Hugelmeyer, DO   40 mg at 10/31/15 2137  . bisacodyl (DULCOLAX) EC tablet 5 mg  5 mg Oral Daily PRN Alexis Hugelmeyer, DO      . calcium carbonate (TUMS - dosed in mg elemental calcium) chewable tablet 600 mg of elemental calcium  600 mg of elemental calcium Oral Q breakfast Alexis Hugelmeyer, DO   600 mg of elemental calcium at 11/01/15 0837  . Chlorhexidine  Gluconate Cloth 2 % PADS 6 each  6 each Topical Q0600 Gwyneth Revels, DPM   6 each at 11/01/15 0640  . donepezil (ARICEPT) tablet 10 mg  10 mg Oral QHS Alexis Hugelmeyer, DO   10 mg at 10/31/15 2138  . enoxaparin (LOVENOX) injection 30 mg  30 mg Subcutaneous QHS Alexis Hugelmeyer, DO   30 mg at 10/31/15 2138  . folic acid (FOLVITE) tablet 1 mg  1 mg Oral Daily Alexis Hugelmeyer, DO   1 mg at 11/01/15 1055  . lisinopril (PRINIVIL,ZESTRIL) tablet 10 mg  10 mg Oral Daily Auburn Bilberry, MD   10 mg at 11/01/15 1056  . magnesium citrate solution 1 Bottle  1 Bottle Oral Once PRN Alexis Hugelmeyer, DO      . metoprolol tartrate (LOPRESSOR) tablet 25  mg  25 mg Oral BID Auburn Bilberry, MD   25 mg at 11/01/15 1055  . morphine 2 MG/ML injection 1 mg  1 mg Intravenous Q1H PRN Gwyneth Revels, DPM   1 mg at 11/01/15 1304  . multivitamin with minerals tablet 1 tablet  1 tablet Oral Daily Alexis Hugelmeyer, DO   1 tablet at 11/01/15 1056  . mupirocin ointment (BACTROBAN) 2 % 1 application  1 application Nasal BID Gwyneth Revels, DPM   1 application at 11/01/15 0021  . omega-3 acid ethyl esters (LOVAZA) capsule 2 g  2 g Oral Daily Alexis Hugelmeyer, DO   2 g at 11/01/15 1056  . ondansetron (ZOFRAN) tablet 4 mg  4 mg Oral Q6H PRN Alexis Hugelmeyer, DO       Or  . ondansetron (ZOFRAN) injection 4 mg  4 mg Intravenous Q6H PRN Alexis Hugelmeyer, DO   4 mg at 10/31/15 1716  . oxyCODONE-acetaminophen (PERCOCET/ROXICET) 5-325 MG per tablet 1-2 tablet  1-2 tablet Oral Q4H PRN Gwyneth Revels, DPM   1 tablet at 11/01/15 1055  . piperacillin-tazobactam (ZOSYN) IVPB 3.375 g  3.375 g Intravenous Q8H Alexis Hugelmeyer, DO   3.375 g at 11/01/15 1056  . senna-docusate (Senokot-S) tablet 1 tablet  1 tablet Oral QHS PRN Alexis Hugelmeyer, DO      . sodium chloride (OCEAN) 0.65 % nasal spray 1 spray  1 spray Each Nare PRN Auburn Bilberry, MD   1 spray at 10/30/15 2201  . vancomycin (VANCOCIN) 500 mg in sodium chloride 0.9 % 100 mL IVPB  500 mg Intravenous Q24H Olene Floss, RPH   500 mg at 10/31/15 2137  . zolpidem (AMBIEN) tablet 5 mg  5 mg Oral QHS PRN Tonye Royalty, DO         Discharge Medications: Please see discharge summary for a list of discharge medications.  Relevant Imaging Results:  Relevant Lab Results:   Additional Information  (SSN: [id number]. Wound vac antibiotics. )  Vancomycin               500       mg  every      24         hours .     Goal vancomycin trough 15-20.    Pharmacy to adjust dosing based on levels Zosyn  3.375               grams every    8          hours   Ralene Bathe, Student-Social Work

## 2015-11-01 NOTE — Consult Note (Signed)
   [facility] CM Inpatient Consult   11/01/2015  Liyah Higham 15-Jan-1956 [id number]   Patient screened for potential [facility] Care Management services. Patient is eligible for [facility] Services. Spoke with initial inpatient case manager who, stated patient's discharge plan is most likely SNF with possibility of potential long term placement although plans have not been finalized to this yet. This hospital liaison will be out of the office for the next week so if there is a   change in this potential plan, please place a [facility] Care Management consult. For questions please contact:   Dayton Sherr RN, BSN [facility] Liaison  [phone]) Business Mobile [phone]) Toll free office

## 2015-11-01 NOTE — Progress Notes (Signed)
Infectious Disease Long Term IV Antibiotic Orders  Diagnosis: L lateral ankle osteomyeltiis   Culture results Results for orders placed or performed during the hospital encounter of 10/27/15  CULTURE, BLOOD (ROUTINE X 2) w Reflex to ID Panel     Status: None   Collection Time: 10/27/15  4:46 PM  Result Value Ref Range Status   Specimen Description BLOOD RIGHT ANTECUBITAL  Final   Special Requests BOTTLES DRAWN AEROBIC AND ANAEROBIC  10CC  Final   Culture NO GROWTH 5 DAYS  Final   Report Status 11/01/2015 FINAL  Final  CULTURE, BLOOD (ROUTINE X 2) w Reflex to ID Panel     Status: None   Collection Time: 10/27/15  4:47 PM  Result Value Ref Range Status   Specimen Description BLOOD RIGHT WRIST  Final   Special Requests BOTTLES DRAWN AEROBIC AND ANAEROBIC  10CC  Final   Culture NO GROWTH 5 DAYS  Final   Report Status 11/01/2015 FINAL  Final  Aerobic/Anaerobic Culture (surgical/deep wound)     Status: None (Preliminary result)   Collection Time: 10/28/15 10:24 AM  Result Value Ref Range Status   Specimen Description ANKLE  Final   Special Requests NONE  Final   Gram Stain   Final    RARE WBC PRESENT, PREDOMINANTLY PMN RARE GRAM POSITIVE COCCI IN PAIRS Performed at [facility]    Culture   Final    FEW ENTEROBACTER AEROGENES MODERATE METHICILLIN RESISTANT STAPHYLOCOCCUS AUREUS NO ANAEROBES ISOLATED; CULTURE IN PROGRESS FOR 5 DAYS    Report Status PENDING  Incomplete   Organism ID, Bacteria ENTEROBACTER AEROGENES  Final   Organism ID, Bacteria METHICILLIN RESISTANT STAPHYLOCOCCUS AUREUS  Final      Susceptibility   Enterobacter aerogenes - MIC*    CEFAZOLIN >=64 RESISTANT Resistant     CEFEPIME <=1 SENSITIVE Sensitive     CEFTAZIDIME <=1 SENSITIVE Sensitive     CEFTRIAXONE <=1 SENSITIVE Sensitive     CIPROFLOXACIN <=0.25 SENSITIVE Sensitive     GENTAMICIN <=1 SENSITIVE Sensitive     IMIPENEM 1 SENSITIVE Sensitive     TRIMETH/SULFA <=20 SENSITIVE Sensitive     PIP/TAZO  <=4 SENSITIVE Sensitive     * FEW ENTEROBACTER AEROGENES   Methicillin resistant staphylococcus aureus - MIC*    CIPROFLOXACIN >=8 RESISTANT Resistant     ERYTHROMYCIN >=8 RESISTANT Resistant     GENTAMICIN <=0.5 SENSITIVE Sensitive     OXACILLIN >=4 RESISTANT Resistant     TETRACYCLINE <=1 SENSITIVE Sensitive     VANCOMYCIN 1 SENSITIVE Sensitive     TRIMETH/SULFA <=10 SENSITIVE Sensitive     CLINDAMYCIN >=8 RESISTANT Resistant     RIFAMPIN <=0.5 SENSITIVE Sensitive     Inducible Clindamycin NEGATIVE Sensitive     * MODERATE METHICILLIN RESISTANT STAPHYLOCOCCUS AUREUS  MRSA PCR Screening     Status: Abnormal   Collection Time: 10/31/15  4:22 AM  Result Value Ref Range Status   MRSA by PCR POSITIVE (A) NEGATIVE Final    Comment:        The GeneXpert MRSA Assay (FDA approved for NASAL specimens only), is one component of a comprehensive MRSA colonization surveillance program. It is not intended to diagnose MRSA infection nor to guide or monitor treatment for MRSA infections. CRITICAL RESULT CALLED TO, READ BACK BY AND VERIFIED WITH: DRUSELLA JACKSON@0700  ON 10/31/15 BY HKP   Aerobic/Anaerobic Culture (surgical/deep wound)     Status: None (Preliminary result)   Collection Time: 10/31/15  5:30 PM  Result  Value Ref Range Status   Specimen Description WOUND  Final   Special Requests LEFT ANKLE  Final   Gram Stain   Final    FEW WBC PRESENT,BOTH PMN AND MONONUCLEAR NO ORGANISMS SEEN    Culture   Final    NO GROWTH < 24 HOURS Performed at [facility]    Report Status PENDING  Incomplete     Allergies:  Allergies  Allergen Reactions  . Levofloxacin Other (See Comments)    Reaction:  Unknown     Labs  Lab Results  Component Value Date   ESRSEDRATE 51 (H) 10/31/2015   Lab Results  Component Value Date   CRP 5.1 (H) 10/31/2015   Lab Results  Component Value Date   CREATININE 1.25 (H) 10/31/2015   Lab Results  Component Value Date   HGB 9.4 (L)  10/31/2015    Discharge antibiotics Vancomycin               500   mg  every      24         hours .     Goal vancomycin trough 15-20.    Pharmacy to adjust dosing based on levels Zosyn 3.375  grams every 8 hours  PICC Care per protocol Labs weekly while on IV antibiotics      CBC w diff   Comprehensive met panel Vancomycin Trough   CRP  ESR  Planned duration of antibiotics 6 weeks from 9/27  Stop date Nov 8th  Follow up clinic date Within 4 weeks    FAX weekly labs to [phone]  Leonel Ramsay, MD

## 2015-11-01 NOTE — Progress Notes (Signed)
Daily Progress Note   Subjective  - 1 Day Post-Op  F/u left ankle fibula excision.    Objective Vitals:   10/31/15 2215 11/01/15 0008 11/01/15 0438 11/01/15 0828  BP: 140/68 (!) 161/75 (!) 155/81 (!) 162/62  Pulse: 60 (!) 59 (!) 130 (!) 58  Resp: 16 18 18 16   Temp: 98.2 F (36.8 C) 97.6 F (36.4 C) 98 F (36.7 C) 97.8 F (36.6 C)  TempSrc: Axillary Oral  Oral  SpO2:  95% 95% 99%  Weight:      Height:        Physical Exam: Wound vac clotted. Changed and worked well.  No purulence  Laboratory CBC    Component Value Date/Time   WBC 6.0 10/31/2015 0416   HGB 9.4 (L) 10/31/2015 0416   HCT 28.6 (L) 10/31/2015 0416   PLT 215 10/31/2015 0416    BMET    Component Value Date/Time   NA 140 10/31/2015 0416   K 3.4 (L) 10/31/2015 0416   CL 109 10/31/2015 0416   CO2 25 10/31/2015 0416   GLUCOSE 93 10/31/2015 0416   BUN 13 10/31/2015 0416   CREATININE 1.25 (H) 10/31/2015 0416   CALCIUM 8.9 10/31/2015 0416   GFRNONAA 45 (L) 10/31/2015 0416   GFRAA 52 (L) 10/31/2015 0416    Assessment/Planning: Left ankle osteomyelitis   C/W wound vac.   Will need long term antibiotics  Suspect can be d/c'd tomorrow when stable medically.  Will see tomorrow.  Will need NWB left lower leg.    11/02/2015 A  11/01/2015, 12:56 PM

## 2015-11-01 NOTE — Progress Notes (Signed)
Shift assessment completed at approx 0830. Pt is alert and oriented, hard of hearing bilaterally and stated that her son will bring in new batteries for her hearing aids. Pt is on room air, lungs are clear bilat. Murmur is auscultated, abdomen is soft, bs heard. Pt is wearing incontinence brief,Rppp. Lpp weak, wound vac intact to l ankle, heel protective boot intact bilat. Pt stated her pain level was low when asked at assessment. Pt's bilat hands are disfigured, unable to move her fingers to grasp, etc. Picc line intact to rua, PIV #20 intact to R fa, both sites are free of redness and swelling. Since that time, pt has received pain medication with am meds. Wound vac dressing to l ankle has opened slightly and pt had bled into the boot and onto the bed. Dressing was fixed, then Dr. Ether Griffins replaced the entire wound vac dressing at 1300 on rounds. Pt received morphine ivp after this for pain to the site. Pt has ivf ns infusing at 67mls/hr as well. Pt has call button in reach.

## 2015-11-02 LAB — CBC
HEMATOCRIT: 26.9 % — AB (ref 35.0–47.0)
HEMOGLOBIN: 9 g/dL — AB (ref 12.0–16.0)
MCH: 29.6 pg (ref 26.0–34.0)
MCHC: 33.6 g/dL (ref 32.0–36.0)
MCV: 88 fL (ref 80.0–100.0)
Platelets: 224 10*3/uL (ref 150–440)
RBC: 3.05 MIL/uL — ABNORMAL LOW (ref 3.80–5.20)
RDW: 15 % — AB (ref 11.5–14.5)
WBC: 6.3 10*3/uL (ref 3.6–11.0)

## 2015-11-02 LAB — BASIC METABOLIC PANEL
ANION GAP: 5 (ref 5–15)
BUN: 13 mg/dL (ref 6–20)
CALCIUM: 8.7 mg/dL — AB (ref 8.9–10.3)
CO2: 26 mmol/L (ref 22–32)
Chloride: 107 mmol/L (ref 101–111)
Creatinine, Ser: 1.23 mg/dL — ABNORMAL HIGH (ref 0.44–1.00)
GFR calc non Af Amer: 46 mL/min — ABNORMAL LOW (ref >60)
GFR, EST AFRICAN AMERICAN: 53 mL/min — AB (ref >60)
Glucose, Bld: 84 mg/dL (ref 65–99)
Potassium: 3.6 mmol/L (ref 3.5–5.1)
Sodium: 138 mmol/L (ref 135–145)

## 2015-11-02 MED ORDER — SENNOSIDES-DOCUSATE SODIUM 8.6-50 MG PO TABS: 1.0000 | ORAL_TABLET | Freq: Every evening | ORAL | 1 refills | Status: DC | PRN

## 2015-11-02 MED ORDER — VANCOMYCIN HCL 500 MG IV SOLR: 500.0000 mg | INTRAVENOUS | 0 refills | Status: AC

## 2015-11-02 MED ORDER — OXYCODONE-ACETAMINOPHEN 5-325 MG PO TABS: 1.0000 | ORAL_TABLET | ORAL | 0 refills | Status: DC | PRN

## 2015-11-02 MED ORDER — LISINOPRIL 10 MG PO TABS: 10.0000 mg | ORAL_TABLET | Freq: Every day | ORAL | 0 refills | Status: DC

## 2015-11-02 MED ORDER — PIPERACILLIN-TAZOBACTAM 3.375 G IVPB: 3.3750 g | Freq: Three times a day (TID) | INTRAVENOUS | 1 refills | Status: AC

## 2015-11-02 MED ORDER — OXYCODONE-ACETAMINOPHEN 5-325 MG PO TABS: 1.0000 | ORAL_TABLET | ORAL | Status: DC | PRN

## 2015-11-02 NOTE — NC FL2 (Signed)
[place] MEDICAID FL2 LEVEL OF CARE SCREENING TOOL     IDENTIFICATION  Patient Name: Ruth Price Birthdate: 09-10-1955 Sex: female Admission Date (Current Location): 10/27/2015  [place] and [place] Number:  Chiropodist and Address:  [facility], [address], [place], [place] [address]      Provider Number: [id number]  Attending Physician Name and Address:  Enedina Finner, MD  Relative Name and Phone Number:       Current Level of Care: Hospital Recommended Level of Care: Skilled Nursing Facility Prior Approval Number:    Date Approved/Denied:   PASRR Number:   [id number]  Discharge Plan: SNF    Current Diagnoses: Patient Active Problem List   Diagnosis Date Noted  . Protein-calorie malnutrition, severe 10/30/2015  . Alzheimer's dementia 10/27/2015  . Alcohol abuse 10/27/2015  . Osteomyelitis of ankle (HCC) 10/27/2015  . CVA (cerebral infarction) 06/08/2015    Orientation RESPIRATION BLADDER Height & Weight     Self, Time, Situation, Place  Normal Continent Weight: 96 lb 9.6 oz (43.8 kg) Height:  5\' 5"  (165.1 cm)  BEHAVIORAL SYMPTOMS/MOOD NEUROLOGICAL BOWEL NUTRITION STATUS   (None. )  (None. ) Continent Diet (Diet: Regular )  AMBULATORY STATUS COMMUNICATION OF NEEDS Skin   Extensive Assist Verbally Surgical wounds, Wound Vac (Incision: Left Ankle )                       Personal Care Assistance Level of Assistance  Bathing, Feeding, Dressing Bathing Assistance: Limited assistance Feeding assistance: Independent Dressing Assistance: Limited assistance     Functional Limitations Info  Sight, Hearing, Speech Sight Info: Adequate Hearing Info: Adequate Speech Info: Adequate    SPECIAL CARE FACTORS FREQUENCY  PT (By licensed PT), OT (By licensed OT)     PT Frequency:  (5) OT Frequency:  (5)            Contractures      Additional Factors Info  Code Status, Allergies, Isolation Precautions Code Status  Info:  (Full Code. ) Allergies Info:  (Levofloxacin )     Isolation Precautions Info:  (Contact )     Current Medications (11/02/2015):  This is the current hospital active medication list Current Facility-Administered Medications  Medication Dose Route Frequency Provider Last Rate Last Dose  . 0.9 %  sodium chloride infusion   Intravenous Continuous Alexis Hugelmeyer, DO 75 mL/hr at 11/02/15 0052 75 mL/hr at 11/02/15 0052  . acetaminophen (TYLENOL) tablet 650 mg  650 mg Oral Q6H PRN Alexis Hugelmeyer, DO   650 mg at 10/27/15 2301   Or  . acetaminophen (TYLENOL) suppository 650 mg  650 mg Rectal Q6H PRN Alexis Hugelmeyer, DO      . ALPRAZolam (XANAX XR) 24 hr tablet 0.5 mg  0.5 mg Oral Daily 2302, MD   0.5 mg at 11/02/15 0956  . aspirin chewable tablet 81 mg  81 mg Oral Daily Alexis Hugelmeyer, DO   81 mg at 11/02/15 0957  . atorvastatin (LIPITOR) tablet 40 mg  40 mg Oral QHS Alexis Hugelmeyer, DO   40 mg at 11/01/15 2136  . bisacodyl (DULCOLAX) EC tablet 5 mg  5 mg Oral Daily PRN Alexis Hugelmeyer, DO      . calcium carbonate (TUMS - dosed in mg elemental calcium) chewable tablet 600 mg of elemental calcium  600 mg of elemental calcium Oral Q breakfast Alexis Hugelmeyer, DO   600 mg of elemental calcium at 11/01/15 0837  .  Chlorhexidine Gluconate Cloth 2 % PADS 6 each  6 each Topical Q0600 Gwyneth Revels, DPM   6 each at 11/02/15 0529  . donepezil (ARICEPT) tablet 10 mg  10 mg Oral QHS Alexis Hugelmeyer, DO   10 mg at 11/01/15 2136  . enoxaparin (LOVENOX) injection 30 mg  30 mg Subcutaneous QHS Alexis Hugelmeyer, DO   30 mg at 11/01/15 2138  . folic acid (FOLVITE) tablet 1 mg  1 mg Oral Daily Alexis Hugelmeyer, DO   1 mg at 11/02/15 0958  . lisinopril (PRINIVIL,ZESTRIL) tablet 10 mg  10 mg Oral Daily Auburn Bilberry, MD   10 mg at 11/02/15 0957  . magnesium citrate solution 1 Bottle  1 Bottle Oral Once PRN Alexis Hugelmeyer, DO      . metoprolol tartrate (LOPRESSOR) tablet 25 mg  25  mg Oral BID Auburn Bilberry, MD   25 mg at 11/02/15 0958  . morphine 2 MG/ML injection 1 mg  1 mg Intravenous Q1H PRN Gwyneth Revels, DPM   1 mg at 11/01/15 1304  . multivitamin with minerals tablet 1 tablet  1 tablet Oral Daily Alexis Hugelmeyer, DO   1 tablet at 11/02/15 0957  . mupirocin ointment (BACTROBAN) 2 % 1 application  1 application Nasal BID Gwyneth Revels, DPM   1 application at 11/02/15 0959  . omega-3 acid ethyl esters (LOVAZA) capsule 2 g  2 g Oral Daily Alexis Hugelmeyer, DO   2 g at 11/02/15 0956  . ondansetron (ZOFRAN) tablet 4 mg  4 mg Oral Q6H PRN Alexis Hugelmeyer, DO       Or  . ondansetron (ZOFRAN) injection 4 mg  4 mg Intravenous Q6H PRN Alexis Hugelmeyer, DO   4 mg at 10/31/15 1716  . oxyCODONE-acetaminophen (PERCOCET/ROXICET) 5-325 MG per tablet 1-2 tablet  1-2 tablet Oral Q4H PRN Gwyneth Revels, DPM   2 tablet at 11/01/15 2216  . piperacillin-tazobactam (ZOSYN) IVPB 3.375 g  3.375 g Intravenous Q8H Alexis Hugelmeyer, DO   3.375 g at 11/02/15 0959  . senna-docusate (Senokot-S) tablet 1 tablet  1 tablet Oral QHS PRN Alexis Hugelmeyer, DO      . sodium chloride (OCEAN) 0.65 % nasal spray 1 spray  1 spray Each Nare PRN Auburn Bilberry, MD   1 spray at 10/30/15 2201  . vancomycin (VANCOCIN) 500 mg in sodium chloride 0.9 % 100 mL IVPB  500 mg Intravenous Q24H Olene Floss, RPH   500 mg at 11/01/15 2135  . zolpidem (AMBIEN) tablet 5 mg  5 mg Oral QHS PRN Alexis Hugelmeyer, DO   5 mg at 11/01/15 2217     Discharge Medications: Please see discharge summary for a list of discharge medications.  Relevant Imaging Results:  Relevant Lab Results:   Additional Information  (SSN: [id number]. Wound vac antibiotics. )  Sample, Darleen Crocker, LCSW

## 2015-11-02 NOTE — Progress Notes (Addendum)
Clinical Child psychotherapist (CSW) presented bed offers to patient she chose [facility]. Patient is medically stable for D/C today. Per [place] admissions coordinator at [facility] patient can go to room 32-A. Per [place] the wound vac will not be delivered to [facility] until Monday. Ortho floor director is agreeable for patient to discharge with hospital wound vac on, which will be picked up from [facility] on Monday. RN will call report and arrange EMS for transport. CSW sent D/C orders to [place] via [facility]. [facility] authorization has been received , auth # [id number]. Patient is aware of above. CSW contacted patient's son Jomarie Longs and made him aware of above. Please reconsult if future social work needs arise. CSW signing off.   [employer], LCSW [phone]

## 2015-11-02 NOTE — Progress Notes (Signed)
Pt doing well. Wound vac intact. Not complaining of pain.  Remain NWB 3x's per week wound vac dressing changes F/u with me in 2 weeks.IV abx per ID. OK from podiatry standpoint to be d/c'd.

## 2015-11-02 NOTE — Progress Notes (Signed)
Pt left at this for SNF via EMS. VS stable, pt sent with woundvac with the understanding that this will be replaced on 10/2 and returned here. Picc line patent and intact at discharge.

## 2015-11-02 NOTE — Progress Notes (Signed)
Assessment completed by 0830. Pt denied pain at that time, continues to deny pain. Physical therapy has evaluated pt, pt to be dc'd to snf later today. Pt is alert and oriented, in no distress, on room air, s1s2 auscultated. Abdomen is soft, bs heard. Pt is wearing incontinence brief, voiding by bedpan.ppp. woundvac is intact to L ankle, and continuous. Pt has refused to eat, stating that we are unable to position her in the bed in a way that works for her to eat. Pt 's hands are significantly deformed from arthritis, this Clinical research associate offered adaptive silverware and pt refused.

## 2015-11-02 NOTE — Evaluation (Signed)
Physical Therapy Evaluation Patient Details Name: Ruth Price MRN: [id number] DOB: 02/09/1955 Today's Date: 11/02/2015   History of Present Illness  59 y/o female with long history of severe arthritis.  She is s/p L distal fibula excision and has wound vac in place.  Clinical Impression  Pt is willing to participate with light bed mobility and appeared ready to attempt standing knowing she would need some assist, but once she was sitting EOB and we discussed getting up she decided that she knew she would be unable to maintain NWBing AND get to standing.  The arthritis t/o her body make nearly every joint stiff and she is not able to fully extend elbows, grasp walker solidly and it does appear to appropriate use of a walker to maintain NWBing is going to be quite difficult.  Pt did relatively well with bed mobility, though UE limitations made even scooting to EOB something she needed b/l PT assist with.      Follow Up Recommendations SNF    Equipment Recommendations   (per rehab, may have a hard time using RW 2/2 arthritis)    Recommendations for Other Services       Precautions / Restrictions Precautions Precautions: Fall Restrictions Weight Bearing Restrictions: Yes LLE Weight Bearing: Non weight bearing      Mobility  Bed Mobility Overal bed mobility: Needs Assistance Bed Mobility: Supine to Sit;Sit to Supine     Supine to sit: Min assist Sit to supine: Min assist   General bed mobility comments: Pt needs b/l UE assist to pull herself forward to the EOB.  Transfers                 General transfer comment: donned R shoe, gave verbal and sequencing cues, pt initially felt willing to try but knowing that she is NWBing on the L and reports that L is her strong LE and given her chronic UE weakness/pain/arthritis she would struggle to use the walker appropriately and ultimately deferred standing despite PT offering heavy assist and close cuing.  Ambulation/Gait                 Stairs            Wheelchair Mobility    Modified Rankin (Stroke Patients Only)       Balance                                             Pertinent Vitals/Pain Pain Assessment:  (only minimal pain at rest, chronic low level pain t/o)    Home Living Family/patient expects to be discharged to:: Skilled nursing facility Living Arrangements: Children                    Prior Function Level of Independence: Needs assistance      ADL's / Homemaking Assistance Needed: daughter apparently helps with bathing, etc  Comments: Pt is able to get out of the house occasionally and does stay active with writing, etc     Hand Dominance        Extremity/Trunk Assessment   Upper Extremity Assessment:  (extremely limited ROM in all joints secondary to arthritis)           Lower Extremity Assessment:  (arthritis very limiting in b/l LEs, R LE grossly 3+/5)         Communication   Communication:  No difficulties  Cognition Arousal/Alertness: Awake/alert Behavior During Therapy: Restless Overall Cognitive Status: Within Functional Limits for tasks assessed                      General Comments      Exercises     Assessment/Plan    PT Assessment Patient needs continued PT services  PT Problem List Decreased strength;Decreased range of motion;Decreased activity tolerance;Decreased balance;Decreased mobility;Decreased coordination;Decreased knowledge of use of DME;Decreased safety awareness;Decreased knowledge of precautions          PT Treatment Interventions DME instruction;Gait training;Functional mobility training;Therapeutic activities;Therapeutic exercise;Balance training;Patient/family education    PT Goals (Current goals can be found in the Care Plan section)  Acute Rehab PT Goals Patient Stated Goal: get back to walking PT Goal Formulation: With patient Time For Goal Achievement: 11/16/15 Potential to  Achieve Goals: Fair    Frequency 7X/week   Barriers to discharge        Co-evaluation               End of Session   Activity Tolerance: Patient tolerated treatment well Patient left: with bed alarm set;with call bell/phone within reach Nurse Communication: Mobility status         Time: 1217-1238 PT Time Calculation (min) (ACUTE ONLY): 21 min   Charges:   PT Evaluation $PT Eval Low Complexity: 1 Procedure     PT G Codes:        Malachi Pro, DPT 11/02/2015, 1:28 PM

## 2015-11-02 NOTE — Plan of Care (Signed)
Problem: Skin Integrity: Goal: Risk for impaired skin integrity will decrease Outcome: Adequate for Discharge Pt is to be dc'd with wounvac and picc line to SNF today.

## 2015-11-02 NOTE — Discharge Summary (Addendum)
[facility] [place] [facility]   PATIENT NAME: Ruth Price    MR#:  [id number]  DATE OF BIRTH:  [birth year]  DATE OF ADMISSION:  10/27/2015 ADMITTING PHYSICIAN: Jon Gills Hugelmeyer, DO  DATE OF DISCHARGE: 11/02/15  PRIMARY CARE PHYSICIAN: Apolonio Schneiders C, NP    ADMISSION DIAGNOSIS:  Mild dehydration [E86.0] Acute osteomyelitis of left ankle (HCC) [M86.172]  DISCHARGE DIAGNOSIS:  Acute OM left ankle s/p surgery with wound vac placement HTN RA  SECONDARY DIAGNOSIS:   Past Medical History:  Diagnosis Date  . Dementia   . Hypertension   . RA (rheumatoid arthritis) (HCC)     HOSPITAL COURSE:   60 y.o.femalewith a history of rheumatoid arthritis, psoriatic arthritis, vascular dementia, alcohol abuse now being admitted with: 1. Acute osteomyelitis of the left lateral ankle Due to Enterobacter and MRSA status post debridement PICC line placed Continue Zosyn and vancomycin till nov 8th per ID recomendation superficial cultures are showing Enterobacter aerogenes 2. AK I-continue to monitor currently stable 3. Anemia, chronic, monitor CBC no need for transfusion  4. Hyponatremia, mild-improved with IV hydration 5. History of TIA/CVA-continue aspirin  6. History of vascular dementia-continue Aricept, folic acid 7. History of hypertension-continue metoprolol  Pt is clinically improving. Her vitals are stable She [facility] and for IV abxs F/u podiatry in 2 weeks CONSULTS OBTAINED:  Treatment Team:  Gwyneth Revels, DPM Mick Sell, MD Enedina Finner, MD  DRUG ALLERGIES:   Allergies  Allergen Reactions  . Levofloxacin Other (See Comments)    Reaction:  Unknown     DISCHARGE MEDICATIONS:   Current Discharge Medication List    START taking these medications   Details  lisinopril (PRINIVIL,ZESTRIL) 10 MG tablet Take 1 tablet (10 mg total) by mouth daily. Qty: 30 tablet, Refills: 0    piperacillin-tazobactam  (ZOSYN) 3.375 GM/50ML IVPB Inject 50 mLs (3.375 g total) into the vein every 8 (eight) hours. Qty: 50 mL, Refills: 1    senna-docusate (SENOKOT-S) 8.6-50 MG tablet Take 1 tablet by mouth at bedtime as needed for mild constipation. Qty: 30 tablet, Refills: 1    vancomycin 500 mg in sodium chloride 0.9 % 100 mL Inject 500 mg into the vein daily. Qty: 25 ampule, Refills: 0      CONTINUE these medications which have CHANGED   Details  oxyCODONE-acetaminophen (PERCOCET/ROXICET) 5-325 MG tablet Take 1 tablet by mouth every 4 (four) hours as needed for severe pain. Qty: 30 tablet, Refills: 0      CONTINUE these medications which have NOT CHANGED   Details  B Complex-C (SUPER B COMPLEX PO) Take 1 capsule by mouth daily.    Biotin 1000 MCG tablet Take 1,000 mcg by mouth daily.    cyclobenzaprine (FLEXERIL) 10 MG tablet Take 10 mg by mouth 2 (two) times daily as needed for muscle spasms.    folic acid (FOLVITE) 1 MG tablet Take 1 mg by mouth daily.    Glucosamine-Chondroitin-MSM 750-400-375 MG TABS Take 1 tablet by mouth daily.    Multiple Vitamin (MULTIVITAMIN WITH MINERALS) TABS tablet Take 1 tablet by mouth daily.    naproxen sodium (ANAPROX) 220 MG tablet Take 440 mg by mouth daily with breakfast.    omega-3 acid ethyl esters (LOVAZA) 1 g capsule Take 2 g by mouth daily.    aspirin 81 MG chewable tablet Chew 1 tablet (81 mg total) by mouth daily. Qty: 30 tablet, Refills: 2    atorvastatin (LIPITOR) 40  MG tablet Take 40 mg by mouth at bedtime.    calcium carbonate (OSCAL) 1500 (600 Ca) MG TABS tablet Take 600 mg of elemental calcium by mouth daily with breakfast.    donepezil (ARICEPT) 10 MG tablet Take 10 mg by mouth at bedtime.    metoprolol tartrate (LOPRESSOR) 25 MG tablet Take 1 tablet (25 mg total) by mouth 2 (two) times daily. Qty: 60 tablet, Refills: 0        If you experience worsening of your admission symptoms, develop shortness of breath, life threatening  emergency, suicidal or homicidal thoughts you must seek medical attention immediately by calling 911 or calling your MD immediately  if symptoms less severe.  You Must read complete instructions/literature along with all the possible adverse reactions/side effects for all the Medicines you take and that have been prescribed to you. Take any new Medicines after you have completely understood and accept all the possible adverse reactions/side effects.   Please note  You were cared for by a hospitalist during your hospital stay. If you have any questions about your discharge medications or the care you received while you were in the hospital after you are discharged, you can call the unit and asked to speak with the hospitalist on call if the hospitalist that took care of you is not available. Once you are discharged, your primary care physician will handle any further medical issues. Please note that NO REFILLS for any discharge medications will be authorized once you are discharged, as it is imperative that you return to your primary care physician (or establish a relationship with a primary care physician if you do not have one) for your aftercare needs so that they can reassess your need for medications and monitor your lab values. Today   SUBJECTIVE   No complaints  VITAL SIGNS:  Blood pressure (!) 169/72, pulse 63, temperature 98.4 F (36.9 C), temperature source Oral, resp. rate 16, height 5\' 5"  (1.651 m), weight 43.8 kg (96 lb 9.6 oz), SpO2 96 %.  I/O:    Intake/Output Summary (Last 24 hours) at 11/02/15 1106 Last data filed at 11/02/15 0400  Gross per 24 hour  Intake           1677.5 ml  Output              300 ml  Net           1377.5 ml    PHYSICAL EXAMINATION:  GENERAL:  60 y.o.-year-old patient lying in the bed with no acute distress.  EYES: Pupils equal, round, reactive to light and accommodation. No scleral icterus. Extraocular muscles intact.  HEENT: Head atraumatic,  normocephalic. Oropharynx and nasopharynx clear.  NECK:  Supple, no jugular venous distention. No thyroid enlargement, no tenderness.  LUNGS: Normal breath sounds bilaterally, no wheezing, rales,rhonchi or crepitation. No use of accessory muscles of respiration.  CARDIOVASCULAR: S1, S2 normal. No murmurs, rubs, or gallops.  ABDOMEN: Soft, non-tender, non-distended. Bowel sounds present. No organomegaly or mass.  EXTREMITIES: No pedal edema, cyanosis, or clubbing. Left ankle dressing+ with wound vac NEUROLOGIC: Cranial nerves II through XII are intact. Muscle strength 5/5 in all extremities. Sensation intact. Gait not checked.  PSYCHIATRIC: The patient is alert and oriented x 3.  SKIN: No obvious rash, lesion, or ulcer.   DATA REVIEW:   CBC   Recent Labs Lab 11/02/15 0404  WBC 6.3  HGB 9.0*  HCT 26.9*  PLT 224    Chemistries   Recent Labs Lab  10/27/15 1646  11/02/15 0404  NA 132*  < > 138  K 4.5  < > 3.6  CL 101  < > 107  CO2 24  < > 26  GLUCOSE 122*  < > 84  BUN 28*  < > 13  CREATININE 1.40*  < > 1.23*  CALCIUM 9.2  < > 8.7*  AST 19  --   --   ALT 12*  --   --   ALKPHOS 93  --   --   BILITOT 0.2*  --   --   < > = values in this interval not displayed.  Microbiology Results   Recent Results (from the past 240 hour(s))  CULTURE, BLOOD (ROUTINE X 2) w Reflex to ID Panel     Status: None   Collection Time: 10/27/15  4:46 PM  Result Value Ref Range Status   Specimen Description BLOOD RIGHT ANTECUBITAL  Final   Special Requests BOTTLES DRAWN AEROBIC AND ANAEROBIC  10CC  Final   Culture NO GROWTH 5 DAYS  Final   Report Status 11/01/2015 FINAL  Final  CULTURE, BLOOD (ROUTINE X 2) w Reflex to ID Panel     Status: None   Collection Time: 10/27/15  4:47 PM  Result Value Ref Range Status   Specimen Description BLOOD RIGHT WRIST  Final   Special Requests BOTTLES DRAWN AEROBIC AND ANAEROBIC  10CC  Final   Culture NO GROWTH 5 DAYS  Final   Report Status 11/01/2015 FINAL   Final  Aerobic/Anaerobic Culture (surgical/deep wound)     Status: None (Preliminary result)   Collection Time: 10/28/15 10:24 AM  Result Value Ref Range Status   Specimen Description ANKLE  Final   Special Requests NONE  Final   Gram Stain   Final    RARE WBC PRESENT, PREDOMINANTLY PMN RARE GRAM POSITIVE COCCI IN PAIRS Performed at [facility]    Culture   Final    FEW ENTEROBACTER AEROGENES MODERATE METHICILLIN RESISTANT STAPHYLOCOCCUS AUREUS NO ANAEROBES ISOLATED; CULTURE IN PROGRESS FOR 5 DAYS    Report Status PENDING  Incomplete   Organism ID, Bacteria ENTEROBACTER AEROGENES  Final   Organism ID, Bacteria METHICILLIN RESISTANT STAPHYLOCOCCUS AUREUS  Final      Susceptibility   Enterobacter aerogenes - MIC*    CEFAZOLIN >=64 RESISTANT Resistant     CEFEPIME <=1 SENSITIVE Sensitive     CEFTAZIDIME <=1 SENSITIVE Sensitive     CEFTRIAXONE <=1 SENSITIVE Sensitive     CIPROFLOXACIN <=0.25 SENSITIVE Sensitive     GENTAMICIN <=1 SENSITIVE Sensitive     IMIPENEM 1 SENSITIVE Sensitive     TRIMETH/SULFA <=20 SENSITIVE Sensitive     PIP/TAZO <=4 SENSITIVE Sensitive     * FEW ENTEROBACTER AEROGENES   Methicillin resistant staphylococcus aureus - MIC*    CIPROFLOXACIN >=8 RESISTANT Resistant     ERYTHROMYCIN >=8 RESISTANT Resistant     GENTAMICIN <=0.5 SENSITIVE Sensitive     OXACILLIN >=4 RESISTANT Resistant     TETRACYCLINE <=1 SENSITIVE Sensitive     VANCOMYCIN 1 SENSITIVE Sensitive     TRIMETH/SULFA <=10 SENSITIVE Sensitive     CLINDAMYCIN >=8 RESISTANT Resistant     RIFAMPIN <=0.5 SENSITIVE Sensitive     Inducible Clindamycin NEGATIVE Sensitive     * MODERATE METHICILLIN RESISTANT STAPHYLOCOCCUS AUREUS  MRSA PCR Screening     Status: Abnormal   Collection Time: 10/31/15  4:22 AM  Result Value Ref Range Status   MRSA by PCR POSITIVE (A)  NEGATIVE Final    Comment:        The GeneXpert MRSA Assay (FDA approved for NASAL specimens only), is one component of  a comprehensive MRSA colonization surveillance program. It is not intended to diagnose MRSA infection nor to guide or monitor treatment for MRSA infections. CRITICAL RESULT CALLED TO, READ BACK BY AND VERIFIED WITH: DRUSELLA JACKSON@0700  ON 10/31/15 BY HKP   Aerobic/Anaerobic Culture (surgical/deep wound)     Status: None (Preliminary result)   Collection Time: 10/31/15  5:30 PM  Result Value Ref Range Status   Specimen Description WOUND  Final   Special Requests LEFT ANKLE  Final   Gram Stain   Final    FEW WBC PRESENT,BOTH PMN AND MONONUCLEAR NO ORGANISMS SEEN    Culture   Final    NO GROWTH < 24 HOURS Performed at [facility]    Report Status PENDING  Incomplete    RADIOLOGY:  No results found.   Management plans discussed with the patient, family and they are in agreement.  CODE STATUS:     Code Status Orders        Start     Ordered   10/27/15 2114  Full code  Continuous     10/27/15 2113    Code Status History    Date Active Date Inactive Code Status Order ID Comments User Context   06/08/2015  2:45 PM 06/09/2015  4:36 PM Full Code [id number]  Shaune Pollack, MD ED      TOTAL TIME TAKING CARE OF THIS PATIENT: 40 minutes.    Keene Gilkey M.D on 11/02/2015 at 11:06 AM  Between 7am to 6pm - Pager - [phone] After 6pm go to [email or web address] - password [email or web address] [facility]  [facility]  Office  [phone]  CC: Primary care physician; Jonathon Bellows, NP

## 2015-11-02 NOTE — Clinical Social Work Placement (Signed)
   CLINICAL SOCIAL WORK PLACEMENT  NOTE  Date:  11/02/2015  Patient Details  Name: Ruth Price MRN: [id number] Date of Birth: 03/05/1955  Clinical Social Work is seeking post-discharge placement for this patient at the Skilled  Nursing Facility level of care (*CSW will initial, date and re-position this form in  chart as items are completed):  Yes   Patient/family provided with [place] Clinical Social Work Department's list of facilities offering this level of care within the geographic area requested by the patient (or if unable, by the patient's family).  Yes   Patient/family informed of their freedom to choose among providers that offer the needed level of care, that participate in Medicare, Medicaid or managed care program needed by the patient, have an available bed and are willing to accept the patient.  Yes   Patient/family informed of [facility]'s ownership interest in [facility] and [facility], as well as of the fact that they are under no obligation to receive care at these facilities.  PASRR submitted to EDS on       PASRR number received on       Existing PASRR number confirmed on 11/01/15     FL2 transmitted to all facilities in geographic area requested by pt/family on 11/01/15     FL2 transmitted to all facilities within larger geographic area on       Patient informed that his/her managed care company has contracts with or will negotiate with certain facilities, including the following:        Yes   Patient/family informed of bed offers received.  Patient chooses bed at  [facility] )     Physician recommends and patient chooses bed at      Patient to be transferred to  [facility] ) on 11/02/15.  Patient to be transferred to facility by  [facility] )     Patient family notified on 11/02/15 of transfer.  Name of family member notified:   (Patient's son Jomarie Longs is aware of D/C today. )     PHYSICIAN        Additional Comment:    _______________________________________________ Olvin Rohr, Darleen Crocker, LCSW 11/02/2015, 4:25 PM

## 2015-11-02 NOTE — Discharge Instructions (Signed)
Dressing orders: Wound vac dressing changes 3 x's per week. Note:  Pt has antibiotic beads in wound and should remain in place.  If some are removed with normal dressings that is ok.  Do not replace into wound.  Pharmacy to f/u vanc trough and adjust dosing

## 2015-11-03 LAB — SURGICAL PATHOLOGY

## 2015-11-04 LAB — AEROBIC/ANAEROBIC CULTURE W GRAM STAIN (SURGICAL/DEEP WOUND)

## 2015-11-05 LAB — AEROBIC/ANAEROBIC CULTURE W GRAM STAIN (SURGICAL/DEEP WOUND)

## 2015-11-06 NOTE — Anesthesia Postprocedure Evaluation (Signed)
Anesthesia Post Note  Patient: Ruth Price  Procedure(s) Performed: Procedure(s) (LRB): IRRIGATION AND DEBRIDEMENT FOOT (Left) APPLICATION OF WOUND VAC (Left)  Patient location during evaluation: PACU Anesthesia Type: General Level of consciousness: awake and alert Pain management: pain level controlled Vital Signs Assessment: post-procedure vital signs reviewed and stable Respiratory status: spontaneous breathing, nonlabored ventilation, respiratory function stable and patient connected to nasal cannula oxygen Cardiovascular status: blood pressure returned to baseline and stable Postop Assessment: no signs of nausea or vomiting Anesthetic complications: no    Last Vitals:  Vitals:   11/02/15 0937 11/02/15 1752  BP: (!) 169/72 (!) 147/58  Pulse: 63 (!) 54  Resp:  17  Temp:  37.2 C    Last Pain:  Vitals:   11/02/15 1752  TempSrc: Oral  PainSc:                  Yevette Edwards

## 2016-01-20 ENCOUNTER — Emergency Department: Payer: Medicare HMO

## 2016-01-20 ENCOUNTER — Inpatient Hospital Stay
Admission: EM | Admit: 2016-01-20 | Discharge: 2016-01-25 | DRG: 871 | Disposition: A | Discharge status: Hospice | Payer: Medicare HMO | Attending: Internal Medicine | Admitting: Internal Medicine

## 2016-01-20 ENCOUNTER — Encounter: Payer: Self-pay | Admitting: Emergency Medicine

## 2016-01-20 DIAGNOSIS — Z66 Do not resuscitate: Secondary | ICD-10-CM | POA: Diagnosis not present

## 2016-01-20 DIAGNOSIS — R410 Disorientation, unspecified: Secondary | ICD-10-CM | POA: Diagnosis not present

## 2016-01-20 DIAGNOSIS — R6521 Severe sepsis with septic shock: Secondary | ICD-10-CM | POA: Diagnosis present

## 2016-01-20 DIAGNOSIS — F0150 Vascular dementia without behavioral disturbance: Secondary | ICD-10-CM | POA: Diagnosis present

## 2016-01-20 DIAGNOSIS — E43 Unspecified severe protein-calorie malnutrition: Secondary | ICD-10-CM | POA: Diagnosis present

## 2016-01-20 DIAGNOSIS — F411 Generalized anxiety disorder: Secondary | ICD-10-CM

## 2016-01-20 DIAGNOSIS — I10 Essential (primary) hypertension: Secondary | ICD-10-CM | POA: Diagnosis present

## 2016-01-20 DIAGNOSIS — E872 Acidosis, unspecified: Secondary | ICD-10-CM

## 2016-01-20 DIAGNOSIS — L22 Diaper dermatitis: Secondary | ICD-10-CM | POA: Diagnosis present

## 2016-01-20 DIAGNOSIS — L89159 Pressure ulcer of sacral region, unspecified stage: Secondary | ICD-10-CM | POA: Diagnosis present

## 2016-01-20 DIAGNOSIS — L4050 Arthropathic psoriasis, unspecified: Secondary | ICD-10-CM | POA: Diagnosis present

## 2016-01-20 DIAGNOSIS — Z79899 Other long term (current) drug therapy: Secondary | ICD-10-CM

## 2016-01-20 DIAGNOSIS — R2681 Unsteadiness on feet: Secondary | ICD-10-CM

## 2016-01-20 DIAGNOSIS — R627 Adult failure to thrive: Secondary | ICD-10-CM | POA: Diagnosis present

## 2016-01-20 DIAGNOSIS — H9190 Unspecified hearing loss, unspecified ear: Secondary | ICD-10-CM | POA: Diagnosis present

## 2016-01-20 DIAGNOSIS — N179 Acute kidney failure, unspecified: Secondary | ICD-10-CM

## 2016-01-20 DIAGNOSIS — A414 Sepsis due to anaerobes: Secondary | ICD-10-CM | POA: Diagnosis present

## 2016-01-20 DIAGNOSIS — E875 Hyperkalemia: Secondary | ICD-10-CM | POA: Diagnosis present

## 2016-01-20 DIAGNOSIS — R1311 Dysphagia, oral phase: Secondary | ICD-10-CM

## 2016-01-20 DIAGNOSIS — F1010 Alcohol abuse, uncomplicated: Secondary | ICD-10-CM | POA: Diagnosis present

## 2016-01-20 DIAGNOSIS — N183 Chronic kidney disease, stage 3 (moderate): Secondary | ICD-10-CM | POA: Diagnosis present

## 2016-01-20 DIAGNOSIS — R6520 Severe sepsis without septic shock: Secondary | ICD-10-CM

## 2016-01-20 DIAGNOSIS — R809 Proteinuria, unspecified: Secondary | ICD-10-CM | POA: Diagnosis present

## 2016-01-20 DIAGNOSIS — L8990 Pressure ulcer of unspecified site, unspecified stage: Secondary | ICD-10-CM | POA: Insufficient documentation

## 2016-01-20 DIAGNOSIS — G8929 Other chronic pain: Secondary | ICD-10-CM | POA: Diagnosis present

## 2016-01-20 DIAGNOSIS — Z8249 Family history of ischemic heart disease and other diseases of the circulatory system: Secondary | ICD-10-CM

## 2016-01-20 DIAGNOSIS — G309 Alzheimer's disease, unspecified: Secondary | ICD-10-CM | POA: Diagnosis present

## 2016-01-20 DIAGNOSIS — Z7189 Other specified counseling: Secondary | ICD-10-CM

## 2016-01-20 DIAGNOSIS — L97429 Non-pressure chronic ulcer of left heel and midfoot with unspecified severity: Secondary | ICD-10-CM | POA: Diagnosis present

## 2016-01-20 DIAGNOSIS — I4581 Long QT syndrome: Secondary | ICD-10-CM | POA: Diagnosis present

## 2016-01-20 DIAGNOSIS — I7300 Raynaud's syndrome without gangrene: Secondary | ICD-10-CM | POA: Diagnosis present

## 2016-01-20 DIAGNOSIS — R32 Unspecified urinary incontinence: Secondary | ICD-10-CM | POA: Diagnosis present

## 2016-01-20 DIAGNOSIS — A0472 Enterocolitis due to Clostridium difficile, not specified as recurrent: Secondary | ICD-10-CM | POA: Diagnosis present

## 2016-01-20 DIAGNOSIS — R059 Cough, unspecified: Secondary | ICD-10-CM

## 2016-01-20 DIAGNOSIS — A419 Sepsis, unspecified organism: Secondary | ICD-10-CM | POA: Diagnosis present

## 2016-01-20 DIAGNOSIS — I129 Hypertensive chronic kidney disease with stage 1 through stage 4 chronic kidney disease, or unspecified chronic kidney disease: Secondary | ICD-10-CM | POA: Diagnosis present

## 2016-01-20 DIAGNOSIS — L97309 Non-pressure chronic ulcer of unspecified ankle with unspecified severity: Secondary | ICD-10-CM | POA: Diagnosis present

## 2016-01-20 DIAGNOSIS — F0280 Dementia in other diseases classified elsewhere without behavioral disturbance: Secondary | ICD-10-CM | POA: Diagnosis present

## 2016-01-20 DIAGNOSIS — I517 Cardiomegaly: Secondary | ICD-10-CM | POA: Diagnosis present

## 2016-01-20 DIAGNOSIS — Z515 Encounter for palliative care: Secondary | ICD-10-CM | POA: Diagnosis not present

## 2016-01-20 DIAGNOSIS — N170 Acute kidney failure with tubular necrosis: Secondary | ICD-10-CM | POA: Diagnosis not present

## 2016-01-20 DIAGNOSIS — M6281 Muscle weakness (generalized): Secondary | ICD-10-CM

## 2016-01-20 DIAGNOSIS — Z8673 Personal history of transient ischemic attack (TIA), and cerebral infarction without residual deficits: Secondary | ICD-10-CM

## 2016-01-20 DIAGNOSIS — L97419 Non-pressure chronic ulcer of right heel and midfoot with unspecified severity: Secondary | ICD-10-CM | POA: Diagnosis present

## 2016-01-20 DIAGNOSIS — G9341 Metabolic encephalopathy: Secondary | ICD-10-CM | POA: Diagnosis present

## 2016-01-20 DIAGNOSIS — E785 Hyperlipidemia, unspecified: Secondary | ICD-10-CM | POA: Diagnosis present

## 2016-01-20 DIAGNOSIS — Z881 Allergy status to other antibiotic agents status: Secondary | ICD-10-CM

## 2016-01-20 DIAGNOSIS — M069 Rheumatoid arthritis, unspecified: Secondary | ICD-10-CM | POA: Diagnosis present

## 2016-01-20 DIAGNOSIS — M25551 Pain in right hip: Secondary | ICD-10-CM | POA: Diagnosis present

## 2016-01-20 DIAGNOSIS — R05 Cough: Secondary | ICD-10-CM

## 2016-01-20 DIAGNOSIS — G9340 Encephalopathy, unspecified: Secondary | ICD-10-CM

## 2016-01-20 HISTORY — DX: Rheumatoid arthritis, unspecified: M06.9

## 2016-01-20 HISTORY — DX: Pain in right hip: M25.551

## 2016-01-20 HISTORY — DX: Difficulty in walking, not elsewhere classified: R26.2

## 2016-01-20 HISTORY — DX: Raynaud's syndrome without gangrene: I73.00

## 2016-01-20 HISTORY — DX: Other chronic pain: G89.29

## 2016-01-20 HISTORY — DX: Alzheimer's disease, unspecified: G30.9

## 2016-01-20 HISTORY — DX: Essential (primary) hypertension: I10

## 2016-01-20 HISTORY — DX: Alcohol abuse, uncomplicated: F10.10

## 2016-01-20 HISTORY — DX: Hyperlipidemia, unspecified: E78.5

## 2016-01-20 HISTORY — DX: Muscle wasting and atrophy, not elsewhere classified, unspecified site: M62.50

## 2016-01-20 HISTORY — DX: Dementia in other diseases classified elsewhere, unspecified severity, without behavioral disturbance, psychotic disturbance, mood disturbance, and anxiety: F02.80

## 2016-01-20 HISTORY — DX: Cerebral infarction, unspecified: I63.9

## 2016-01-20 HISTORY — DX: Arthropathic psoriasis, unspecified: L40.50

## 2016-01-20 HISTORY — DX: Osteomyelitis, unspecified: M86.9

## 2016-01-20 HISTORY — DX: Vascular dementia, unspecified severity, without behavioral disturbance, psychotic disturbance, mood disturbance, and anxiety: F01.50

## 2016-01-20 LAB — COMPREHENSIVE METABOLIC PANEL
ALK PHOS: 107 U/L (ref 38–126)
ALT: 14 U/L (ref 14–54)
AST: 17 U/L (ref 15–41)
Albumin: 2.8 g/dL — ABNORMAL LOW (ref 3.5–5.0)
BILIRUBIN TOTAL: 1.6 mg/dL — AB (ref 0.3–1.2)
BUN: 93 mg/dL — AB (ref 6–20)
CALCIUM: 8.7 mg/dL — AB (ref 8.9–10.3)
CO2: <7 mmol/L — ABNORMAL LOW (ref 22–32)
Chloride: 104 mmol/L (ref 101–111)
Creatinine, Ser: 7.66 mg/dL — ABNORMAL HIGH (ref 0.44–1.00)
GFR calc Af Amer: 6 mL/min — ABNORMAL LOW (ref >60)
GFR, EST NON AFRICAN AMERICAN: 5 mL/min — AB (ref >60)
Glucose, Bld: 68 mg/dL (ref 65–99)
POTASSIUM: 6.7 mmol/L — AB (ref 3.5–5.1)
Sodium: 133 mmol/L — ABNORMAL LOW (ref 135–145)
TOTAL PROTEIN: 7.5 g/dL (ref 6.5–8.1)

## 2016-01-20 LAB — CBC WITH DIFFERENTIAL/PLATELET
Basophils Absolute: 0.2 10*3/uL — ABNORMAL HIGH (ref 0–0.1)
Basophils Relative: 1 %
Eosinophils Absolute: 0 10*3/uL (ref 0–0.7)
Eosinophils Relative: 0 %
HEMATOCRIT: 36.2 % (ref 35.0–47.0)
Hemoglobin: 11.3 g/dL — ABNORMAL LOW (ref 12.0–16.0)
LYMPHS PCT: 3 %
Lymphs Abs: 1.1 10*3/uL (ref 1.0–3.6)
MCH: 30.1 pg (ref 26.0–34.0)
MCHC: 31.1 g/dL — AB (ref 32.0–36.0)
MCV: 96.7 fL (ref 80.0–100.0)
MONO ABS: 1.2 10*3/uL — AB (ref 0.2–0.9)
MONOS PCT: 3 %
NEUTROS ABS: 35.1 10*3/uL — AB (ref 1.4–6.5)
Neutrophils Relative %: 93 %
Platelets: 378 10*3/uL (ref 150–440)
RBC: 3.75 MIL/uL — ABNORMAL LOW (ref 3.80–5.20)
RDW: 19.9 % — AB (ref 11.5–14.5)
WBC: 37.7 10*3/uL — ABNORMAL HIGH (ref 3.6–11.0)

## 2016-01-20 LAB — TROPONIN I: Troponin I: 0.07 ng/mL (ref <0.03)

## 2016-01-20 LAB — MAGNESIUM: MAGNESIUM: 1.5 mg/dL — AB (ref 1.7–2.4)

## 2016-01-20 LAB — LACTIC ACID, PLASMA: Lactic Acid, Venous: 0.9 mmol/L (ref 0.5–1.9)

## 2016-01-20 MED ORDER — METOCLOPRAMIDE HCL 5 MG/ML IJ SOLN
10.0000 mg | Freq: Once | INTRAMUSCULAR | Status: AC
  Administered 2016-01-20: 10 mg via INTRAVENOUS

## 2016-01-20 MED ORDER — ALBUTEROL SULFATE (2.5 MG/3ML) 0.083% IN NEBU
5.0000 mg | INHALATION_SOLUTION | Freq: Once | RESPIRATORY_TRACT | Status: AC
  Administered 2016-01-20: 5 mg via RESPIRATORY_TRACT
  Filled 2016-01-20: qty 6

## 2016-01-20 MED ORDER — INSULIN ASPART 100 UNIT/ML ~~LOC~~ SOLN
5.0000 [IU] | Freq: Once | SUBCUTANEOUS | Status: AC
  Administered 2016-01-20: 5 [IU] via INTRAVENOUS
  Filled 2016-01-20: qty 5

## 2016-01-20 MED ORDER — DEXTROSE 50 % IV SOLN
50.0000 mL | Freq: Once | INTRAVENOUS | Status: AC
  Administered 2016-01-20: 50 mL via INTRAVENOUS

## 2016-01-20 MED ORDER — MAGNESIUM SULFATE 2 GM/50ML IV SOLN
2.0000 g | Freq: Once | INTRAVENOUS | Status: AC
  Administered 2016-01-20: 2 g via INTRAVENOUS
  Filled 2016-01-20: qty 50

## 2016-01-20 MED ORDER — CEFEPIME-DEXTROSE 2 GM/50ML IV SOLR
2.0000 g | Freq: Once | INTRAVENOUS | Status: AC
  Administered 2016-01-20: 2 g via INTRAVENOUS
  Filled 2016-01-20: qty 50

## 2016-01-20 MED ORDER — SODIUM CHLORIDE 0.9 % IV BOLUS (SEPSIS)
1000.0000 mL | Freq: Once | INTRAVENOUS | Status: AC
  Administered 2016-01-20: 1000 mL via INTRAVENOUS

## 2016-01-20 MED ORDER — METOCLOPRAMIDE HCL 5 MG/ML IJ SOLN
INTRAMUSCULAR | Status: AC
  Filled 2016-01-20: qty 2

## 2016-01-20 MED ORDER — SODIUM BICARBONATE 8.4 % IV SOLN
INTRAVENOUS | Status: DC
  Administered 2016-01-21 – 2016-01-23 (×6): via INTRAVENOUS
  Filled 2016-01-20 (×13): qty 150

## 2016-01-20 MED ORDER — VANCOMYCIN HCL IN DEXTROSE 1-5 GM/200ML-% IV SOLN
1000.0000 mg | Freq: Once | INTRAVENOUS | Status: DC
  Filled 2016-01-20 (×2): qty 200

## 2016-01-20 MED ORDER — INSULIN ASPART 100 UNIT/ML ~~LOC~~ SOLN
SUBCUTANEOUS | Status: AC
  Filled 2016-01-20: qty 1

## 2016-01-20 MED ORDER — DEXTROSE 50 % IV SOLN
25.0000 mL | Freq: Once | INTRAVENOUS | Status: DC
  Filled 2016-01-20: qty 50

## 2016-01-20 NOTE — ED Notes (Signed)
This RN to bedside at this time, pt's family states patient said she needed to go to the bathroom, this RN rolled patient with Paulette, EDT, pt states, "I don't have to go to the bathroom". This RN assessed skin on patient's buttocks at this time.

## 2016-01-20 NOTE — ED Triage Notes (Signed)
Pt presents to ED via [facility] with c/o failure to thrive and hypotension from [facility]. Pt has hx of arthritis and dementia, has been refusing to eat x 2 days. Pt presents with booties to her feet. Per EMS pt cries out when she is moved, and came to ED because she doesn't want to stay at [facility].

## 2016-01-20 NOTE — ED Notes (Signed)
Report given to Rebecca, RN
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
## 2016-01-20 NOTE — ED Notes (Signed)
MD aware of patient's BP. Pt placed in Trendelenburg at this time, received verbal orders for 1L NS at this time.

## 2016-01-20 NOTE — ED Notes (Signed)
Dr. Anne Hahn and this RN to bedside, Dr. Anne Hahn to assess patient and this RN to administer meds. Pt returned from head CT at this time.

## 2016-01-20 NOTE — ED Notes (Signed)
Report called to Leslie, RN
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
## 2016-01-20 NOTE — ED Notes (Signed)
Pt's family spoke with this RN without patient presents. Per pt's son the family attempted to have her commited approx 2 weeks ago without success. Pt's son reports that recently her boyfriend has come back into town and since then patient has "had a complete 360 in her behavior", pt's son states pt noted to be more volatile with family and staff. Pt's son states that patient has a hx of patient attempting to make herself sick when eating food she doesn't like. Son also reports decrease in eating and drinking at this time that "is enabled by her boyfriend".

## 2016-01-21 DIAGNOSIS — L8990 Pressure ulcer of unspecified site, unspecified stage: Secondary | ICD-10-CM | POA: Insufficient documentation

## 2016-01-21 DIAGNOSIS — R410 Disorientation, unspecified: Secondary | ICD-10-CM

## 2016-01-21 LAB — BASIC METABOLIC PANEL
ANION GAP: 10 (ref 5–15)
ANION GAP: 9 (ref 5–15)
Anion gap: 14 (ref 5–15)
Anion gap: 11 (ref 5–15)
Anion gap: 11 (ref 5–15)
BUN: 85 mg/dL — AB (ref 6–20)
BUN: 76 mg/dL — AB (ref 6–20)
BUN: 76 mg/dL — AB (ref 6–20)
BUN: 73 mg/dL — AB (ref 6–20)
BUN: 69 mg/dL — AB (ref 6–20)
CHLORIDE: 110 mmol/L (ref 101–111)
CHLORIDE: 112 mmol/L — AB (ref 101–111)
CHLORIDE: 111 mmol/L (ref 101–111)
CHLORIDE: 111 mmol/L (ref 101–111)
CHLORIDE: 110 mmol/L (ref 101–111)
CO2: 10 mmol/L — AB (ref 22–32)
CO2: 14 mmol/L — ABNORMAL LOW (ref 22–32)
CO2: 15 mmol/L — ABNORMAL LOW (ref 22–32)
CO2: 16 mmol/L — ABNORMAL LOW (ref 22–32)
CO2: 19 mmol/L — ABNORMAL LOW (ref 22–32)
Calcium: 7.7 mg/dL — ABNORMAL LOW (ref 8.9–10.3)
Calcium: 7.1 mg/dL — ABNORMAL LOW (ref 8.9–10.3)
Calcium: 6.9 mg/dL — ABNORMAL LOW (ref 8.9–10.3)
Calcium: 6.8 mg/dL — ABNORMAL LOW (ref 8.9–10.3)
Calcium: 6.7 mg/dL — ABNORMAL LOW (ref 8.9–10.3)
Creatinine, Ser: 6.81 mg/dL — ABNORMAL HIGH (ref 0.44–1.00)
Creatinine, Ser: 5.82 mg/dL — ABNORMAL HIGH (ref 0.44–1.00)
Creatinine, Ser: 5.31 mg/dL — ABNORMAL HIGH (ref 0.44–1.00)
Creatinine, Ser: 4.81 mg/dL — ABNORMAL HIGH (ref 0.44–1.00)
Creatinine, Ser: 4.48 mg/dL — ABNORMAL HIGH (ref 0.44–1.00)
GFR calc Af Amer: 7 mL/min — ABNORMAL LOW (ref >60)
GFR calc Af Amer: 8 mL/min — ABNORMAL LOW (ref >60)
GFR calc Af Amer: 9 mL/min — ABNORMAL LOW (ref >60)
GFR calc Af Amer: 10 mL/min — ABNORMAL LOW (ref >60)
GFR calc Af Amer: 11 mL/min — ABNORMAL LOW (ref >60)
GFR calc non Af Amer: 6 mL/min — ABNORMAL LOW (ref >60)
GFR calc non Af Amer: 7 mL/min — ABNORMAL LOW (ref >60)
GFR calc non Af Amer: 8 mL/min — ABNORMAL LOW (ref >60)
GFR calc non Af Amer: 9 mL/min — ABNORMAL LOW (ref >60)
GFR calc non Af Amer: 10 mL/min — ABNORMAL LOW (ref >60)
GLUCOSE: 156 mg/dL — AB (ref 65–99)
GLUCOSE: 124 mg/dL — AB (ref 65–99)
GLUCOSE: 122 mg/dL — AB (ref 65–99)
Glucose, Bld: 116 mg/dL — ABNORMAL HIGH (ref 65–99)
Glucose, Bld: 127 mg/dL — ABNORMAL HIGH (ref 65–99)
POTASSIUM: 4.4 mmol/L (ref 3.5–5.1)
POTASSIUM: 4 mmol/L (ref 3.5–5.1)
POTASSIUM: 4 mmol/L (ref 3.5–5.1)
POTASSIUM: 3.7 mmol/L (ref 3.5–5.1)
Potassium: 5 mmol/L (ref 3.5–5.1)
SODIUM: 137 mmol/L (ref 135–145)
SODIUM: 137 mmol/L (ref 135–145)
SODIUM: 138 mmol/L (ref 135–145)
Sodium: 134 mmol/L — ABNORMAL LOW (ref 135–145)
Sodium: 137 mmol/L (ref 135–145)

## 2016-01-21 LAB — ALBUMIN: Albumin: 1.8 g/dL — ABNORMAL LOW (ref 3.5–5.0)

## 2016-01-21 LAB — URINALYSIS, ROUTINE W REFLEX MICROSCOPIC
BILIRUBIN URINE: NEGATIVE
Glucose, UA: NEGATIVE mg/dL
HGB URINE DIPSTICK: NEGATIVE
KETONES UR: 5 mg/dL — AB
Nitrite: NEGATIVE
PROTEIN: 100 mg/dL — AB
SPECIFIC GRAVITY, URINE: 1.021 (ref 1.005–1.030)
pH: 5 (ref 5.0–8.0)

## 2016-01-21 LAB — CBC
HEMATOCRIT: 25.2 % — AB (ref 35.0–47.0)
HEMOGLOBIN: 8.2 g/dL — AB (ref 12.0–16.0)
MCH: 30.7 pg (ref 26.0–34.0)
MCHC: 32.7 g/dL (ref 32.0–36.0)
MCV: 93.9 fL (ref 80.0–100.0)
Platelets: 220 10*3/uL (ref 150–440)
RBC: 2.68 MIL/uL — AB (ref 3.80–5.20)
RDW: 19.3 % — AB (ref 11.5–14.5)
WBC: 15.1 10*3/uL — AB (ref 3.6–11.0)

## 2016-01-21 LAB — GLUCOSE, CAPILLARY: Glucose-Capillary: 138 mg/dL — ABNORMAL HIGH (ref 65–99)

## 2016-01-21 LAB — TROPONIN I
Troponin I: 0.08 ng/mL (ref <0.03)
Troponin I: 0.04 ng/mL (ref <0.03)
Troponin I: 0.03 ng/mL (ref <0.03)

## 2016-01-21 LAB — MRSA PCR SCREENING: MRSA BY PCR: NEGATIVE

## 2016-01-21 MED ORDER — THIAMINE HCL 100 MG/ML IJ SOLN
100.0000 mg | Freq: Every day | INTRAMUSCULAR | Status: DC
  Administered 2016-01-22: 100 mg via INTRAVENOUS
  Filled 2016-01-21: qty 2

## 2016-01-21 MED ORDER — CEFEPIME-DEXTROSE 1 GM/50ML IV SOLR
1.0000 g | INTRAVENOUS | Status: DC
  Administered 2016-01-21 – 2016-01-23 (×3): 1 g via INTRAVENOUS
  Filled 2016-01-21 (×4): qty 50

## 2016-01-21 MED ORDER — VITAMIN B-1 100 MG PO TABS
100.0000 mg | ORAL_TABLET | Freq: Every day | ORAL | Status: DC
  Administered 2016-01-23: 100 mg via ORAL
  Filled 2016-01-21 (×2): qty 1

## 2016-01-21 MED ORDER — LORAZEPAM 2 MG PO TABS
0.0000 mg | ORAL_TABLET | Freq: Two times a day (BID) | ORAL | Status: DC
Start: 2016-01-23 — End: 2016-01-24
  Administered 2016-01-23 – 2016-01-24 (×3): 2 mg via ORAL
  Filled 2016-01-21: qty 2
  Filled 2016-01-21: qty 1
  Filled 2016-01-21: qty 2

## 2016-01-21 MED ORDER — SODIUM CHLORIDE 0.9 % IV BOLUS (SEPSIS)
1000.0000 mL | Freq: Once | INTRAVENOUS | Status: AC
  Administered 2016-01-21: 1000 mL via INTRAVENOUS

## 2016-01-21 MED ORDER — LORAZEPAM 1 MG PO TABS
1.0000 mg | ORAL_TABLET | Freq: Four times a day (QID) | ORAL | Status: DC | PRN
Start: 2016-01-21 — End: 2016-01-24
  Administered 2016-01-23: 1 mg via ORAL
  Filled 2016-01-21: qty 1

## 2016-01-21 MED ORDER — ACETAMINOPHEN 325 MG PO TABS
650.0000 mg | ORAL_TABLET | Freq: Four times a day (QID) | ORAL | Status: DC | PRN
  Administered 2016-01-22 (×2): 650 mg via ORAL
  Filled 2016-01-21 (×2): qty 2

## 2016-01-21 MED ORDER — ACETAMINOPHEN 650 MG RE SUPP: 650.0000 mg | Freq: Four times a day (QID) | RECTAL | Status: DC | PRN

## 2016-01-21 MED ORDER — LORAZEPAM 2 MG/ML IJ SOLN
1.0000 mg | Freq: Four times a day (QID) | INTRAMUSCULAR | Status: DC | PRN
  Administered 2016-01-21: 1 mg via INTRAVENOUS
  Filled 2016-01-21: qty 1

## 2016-01-21 MED ORDER — FOLIC ACID 1 MG PO TABS
1.0000 mg | ORAL_TABLET | Freq: Every day | ORAL | Status: DC
  Administered 2016-01-22 – 2016-01-23 (×2): 1 mg via ORAL
  Filled 2016-01-21 (×3): qty 1

## 2016-01-21 MED ORDER — SODIUM CHLORIDE 0.9 % IV SOLN
INTRAVENOUS | Status: DC
  Administered 2016-01-21 (×2): via INTRAVENOUS

## 2016-01-21 MED ORDER — ONDANSETRON HCL 4 MG PO TABS: 4.0000 mg | ORAL_TABLET | Freq: Four times a day (QID) | ORAL | Status: DC | PRN

## 2016-01-21 MED ORDER — LORAZEPAM 2 MG PO TABS
0.0000 mg | ORAL_TABLET | Freq: Four times a day (QID) | ORAL | Status: AC
  Administered 2016-01-22 (×2): 2 mg via ORAL
  Filled 2016-01-21 (×2): qty 1

## 2016-01-21 MED ORDER — SODIUM CHLORIDE 0.9 % IV SOLN
500.0000 mg | Freq: Once | INTRAVENOUS | Status: AC
  Administered 2016-01-21: 500 mg via INTRAVENOUS
  Filled 2016-01-21: qty 500

## 2016-01-21 MED ORDER — ADULT MULTIVITAMIN W/MINERALS CH
1.0000 | ORAL_TABLET | Freq: Every day | ORAL | Status: DC
  Administered 2016-01-22 – 2016-01-23 (×2): 1 via ORAL
  Filled 2016-01-21 (×3): qty 1

## 2016-01-21 MED ORDER — ONDANSETRON HCL 4 MG/2ML IJ SOLN: 4.0000 mg | Freq: Four times a day (QID) | INTRAMUSCULAR | Status: DC | PRN

## 2016-01-21 MED ORDER — COLLAGENASE 250 UNIT/GM EX OINT
TOPICAL_OINTMENT | Freq: Every day | CUTANEOUS | Status: DC
  Administered 2016-01-22: 1 via TOPICAL
  Administered 2016-01-22: 01:00:00 via TOPICAL
  Filled 2016-01-21: qty 30

## 2016-01-21 MED ORDER — MAGIC MOUTHWASH W/LIDOCAINE
5.0000 mL | Freq: Three times a day (TID) | ORAL | Status: DC | PRN
  Filled 2016-01-21: qty 5

## 2016-01-21 MED ORDER — HEPARIN SODIUM (PORCINE) 5000 UNIT/ML IJ SOLN
5000.0000 [IU] | Freq: Three times a day (TID) | INTRAMUSCULAR | Status: DC
  Administered 2016-01-21 – 2016-01-24 (×8): 5000 [IU] via SUBCUTANEOUS
  Filled 2016-01-21 (×8): qty 1

## 2016-01-21 MED ORDER — SODIUM CHLORIDE 0.9% FLUSH
3.0000 mL | Freq: Two times a day (BID) | INTRAVENOUS | Status: DC
  Administered 2016-01-21 – 2016-01-23 (×6): 3 mL via INTRAVENOUS

## 2016-01-21 MED ORDER — MUPIROCIN CALCIUM 2 % EX CREA
TOPICAL_CREAM | Freq: Every day | CUTANEOUS | Status: DC
  Administered 2016-01-21: 14:00:00 via TOPICAL
  Administered 2016-01-22: 1 via TOPICAL
  Administered 2016-01-23 – 2016-01-24 (×2): via TOPICAL
  Filled 2016-01-21: qty 15

## 2016-01-21 NOTE — Progress Notes (Signed)
Pt accepted in transfer from [facility]. Pt alert, denies any pain.  Wound care nurse saw her, padded boots in place.

## 2016-01-21 NOTE — Consult Note (Signed)
[facility] Consult   Reason for Consult:  Consult for 60 year old woman with a report that she has not eaten for several weeks. Referring Physician:  Gouru Patient Identification: Ruth Price MRN:  [id number] Principal Diagnosis: Acute delirium Diagnosis:   Patient Active Problem List   Diagnosis Date Noted  . Pressure injury of skin [L89.90] 01/21/2016  . Acute delirium [R41.0] 01/21/2016  . Septic shock ([place]) [A41.9, R65.21] 01/20/2016  . AKI (acute kidney injury) ([place]) [N17.9] 01/20/2016  . HTN (hypertension) [I10] 01/20/2016  . RA (rheumatoid arthritis) ([place]) [M06.9] 01/20/2016  . HLD (hyperlipidemia) [E78.5] 01/20/2016  . Severe sepsis ([place]) [A41.9, R65.20] 01/20/2016  . Hyperkalemia [E87.5] 01/20/2016  . Protein-calorie malnutrition, severe [E43] 10/30/2015  . Alzheimer's dementia [G30.9] 10/27/2015  . Alcohol abuse [F10.10] 10/27/2015  . 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 CVA (cerebral infarction) [I63.9] 06/08/2015    Total Time spent with patient: 45 minutes  Subjective:   Ruth Price is a 60 y.o. female patient admitted with patient not able to give any information.  HPI:  Came to see the patient. Reviewed the chart. Patient was unarousable. I turned on the lights spoke her name loudly gently shook her by the shoulder patient didn't make any response at all. Liquor at her eyelids briefly. No verbal interaction. This is a 60 year old woman with a history of chronic multiple medical problems but who appears at least as late as this summer to have still been able to engage in normal conversation and attend doctor's appointments. There was some report of some memory problems but the description of her interactions was nothing like her current presentation. She appears to of lost probably well over 20 pounds in a person who was already small. Her current labs are extremely abnormal. Creatinine which as late as this summer was only slightly  abnormal has shot up through the roof. Multiple abnormalities. I do see from some of the notes that some providers have been able to interact with her verbally over the last couple days although it sounds like that was incoherent as well. Probably is having a delirium. Question as to why she has not eaten for a couple weeks which is certainly possible. Certainly depression could be one cause of this although it's hard to separate from all her other medical problems.  Social history: Evidently some information came from some of the relatives. I'm not sure about where her most recent residence was.  Medical history: Rheumatoid arthritis history of osteomyelitis history of infections currently with severe malnutrition. Alzheimer's dementia is mention but I think that is probably inaccurate. Patient is far too young for that and nothing in the previous history sounds like it was the case. She does have a past history of a stroke but apparently was still in reasonably good condition even after that.  Substance abuse history: Family is reported to of express concerns about alcohol use. I don't really see anything else I can put my finger on in the old chart however about alcohol use history.  Past Psychiatric History: I don't think I can find anything very clearly to indicate previous psychiatric problems. No known history of severe depression or suicide attempts.  Risk to Self: Is patient at risk for suicide?: No Risk to Others:   Prior Inpatient Therapy:   Prior Outpatient Therapy:    Past Medical History:  Past Medical History:  Diagnosis Date  . Alcohol abuse   . Alzheimer's disease   . 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  Cerebral infarction ([place])   . Dementia   . Difficulty walking   . Essential hypertension   . Hip pain, chronic, right   . Hyperlipidemia   . Hypertension   . Muscle wasting   . Osteomyelitis ([place])   . RA (rheumatoid arthritis) ([place])   . Raynaud's syndrome   . Rheumatoid  arthritis ([place])   . Vascular dementia without behavioral disturbance     Past Surgical History:  Procedure Laterality Date  . APPLICATION OF WOUND VAC Left 10/31/2015   Procedure: APPLICATION OF WOUND VAC;  Surgeon: Samara Deist, DPM;  Location: [facility] ORS;  Service: Podiatry;  Laterality: Left;  . IRRIGATION AND DEBRIDEMENT FOOT Left 10/31/2015   Procedure: IRRIGATION AND DEBRIDEMENT FOOT;  Surgeon: Samara Deist, DPM;  Location: [facility] ORS;  Service: Podiatry;  Laterality: Left;  . JOINT REPLACEMENT    . SPINE SURGERY     Family History:  Family History  Problem Relation Age of Onset  . Diabetes Mother   . Heart disease Father    Family Psychiatric  History: Unknown Social History:  History  Alcohol Use No     History  Drug Use No    Social History   Social History  . Marital status: Widowed    Spouse name: N/A  . Number of children: N/A  . Years of education: N/A   Social History Main Topics  . Smoking status: Never Smoker  . Smokeless tobacco: Never Used  . Alcohol use No  . Drug use: No  . Sexual activity: Not Asked   Other Topics Concern  . None   Social History Narrative  . None   Additional Social History:    Allergies:   Allergies  Allergen Reactions  . Levofloxacin Other (See Comments)    Reaction:  Unknown     Labs:  Results for orders placed or performed during the hospital encounter of 01/20/16 (from the past 48 hour(s))  CBC with Differential/Platelet     Status: Abnormal   Collection Time: 01/20/16  8:12 PM  Result Value Ref Range   WBC 37.7 (H) 3.6 - 11.0 K/uL   RBC 3.75 (L) 3.80 - 5.20 MIL/uL   Hemoglobin 11.3 (L) 12.0 - 16.0 g/dL   HCT 36.2 35.0 - 47.0 %   MCV 96.7 80.0 - 100.0 fL   MCH 30.1 26.0 - 34.0 pg   MCHC 31.1 (L) 32.0 - 36.0 g/dL   RDW 19.9 (H) 11.5 - 14.5 %   Platelets 378 150 - 440 K/uL   Neutrophils Relative % 93 %   Neutro Abs 35.1 (H) 1.4 - 6.5 K/uL   Lymphocytes Relative 3 %   Lymphs Abs 1.1 1.0 - 3.6 K/uL    Monocytes Relative 3 %   Monocytes Absolute 1.2 (H) 0.2 - 0.9 K/uL   Eosinophils Relative 0 %   Eosinophils Absolute 0.0 0 - 0.7 K/uL   Basophils Relative 1 %   Basophils Absolute 0.2 (H) 0 - 0.1 K/uL  Comprehensive metabolic panel     Status: Abnormal   Collection Time: 01/20/16  8:12 PM  Result Value Ref Range   Sodium 133 (L) 135 - 145 mmol/L   Potassium 6.7 (HH) 3.5 - 5.1 mmol/L    Comment: CRITICAL RESULT CALLED TO, READ BACK BY AND VERIFIED WITH MEGAN JONES ON 01/20/16 AT 2050 QSD    Chloride 104 101 - 111 mmol/L   CO2 <7 (L) 22 - 32 mmol/L   Glucose, Bld 68 65 - 99  mg/dL   BUN 93 (H) 6 - 20 mg/dL   Creatinine, Ser 7.66 (H) 0.44 - 1.00 mg/dL   Calcium 8.7 (L) 8.9 - 10.3 mg/dL   Total Protein 7.5 6.5 - 8.1 g/dL   Albumin 2.8 (L) 3.5 - 5.0 g/dL   AST 17 15 - 41 U/L   ALT 14 14 - 54 U/L   Alkaline Phosphatase 107 38 - 126 U/L   Total Bilirubin 1.6 (H) 0.3 - 1.2 mg/dL   GFR calc non Af Amer 5 (L) >60 mL/min   GFR calc Af Amer 6 (L) >60 mL/min    Comment: (NOTE) The eGFR has been calculated using the CKD EPI equation. This calculation has not been validated in all clinical situations. eGFR's persistently <60 mL/min signify possible Chronic Kidney Disease.    Anion gap SEE COMMENTS 5 - 15    Comment: NOT CALCULATED  Magnesium     Status: Abnormal   Collection Time: 01/20/16  8:12 PM  Result Value Ref Range   Magnesium 1.5 (L) 1.7 - 2.4 mg/dL  Troponin I     Status: Abnormal   Collection Time: 01/20/16  8:12 PM  Result Value Ref Range   Troponin I 0.07 (HH) <0.03 ng/mL    Comment: CRITICAL RESULT CALLED TO, READ BACK BY AND VERIFIED WITH MEGAN JONES ON 01/20/16 AT 2050 QSD   Lactic acid, plasma     Status: None   Collection Time: 01/20/16  9:24 PM  Result Value Ref Range   Lactic Acid, Venous 0.9 0.5 - 1.9 mmol/L  Culture, blood (Routine X 2) w Reflex to ID Panel     Status: None (Preliminary result)   Collection Time: 01/20/16  9:49 PM  Result Value Ref Range    Specimen Description BLOOD LEFT FOREARM    Special Requests BOTTLES DRAWN AEROBIC AND ANAEROBIC [id number]    Culture NO GROWTH < 12 HOURS    Report Status PENDING   Culture, blood (Routine X 2) w Reflex to ID Panel     Status: None (Preliminary result)   Collection Time: 01/20/16  9:49 PM  Result Value Ref Range   Specimen Description BLOOD LEFT FOREARM    Special Requests BOTTLES DRAWN AEROBIC AND ANAEROBIC [place]    Culture NO GROWTH < 12 HOURS    Report Status PENDING   Glucose, capillary     Status: Abnormal   Collection Time: 01/20/16 11:38 PM  Result Value Ref Range   Glucose-Capillary 138 (H) 65 - 99 mg/dL  MRSA PCR Screening     Status: None   Collection Time: 01/21/16 12:21 AM  Result Value Ref Range   MRSA by PCR NEGATIVE NEGATIVE    Comment:        The GeneXpert MRSA Assay (FDA approved for NASAL specimens only), is one component of a comprehensive MRSA colonization surveillance program. It is not intended to diagnose MRSA infection nor to guide or monitor treatment for MRSA infections.   Urinalysis, Routine w reflex microscopic     Status: Abnormal   Collection Time: 01/21/16 12:57 AM  Result Value Ref Range   Color, Urine AMBER (A) YELLOW    Comment: BIOCHEMICALS MAY BE AFFECTED BY COLOR   APPearance CLOUDY (A) CLEAR   Specific Gravity, Urine 1.021 1.005 - 1.030   pH 5.0 5.0 - 8.0   Glucose, UA NEGATIVE NEGATIVE mg/dL   Hgb urine dipstick NEGATIVE NEGATIVE   Bilirubin Urine NEGATIVE NEGATIVE   Ketones, ur 5 (A) NEGATIVE mg/dL  Protein, ur 100 (A) NEGATIVE mg/dL   Nitrite NEGATIVE NEGATIVE   Leukocytes, UA MODERATE (A) NEGATIVE   RBC / HPF 6-30 0 - 5 RBC/hpf   WBC, UA 6-30 0 - 5 WBC/hpf   Bacteria, UA RARE (A) NONE SEEN   Squamous Epithelial / LPF 0-5 (A) NONE SEEN   Hyaline Casts, UA PRESENT    Amorphous Crystal PRESENT   Troponin I     Status: Abnormal   Collection Time: 01/21/16  3:06 AM  Result Value Ref Range   Troponin I 0.08 (HH)  <0.03 ng/mL    Comment: CRITICAL VALUE NOTED. VALUE IS CONSISTENT WITH PREVIOUSLY REPORTED/CALLED VALUE.PMH  Basic metabolic panel     Status: Abnormal   Collection Time: 01/21/16  3:06 AM  Result Value Ref Range   Sodium 134 (L) 135 - 145 mmol/L   Potassium 5.0 3.5 - 5.1 mmol/L   Chloride 110 101 - 111 mmol/L   CO2 10 (L) 22 - 32 mmol/L   Glucose, Bld 156 (H) 65 - 99 mg/dL   BUN 85 (H) 6 - 20 mg/dL   Creatinine, Ser 6.81 (H) 0.44 - 1.00 mg/dL   Calcium 7.7 (L) 8.9 - 10.3 mg/dL   GFR calc non Af Amer 6 (L) >60 mL/min   GFR calc Af Amer 7 (L) >60 mL/min    Comment: (NOTE) The eGFR has been calculated using the CKD EPI equation. This calculation has not been validated in all clinical situations. eGFR's persistently <60 mL/min signify possible Chronic Kidney Disease.    Anion gap 14 5 - 15  Troponin I     Status: Abnormal   Collection Time: 01/21/16  7:14 AM  Result Value Ref Range   Troponin I 0.04 (HH) <0.03 ng/mL    Comment: CRITICAL VALUE NOTED. VALUE IS CONSISTENT WITH PREVIOUSLY REPORTED/CALLED VALUE JLJ  Basic metabolic panel     Status: Abnormal   Collection Time: 01/21/16  7:14 AM  Result Value Ref Range   Sodium 137 135 - 145 mmol/L   Potassium 4.4 3.5 - 5.1 mmol/L   Chloride 112 (H) 101 - 111 mmol/L   CO2 14 (L) 22 - 32 mmol/L   Glucose, Bld 124 (H) 65 - 99 mg/dL   BUN 76 (H) 6 - 20 mg/dL   Creatinine, Ser 5.82 (H) 0.44 - 1.00 mg/dL   Calcium 7.1 (L) 8.9 - 10.3 mg/dL   GFR calc non Af Amer 7 (L) >60 mL/min   GFR calc Af Amer 8 (L) >60 mL/min    Comment: (NOTE) The eGFR has been calculated using the CKD EPI equation. This calculation has not been validated in all clinical situations. eGFR's persistently <60 mL/min signify possible Chronic Kidney Disease.    Anion gap 11 5 - 15  CBC     Status: Abnormal   Collection Time: 01/21/16  7:14 AM  Result Value Ref Range   WBC 15.1 (H) 3.6 - 11.0 K/uL   RBC 2.68 (L) 3.80 - 5.20 MIL/uL   Hemoglobin 8.2 (L) 12.0 - 16.0  g/dL    Comment: RESULT REPEATED AND VERIFIED   HCT 25.2 (L) 35.0 - 47.0 %   MCV 93.9 80.0 - 100.0 fL   MCH 30.7 26.0 - 34.0 pg   MCHC 32.7 32.0 - 36.0 g/dL   RDW 19.3 (H) 11.5 - 14.5 %   Platelets 220 150 - 440 K/uL  Basic metabolic panel     Status: Abnormal   Collection Time: 01/21/16 10:57 AM  Result Value Ref Range   Sodium 137 135 - 145 mmol/L   Potassium 4.0 3.5 - 5.1 mmol/L   Chloride 111 101 - 111 mmol/L   CO2 15 (L) 22 - 32 mmol/L   Glucose, Bld 122 (H) 65 - 99 mg/dL   BUN 76 (H) 6 - 20 mg/dL   Creatinine, Ser 5.31 (H) 0.44 - 1.00 mg/dL   Calcium 6.9 (L) 8.9 - 10.3 mg/dL   GFR calc non Af Amer 8 (L) >60 mL/min   GFR calc Af Amer 9 (L) >60 mL/min    Comment: (NOTE) The eGFR has been calculated using the CKD EPI equation. This calculation has not been validated in all clinical situations. eGFR's persistently <60 mL/min signify possible Chronic Kidney Disease.    Anion gap 11 5 - 15  Troponin I     Status: Abnormal   Collection Time: 01/21/16  2:07 PM  Result Value Ref Range   Troponin I 0.03 (HH) <0.03 ng/mL    Comment: CRITICAL VALUE NOTED. VALUE IS CONSISTENT WITH PREVIOUSLY REPORTED/CALLED VALUE JLJ  Basic metabolic panel     Status: Abnormal   Collection Time: 01/21/16  2:07 PM  Result Value Ref Range   Sodium 137 135 - 145 mmol/L   Potassium 4.0 3.5 - 5.1 mmol/L   Chloride 111 101 - 111 mmol/L   CO2 16 (L) 22 - 32 mmol/L   Glucose, Bld 116 (H) 65 - 99 mg/dL   BUN 73 (H) 6 - 20 mg/dL   Creatinine, Ser 4.81 (H) 0.44 - 1.00 mg/dL   Calcium 6.8 (L) 8.9 - 10.3 mg/dL   GFR calc non Af Amer 9 (L) >60 mL/min   GFR calc Af Amer 10 (L) >60 mL/min    Comment: (NOTE) The eGFR has been calculated using the CKD EPI equation. This calculation has not been validated in all clinical situations. eGFR's persistently <60 mL/min signify possible Chronic Kidney Disease.    Anion gap 10 5 - 15  Albumin     Status: Abnormal   Collection Time: 01/21/16  2:07 PM  Result  Value Ref Range   Albumin 1.8 (L) 3.5 - 5.0 g/dL  Basic metabolic panel     Status: Abnormal   Collection Time: 01/21/16  6:00 PM  Result Value Ref Range   Sodium 138 135 - 145 mmol/L   Potassium 3.7 3.5 - 5.1 mmol/L   Chloride 110 101 - 111 mmol/L   CO2 19 (L) 22 - 32 mmol/L   Glucose, Bld 127 (H) 65 - 99 mg/dL   BUN 69 (H) 6 - 20 mg/dL   Creatinine, Ser 4.48 (H) 0.44 - 1.00 mg/dL   Calcium 6.7 (L) 8.9 - 10.3 mg/dL   GFR calc non Af Amer 10 (L) >60 mL/min   GFR calc Af Amer 11 (L) >60 mL/min    Comment: (NOTE) The eGFR has been calculated using the CKD EPI equation. This calculation has not been validated in all clinical situations. eGFR's persistently <60 mL/min signify possible Chronic Kidney Disease.    Anion gap 9 5 - 15    Current Facility-Administered Medications  Medication Dose Route Frequency Provider Last Rate Last Dose  . acetaminophen (TYLENOL) tablet 650 mg  650 mg Oral Q6H PRN Lance Coon, MD       Or  . acetaminophen (TYLENOL) suppository 650 mg  650 mg Rectal Q6H PRN Lance Coon, MD      . ceFEPIme (MAXIPIME) 1 GM / 40m IVPB premix  1 g Intravenous Q24H Lance Coon, MD   1 g at 01/21/16 1703  . collagenase (SANTYL) ointment   Topical Daily Nicholes Mango, MD      . folic acid (FOLVITE) tablet 1 mg  1 mg Oral Daily Lance Coon, MD      . heparin injection 5,000 Units  5,000 Units Subcutaneous Q8H Lance Coon, MD   5,000 Units at 01/21/16 2003  . LORazepam (ATIVAN) tablet 1 mg  1 mg Oral Q6H PRN Lance Coon, MD       Or  . LORazepam (ATIVAN) injection 1 mg  1 mg Intravenous Q6H PRN Lance Coon, MD   1 mg at 01/21/16 1848  . LORazepam (ATIVAN) tablet 0-4 mg  0-4 mg Oral Q6H Lance Coon, MD       Followed by  . [START ON 01/23/2016] LORazepam (ATIVAN) tablet 0-4 mg  0-4 mg Oral Q12H Lance Coon, MD      . magic mouthwash w/lidocaine  5 mL Oral TID PRN Lance Coon, MD      . multivitamin with minerals tablet 1 tablet  1 tablet Oral Daily Lance Coon, MD       . mupirocin cream (BACTROBAN) 2 %   Topical Daily Nicholes Mango, MD      . ondansetron [facility]) tablet 4 mg  4 mg Oral Q6H PRN Lance Coon, MD       Or  . ondansetron [facility]) injection 4 mg  4 mg Intravenous Q6H PRN Lance Coon, MD      . sodium bicarbonate 150 mEq in dextrose 5 % 1,000 mL infusion   Intravenous Continuous Lance Coon, MD 100 mL/hr at 01/21/16 1500    . sodium chloride flush (NS) 0.9 % injection 3 mL  3 mL Intravenous Q12H Lance Coon, MD   3 mL at 01/21/16 2003  . thiamine (VITAMIN B-1) tablet 100 mg  100 mg Oral Daily Lance Coon, MD       Or  . thiamine (B-1) injection 100 mg  100 mg Intravenous Daily Lance Coon, MD        Musculoskeletal: Strength & Muscle Tone: atrophy Gait & Station: unable to stand Patient leans: N/A  Psychiatric Specialty Exam: Physical Exam  Constitutional: She appears well-nourished. She appears cachectic.  HENT:  Head: Normocephalic and atraumatic.  Eyes: Conjunctivae are normal. Pupils are equal, round, and reactive to light.  Neck: Normal range of motion.  Cardiovascular: Normal heart sounds.   Respiratory: Effort normal.  GI: Soft.  Musculoskeletal: Normal range of motion.  Skin: Skin is warm and dry.  Psychiatric:  Patient was unarousable    Review of Systems  Unable to perform ROS: Medical condition    Blood pressure (!) 91/46, pulse 92, temperature 98.4 F (36.9 C), temperature source Oral, resp. rate 16, height 5' 3"  (1.6 m), weight 38.2 kg (84 lb 3.5 oz), SpO2 98 %.Body mass index is 14.92 kg/m.  General Appearance: Very sick  Eye Contact:  None  Speech:  Negative  Volume:  Negative  Mood:  Negative  Affect:  Negative  Thought Process:  NA  Orientation:  Negative  Thought Content:  Negative  Suicidal Thoughts:  Unknown  Homicidal Thoughts:  Unknown  Memory:  Negative  Judgement:  Negative  Insight:  Negative  Psychomotor Activity:  Negative  Concentration:  Concentration: Negative  Recall:  Negative   Fund of Knowledge:  Negative  Language:  Negative  Akathisia:  Negative  Handed:  Right  AIMS (if indicated):  Assets:  Social Support  ADL's:  Impaired  Cognition:  Impaired,  Severe  Sleep:        Treatment Plan Summary: Plan 60 year old woman who currently presents as very profoundly demented and sick and delirious. Multiple severe obvious medical problems. I'm not sure if I can be of any assistance. Depression would be one reason for a person to stop eating although delirium from multiple other medical problems would be possible as well. Currently the patient appears to be in extreme distress. Decisions need to be made about how aggressive she should be re-fed. I am not going to add any psychiatric medicine at this point as it appears to be currently somewhat beside the point but I will try to follow as needed.  Disposition: See note above  Alethia Berthold, MD 01/21/2016 9:38 PM

## 2016-01-21 NOTE — Consult Note (Signed)
[facility] CONSULT NOTE    Date: 01/21/2016                  Patient Name:  Ruth Price  MRN: [id number]  DOB: December 12, 1955  Age / Sex: 60 y.o., female         PCP: Jonathon BellowsALCALA, BETH C, NP                 Service Requesting Consult: Hospitalist                 Reason for Consult: Acute renal failure, CKD stage III            History of Present Illness: Patient is a 60 y.o. female with a PMHx of Alcohol abuse, also tremors dementia, psoriasis, hypertension, hyperlipidemia, history of osteomyelitis, rheumatoid arthritis, Raynaud's syndrome, who was admitted to [facility] on 01/20/2016 for evaluation of failure to thrive. She is a nursing home resident. She was sent over for severe electrolyte derangements with acute renal failure and hyperkalemia. She also had significant metabolic acidosis. She has history of wound on her left lower extremity. In addition she has been refusing to eat over the past 2-3 weeks. Patient has underlying chronic kidney disease. Her baseline creatinine appears to be 1.2. Upon presentation creatinine was 7.6 with a BUN of 93. With IV fluid hydration BUN is come down to 76 with a creatinine of 5.8. Serum bicarbonate today is 14.   Medications: Outpatient medications: Prescriptions Prior to Admission  Medication Sig Dispense Refill Last Dose  . aspirin 81 MG chewable tablet Chew 1 tablet (81 mg total) by mouth daily. 30 tablet 2 01/20/2016 at Unknown time  . atorvastatin (LIPITOR) 40 MG tablet Take 40 mg by mouth at bedtime.   01/19/2016 at Unknown time  . B Complex-C (SUPER B COMPLEX PO) Take 1 capsule by mouth daily.   01/20/2016 at Unknown time  . Biotin 1000 MCG tablet Take 1,000 mcg by mouth daily.   01/20/2016 at Unknown time  . calcium carbonate (OSCAL) 1500 (600 Ca) MG TABS tablet Take 600 mg of elemental calcium by mouth daily with breakfast.   01/20/2016 at Unknown time  . cyclobenzaprine (FLEXERIL) 10 MG tablet Take 10 mg by mouth 2 (two)  times daily as needed for muscle spasms.   prn at prn  . donepezil (ARICEPT) 10 MG tablet Take 10 mg by mouth at bedtime.   01/19/2016 at Unknown time  . ferrous sulfate 325 (65 FE) MG tablet Take 325 mg by mouth 3 (three) times daily with meals.   01/20/2016 at Unknown time  . folic acid (FOLVITE) 1 MG tablet Take 1 mg by mouth daily.   01/20/2016 at Unknown time  . Glucosamine-Chondroitin-MSM 750-400-375 MG TABS Take 1 tablet by mouth daily.   01/20/2016 at Unknown time  . lisinopril (PRINIVIL,ZESTRIL) 10 MG tablet Take 1 tablet (10 mg total) by mouth daily. 30 tablet 0 01/20/2016 at Unknown time  . loperamide (IMODIUM) 2 MG capsule Take 2 mg by mouth every 4 (four) hours as needed for diarrhea or loose stools.   prn at prn  . metoprolol tartrate (LOPRESSOR) 25 MG tablet Take 1 tablet (25 mg total) by mouth 2 (two) times daily. 60 tablet 0 01/20/2016 at 1700  . Multiple Vitamin (MULTIVITAMIN WITH MINERALS) TABS tablet Take 1 tablet by mouth daily.   01/20/2016 at Unknown time  . naproxen sodium (ANAPROX) 220 MG tablet Take 440 mg by mouth daily with breakfast.  01/20/2016 at Unknown time  . omega-3 acid ethyl esters (LOVAZA) 1 g capsule Take 2 g by mouth daily.   01/20/2016 at Unknown time  . oxyCODONE-acetaminophen (PERCOCET/ROXICET) 5-325 MG tablet Take 1 tablet by mouth every 4 (four) hours as needed for severe pain. 30 tablet 0 prn at prn  . promethazine (PHENERGAN) 25 MG tablet Take 25 mg by mouth every 6 (six) hours as needed for nausea or vomiting.   prn at prn  . senna-docusate (SENOKOT-S) 8.6-50 MG tablet Take 1 tablet by mouth at bedtime as needed for mild constipation. 30 tablet 1 prn at prn  . sodium chloride (OCEAN) 0.65 % SOLN nasal spray Place 1 spray into both nostrils daily.   01/20/2016 at Unknown time  . valproic acid (DEPAKENE) 250 MG capsule Take 250 mg by mouth 2 (two) times daily.   01/20/2016 at Unknown time  . vitamin C (ASCORBIC ACID) 500 MG tablet Take 500 mg by mouth 2  (two) times daily.   01/20/2016 at Unknown time    Current medications: Current Facility-Administered Medications  Medication Dose Route Frequency Provider Last Rate Last Dose  . 0.9 %  sodium chloride infusion   Intravenous Continuous Arnaldo Natal, MD 150 mL/hr at 01/21/16 0848    . acetaminophen (TYLENOL) tablet 650 mg  650 mg Oral Q6H PRN Oralia Manis, MD       Or  . acetaminophen (TYLENOL) suppository 650 mg  650 mg Rectal Q6H PRN Oralia Manis, MD      . ceFEPIme (MAXIPIME) 1 GM / 48mL IVPB premix  1 g Intravenous Q24H Oralia Manis, MD      . folic acid (FOLVITE) tablet 1 mg  1 mg Oral Daily Oralia Manis, MD      . heparin injection 5,000 Units  5,000 Units Subcutaneous Q8H Oralia Manis, MD      . LORazepam (ATIVAN) tablet 1 mg  1 mg Oral Q6H PRN Oralia Manis, MD       Or  . LORazepam (ATIVAN) injection 1 mg  1 mg Intravenous Q6H PRN Oralia Manis, MD      . LORazepam (ATIVAN) tablet 0-4 mg  0-4 mg Oral Q6H Oralia Manis, MD       Followed by  . [START ON 01/23/2016] LORazepam (ATIVAN) tablet 0-4 mg  0-4 mg Oral Q12H Oralia Manis, MD      . magic mouthwash w/lidocaine  5 mL Oral TID PRN Oralia Manis, MD      . multivitamin with minerals tablet 1 tablet  1 tablet Oral Daily Oralia Manis, MD      . ondansetron [facility]) tablet 4 mg  4 mg Oral Q6H PRN Oralia Manis, MD       Or  . ondansetron [facility]) injection 4 mg  4 mg Intravenous Q6H PRN Oralia Manis, MD      . sodium bicarbonate 150 mEq in dextrose 5 % 1,000 mL infusion   Intravenous Continuous Oralia Manis, MD 100 mL/hr at 01/21/16 0800    . sodium chloride flush (NS) 0.9 % injection 3 mL  3 mL Intravenous Q12H Oralia Manis, MD   3 mL at 01/21/16 0108  . thiamine (VITAMIN B-1) tablet 100 mg  100 mg Oral Daily Oralia Manis, MD       Or  . thiamine (B-1) injection 100 mg  100 mg Intravenous Daily Oralia Manis, MD          Allergies: Allergies  Allergen Reactions  . Levofloxacin Other (See Comments)  Reaction:  Unknown        Past Medical History: Past Medical History:  Diagnosis Date  . Alcohol abuse   . Alzheimer's disease   . Arthropathic psoriasis (HCC)   . Cerebral infarction (HCC)   . Dementia   . Difficulty walking   . Essential hypertension   . Hip pain, chronic, right   . Hyperlipidemia   . Hypertension   . Muscle wasting   . Osteomyelitis (HCC)   . RA (rheumatoid arthritis) (HCC)   . Raynaud's syndrome   . Rheumatoid arthritis (HCC)   . Vascular dementia without behavioral disturbance      Past Surgical History: Past Surgical History:  Procedure Laterality Date  . APPLICATION OF WOUND VAC Left 10/31/2015   Procedure: APPLICATION OF WOUND VAC;  Surgeon: Gwyneth Revels, DPM;  Location: [facility] ORS;  Service: Podiatry;  Laterality: Left;  . IRRIGATION AND DEBRIDEMENT FOOT Left 10/31/2015   Procedure: IRRIGATION AND DEBRIDEMENT FOOT;  Surgeon: Gwyneth Revels, DPM;  Location: [facility] ORS;  Service: Podiatry;  Laterality: Left;  . JOINT REPLACEMENT    . SPINE SURGERY       Family History: Family History  Problem Relation Age of Onset  . Diabetes Mother   . Heart disease Father      Social History: Social History   Social History  . Marital status: Widowed    Spouse name: N/A  . Number of children: N/A  . Years of education: N/A   Occupational History  . Not on file.   Social History Main Topics  . Smoking status: Never Smoker  . Smokeless tobacco: Never Used  . Alcohol use No  . Drug use: No  . Sexual activity: Not on file   Other Topics Concern  . Not on file   Social History Narrative  . No narrative on file     Review of Systems: Patient confused and unable to provide.  Vital Signs: Blood pressure (!) 90/59, pulse 94, temperature 98.6 F (37 C), temperature source Oral, resp. rate 16, height 5\' 3"  (1.6 m), weight 38.2 kg (84 lb 3.5 oz), SpO2 97 %.  Weight trends: Weights   01/20/16 1950 01/21/16 0000  Weight: 37.9 kg (83 lb 8 oz) 38.2 kg (84 lb 3.5  oz)    Physical Exam: General: Cachectic, disheveled   Head: Normocephalic, atraumatic.  Eyes: Anicteric, EOMI  Nose: Mucous membranes dry, not inflammed, nonerythematous.  Throat: Oropharynx nonerythematous, Oral mucosa very dry   Neck: Supple, trachea midline.  Lungs:  Normal respiratory effort. Clear to auscultation BL without crackles or wheezes.  Heart: S1S2 no rubs  Abdomen:  BS normoactive. Soft, Nondistended, non-tender.  No masses or organomegaly.  Extremities: Heels in support, no LE edema  Neurologic: Awake, very hard of hearing, confused  Skin: Skin breakdown on bilateral LE's    Lab results: Basic Metabolic Panel:  Recent Labs Lab 01/20/16 2012 01/21/16 0306 01/21/16 0714  NA 133* 134* 137  K 6.7* 5.0 4.4  CL 104 110 112*  CO2 <7* 10* 14*  GLUCOSE 68 156* 124*  BUN 93* 85* 76*  CREATININE 7.66* 6.81* 5.82*  CALCIUM 8.7* 7.7* 7.1*  MG 1.5*  --   --     Liver Function Tests:  Recent Labs Lab 01/20/16 2012  AST 17  ALT 14  ALKPHOS 107  BILITOT 1.6*  PROT 7.5  ALBUMIN 2.8*   No results for input(s): LIPASE, AMYLASE in the last 168 hours. No results for input(s): AMMONIA  in the last 168 hours.  CBC:  Recent Labs Lab 01/20/16 2012 01/21/16 0714  WBC 37.7* 15.1*  NEUTROABS 35.1*  --   HGB 11.3* 8.2*  HCT 36.2 25.2*  MCV 96.7 93.9  PLT 378 220    Cardiac Enzymes:  Recent Labs Lab 01/20/16 2012 01/21/16 0306  TROPONINI 0.07* 0.08*    BNP: Invalid input(s): POCBNP  CBG:  Recent Labs Lab 01/20/16 2338  GLUCAP 138*    Microbiology: Results for orders placed or performed during the hospital encounter of 01/20/16  Culture, blood (Routine X 2) w Reflex to ID Panel     Status: None (Preliminary result)   Collection Time: 01/20/16  9:49 PM  Result Value Ref Range Status   Specimen Description BLOOD LEFT FOREARM  Final   Special Requests BOTTLES DRAWN AEROBIC AND ANAEROBIC 9CCAERO,9CCANA  Final   Culture NO GROWTH < 12 HOURS   Final   Report Status PENDING  Incomplete  Culture, blood (Routine X 2) w Reflex to ID Panel     Status: None (Preliminary result)   Collection Time: 01/20/16  9:49 PM  Result Value Ref Range Status   Specimen Description BLOOD LEFT FOREARM  Final   Special Requests BOTTLES DRAWN AEROBIC AND ANAEROBIC [id number],5CCANA  Final   Culture NO GROWTH < 12 HOURS  Final   Report Status PENDING  Incomplete  MRSA PCR Screening     Status: None   Collection Time: 01/21/16 12:21 AM  Result Value Ref Range Status   MRSA by PCR NEGATIVE NEGATIVE Final    Comment:        The GeneXpert MRSA Assay (FDA approved for NASAL specimens only), is one component of a comprehensive MRSA colonization surveillance program. It is not intended to diagnose MRSA infection nor to guide or monitor treatment for MRSA infections.     Coagulation Studies: No results for input(s): LABPROT, INR in the last 72 hours.  Urinalysis:  Recent Labs  01/21/16 0057  COLORURINE AMBER*  LABSPEC 1.021  PHURINE 5.0  GLUCOSEU NEGATIVE  HGBUR NEGATIVE  BILIRUBINUR NEGATIVE  KETONESUR 5*  PROTEINUR 100*  NITRITE NEGATIVE  LEUKOCYTESUR MODERATE*      Imaging: Dg Chest 2 View  Result Date: 01/20/2016 CLINICAL DATA:  Failure to thrive. History of alcohol abuse, Alzheimer's disease, rheumatoid arthritis, osteomyelitis. EXAM: CHEST  2 VIEW COMPARISON:  Chest radiograph October 29, 2015. FINDINGS: Cardiomediastinal silhouette is unremarkable. Increased lung volumes without pleural effusion or focal consolidation. No pneumothorax. Interval removal of RIGHT PICC line, no retained foreign bodies. ACDF. Osteopenia. Chronic deformities of the bilateral shoulders. IMPRESSION: Hyperinflation, no acute cardiopulmonary process. Electronically Signed   By: Awilda Metro M.D.   On: 01/20/2016 23:34   Ct Head Wo Contrast  Result Date: 01/20/2016 CLINICAL DATA:  Failure to thrive. History of alcohol abuse, Alzheimer's disease,  rheumatoid arthritis, osteomyelitis. EXAM: CT HEAD WITHOUT CONTRAST TECHNIQUE: Contiguous axial images were obtained from the base of the skull through the vertex without intravenous contrast. COMPARISON:  MRI head Jun 08, 2015 FINDINGS: BRAIN: No intraparenchymal hemorrhage, mass effect, midline shift or acute large vascular territory infarcts. Old small cerebellar infarcts, old RIGHT temporal occipital lobe infarcts, old biparietal lobe infarcts. Patchy to confluent supratentorial white matter hypodensities. Old bilateral basal ganglia and LEFT thalamus lacunar infarcts. Moderate ventriculomegaly on the basis of global parenchymal brain volume loss. No abnormal extra-axial fluid collections. VASCULAR: Moderate calcific atherosclerosis of the carotid siphons. SKULL: No skull fracture. Moderate temporomandibular osteoarthrosis. No significant scalp soft  tissue swelling. SINUSES/ORBITS: The mastoid air-cells and included paranasal sinuses are well-aerated.The included ocular globes and orbital contents are non-suspicious. Severe included atlantodental osteoarthrosis.  None. IMPRESSION: No acute intracranial process. Multiple old lacunar infarcts and large vascular territory infarcts, unchanged. Moderate global brain atrophy and moderate to severe chronic small vessel ischemic disease. Electronically Signed   By: Awilda Metro M.D.   On: 01/20/2016 23:39      Assessment & Plan: Pt is a 60 y.o. female with a PMHx of Alcohol abuse, also tremors dementia, psoriasis, hypertension, hyperlipidemia, history of osteomyelitis, rheumatoid arthritis, Raynaud's syndrome, who was admitted to [facility] on 01/20/2016 for evaluation of failure to thrive.   1.  Acute renal failure due to poor PO intake, while on lisinopril. 2.  Hyperkalemia. 3.  Severe metabolic acidosis, normal lactic acid level 4.  CKD stage III baseline Cr 1.2 5.  Malnutrition with refusal to eat at nursing home. 6.  Underlying dementia.  Plan:  The  patient appears to be severely ill at this point in time. She has apparently not been eating or drinking very much over the past 2-3 weeks. She has hyperkalemia along with metabolic acidosis. Starvation may have been playing some role in this as well as acute renal failure. Continue bicarbonate infusion at this point in time. No urgent indication for dialysis as BUN and creatinine are improving. Potassium is also improved today. Continue to monitor renal function panel over the next several days. Avoid nephrotoxins as possible.

## 2016-01-21 NOTE — Progress Notes (Signed)
[facility] [place] at [facility]   PATIENT NAME: Ruth Price    MR#:  [id number]  DATE OF BIRTH:  [birth year]  SUBJECTIVE:  CHIEF COMPLAINT:  Patient is resting comfortably. Very hard of hearing, pleasant confusion intermittently but answering most of the questions appropriately  REVIEW OF SYSTEMS:  CONSTITUTIONAL: No fever, fatigue or weakness.  EYES: No blurred or double vision.  EARS, NOSE, AND THROAT: No tinnitus or ear pain.  RESPIRATORY: No cough, shortness of breath, wheezing or hemoptysis.  CARDIOVASCULAR: No chest pain, orthopnea, edema.  GASTROINTESTINAL: No nausea, vomiting, diarrhea or abdominal pain.  GENITOURINARY: No dysuria, hematuria.  ENDOCRINE: No polyuria, nocturia,  HEMATOLOGY: No anemia, easy bruising or bleeding SKIN: No rash or lesion. MUSCULOSKELETAL: No joint pain or arthritis.   NEUROLOGIC: No tingling, numbness, weakness.  PSYCHIATRY: No anxiety or depression.   DRUG ALLERGIES:   Allergies  Allergen Reactions  . Levofloxacin Other (See Comments)    Reaction:  Unknown     VITALS:  Blood pressure (!) 90/59, pulse 94, temperature 98.6 F (37 C), temperature source Oral, resp. rate 16, height 5\' 3"  (1.6 m), weight 38.2 kg (84 lb 3.5 oz), SpO2 97 %.  PHYSICAL EXAMINATION:  GENERAL:  60 y.o.-year-old patient lying in the bed with no acute distress.Emasciated EYES: Pupils equal, round, reactive to light and accommodation. No scleral icterus. Extraocular muscles intact.  HEENT: Head atraumatic, normocephalic. Oropharynx and nasopharynx clear.  NECK:  Supple, no jugular venous distention. No thyroid enlargement, no tenderness.  LUNGS: Normal breath sounds bilaterally, no wheezing, rales,rhonchi or crepitation. No use of accessory muscles of respiration.  CARDIOVASCULAR: S1, S2 normal. No murmurs, rubs, or gallops.  ABDOMEN: Soft, nontender, nondistended. Bowel sounds present. No organomegaly or mass.  EXTREMITIES: No pedal  edema, cyanosis, or clubbing.  NEUROLOGIC: Cranial nerves II through XII are intact. Muscle strength 5/5 in all extremities. Sensation intact. Gait not checked.  PSYCHIATRIC: The patient is alert and oriented x 3.  SKIN: Stage II sacral and bilateral healed decub ulcers No obvious rash,  LABORATORY PANEL:   CBC  Recent Labs Lab 01/21/16 0714  WBC 15.1*  HGB 8.2*  HCT 25.2*  PLT 220   ------------------------------------------------------------------------------------------------------------------  Chemistries   Recent Labs Lab 01/20/16 2012  01/21/16 0714  NA 133*  < > 137  K 6.7*  < > 4.4  CL 104  < > 112*  CO2 <7*  < > 14*  GLUCOSE 68  < > 124*  BUN 93*  < > 76*  CREATININE 7.66*  < > 5.82*  CALCIUM 8.7*  < > 7.1*  MG 1.5*  --   --   AST 17  --   --   ALT 14  --   --   ALKPHOS 107  --   --   BILITOT 1.6*  --   --   < > = values in this interval not displayed. ------------------------------------------------------------------------------------------------------------------  Cardiac Enzymes  Recent Labs Lab 01/21/16 0306  TROPONINI 0.08*   ------------------------------------------------------------------------------------------------------------------  RADIOLOGY:  Dg Chest 2 View  Result Date: 01/20/2016 CLINICAL DATA:  Failure to thrive. History of alcohol abuse, Alzheimer's disease, rheumatoid arthritis, osteomyelitis. EXAM: CHEST  2 VIEW COMPARISON:  Chest radiograph October 29, 2015. FINDINGS: Cardiomediastinal silhouette is unremarkable. Increased lung volumes without pleural effusion or focal consolidation. No pneumothorax. Interval removal of RIGHT PICC line, no retained foreign bodies. ACDF. Osteopenia. Chronic deformities of the bilateral shoulders. IMPRESSION: Hyperinflation, no acute cardiopulmonary process. Electronically Signed  By: Awilda Metro M.D.   On: 01/20/2016 23:34   Ct Head Wo Contrast  Result Date: 01/20/2016 CLINICAL DATA:   Failure to thrive. History of alcohol abuse, Alzheimer's disease, rheumatoid arthritis, osteomyelitis. EXAM: CT HEAD WITHOUT CONTRAST TECHNIQUE: Contiguous axial images were obtained from the base of the skull through the vertex without intravenous contrast. COMPARISON:  MRI head Jun 08, 2015 FINDINGS: BRAIN: No intraparenchymal hemorrhage, mass effect, midline shift or acute large vascular territory infarcts. Old small cerebellar infarcts, old RIGHT temporal occipital lobe infarcts, old biparietal lobe infarcts. Patchy to confluent supratentorial white matter hypodensities. Old bilateral basal ganglia and LEFT thalamus lacunar infarcts. Moderate ventriculomegaly on the basis of global parenchymal brain volume loss. No abnormal extra-axial fluid collections. VASCULAR: Moderate calcific atherosclerosis of the carotid siphons. SKULL: No skull fracture. Moderate temporomandibular osteoarthrosis. No significant scalp soft tissue swelling. SINUSES/ORBITS: The mastoid air-cells and included paranasal sinuses are well-aerated.The included ocular globes and orbital contents are non-suspicious. Severe included atlantodental osteoarthrosis.  None. IMPRESSION: No acute intracranial process. Multiple old lacunar infarcts and large vascular territory infarcts, unchanged. Moderate global brain atrophy and moderate to severe chronic small vessel ischemic disease. Electronically Signed   By: Awilda Metro M.D.   On: 01/20/2016 23:39    EKG:   Orders placed or performed during the hospital encounter of 01/20/16  . EKG 12-Lead  . EKG 12-Lead    ASSESSMENT AND PLAN:  Patient is brought in from a nursing facility with low blood pressure and hyperkalemia with T wave changes. Very hard of hearing  #Septic shock (HCC) -meets septic criteria with hypotension and leukocytosis Continue IV antibiotics cefepime and vancomycin Continue IV fluids-on bicarbonate drip  Pending blood and urine cultures patient is hemodynamically  stable, lactic acid was within normal limits,      #AKI (acute kidney injury) (HCC) - poor by mouth intake-prerenal leading to ATN Continue IV fluids with bicarbonate Renal function is slightly better Baseline 1.03 in September 2017; 7.66-6.81 Follow-up with nephrology  #Hyperkalemia -  With T-wave changes in the time of arrival resolved and now. Patient has received insulin and D50, albuterol in the ED, bicarbonate drip as above  #  Protein-calorie malnutrition, severe -  nutrition consult, as well as psychiatry consult to try and assess why she has been refusing to eat.   #Sacral DQ ulcers and bilateral heel ulcers  Reposition patient every 2 hours and wound care   # HTN (hypertension) - currently blood pressure is low  we will hold antihypertensives for now  IV fluids as above   # Alzheimer's dementia - we will hold her meds    # Alcohol abuse -   the family is suspicious she may be getting alcohol from her boyfriend. on CIWA protocol.   Discussed with RN and nephrology Transfer patient to the floor as she is hemodynamically stable All the records are reviewed and case discussed with Care Management/Social Workerr. Management plans discussed with the patient, family and they are in agreement.  CODE STATUS: dnr  TOTAL TIME TAKING CARE OF THIS PATIENT: 39 minutes.   POSSIBLE D/C IN 3 DAYS, DEPENDING ON CLINICAL CONDITION.  Note: This dictation was prepared with Dragon dictation along with smaller phrase technology. Any transcriptional errors that result from this process are unintentional.   Ramonita Lab M.D on 01/21/2016 at 9:00 AM  Between 7am to 6pm - Pager - [phone] After 6pm go to [email or web address] - Scientist, research (life sciences) Hospitalists  Office  [phone]  CC: Primary care physician; Jonathon Bellows, NP

## 2016-01-21 NOTE — Progress Notes (Addendum)
Initial Nutrition Assessment  DOCUMENTATION CODES:   Severe malnutrition in context of chronic illness  INTERVENTION:  1. If family wants aggressive care, recommend begin Enteral Nutrition Support, otherwise recommend patient should undergo SLP-eval and recommend palliative/comfort route.  NUTRITION DIAGNOSIS:   Malnutrition related to chronic illness as evidenced by severe depletion of muscle mass, severe depletion of body fat, energy intake < or equal to 50% for > or equal to 5 days  GOAL:   Patient will meet greater than or equal to 90% of their needs  MONITOR:   PO intake, I & O's, Labs, Weight trends, Supplement acceptance  REASON FOR ASSESSMENT:   Consult, Malnutrition Screening Tool Assessment of nutrition requirement/status  ASSESSMENT:   Ruth Price  is a 60 y.o. female who presents with Significant electrolyte derangements and failure to thrive. Patient presents from nursing facility where she was noted to have low blood pressure.  Attempted to speak with patient at bedside. Noted she has been refusing food for 2-3 weeks. When I asked her about this she seemed confused, states she can eat if she is sat "upright" and that the food here is terrible - but patient has not had a diet since she has been here. She may be referring to the nursing facility she is at - still would not explain why she is not eating. She then asked if she could get water - and could not continue to speak if she did not receive water. Patient is also very hard of hearing.  Per chart wt is down a severe 17#/17% over 7 months.  With failure to thrive and patient refusing food x2-3 weeks - if family wants aggressive care recommend patient receive a PEG tube.  Nutrition-Focused physical exam completed. Findings are severe fat depletion, severe muscle depletion, and no edema.    Labs and medications reviewed: Mg 1.5, Tot Bili 1.6 NS @ 150mL/hr NaHCO @ 182mL/hr --> 408 calories  Diet Order:   Diet NPO time specified  Skin:  Wound (see comment) (Stg II To Buttocks, Stg III to ankle)  Last BM:  12/18  Height:   Ht Readings from Last 1 Encounters:  01/21/16 5\' 3"  (1.6 m)    Weight:   Wt Readings from Last 1 Encounters:  01/21/16 84 lb 3.5 oz (38.2 kg)    Ideal Body Weight:  52.27 kg  BMI:  Body mass index is 14.92 kg/m.  Estimated Nutritional Needs:   Kcal:  1000-1200 calories (MSJ x1.1-1.3)  Protein:  45-57 gm  Fluid:  >/= 1L  EDUCATION NEEDS:   No education needs identified at this time  01/23/16. Prestina Raigoza, MS, RD LDN Inpatient Clinical Dietitian Pager [phone]

## 2016-01-21 NOTE — Consult Note (Addendum)
WOC Nurse wound consult note Reason for Consult: Consult requested for left ankle.  Pt has been followed in the past by podiatry for surgery in September and had a history of osteomyelitis, according to the EMR.  She had a Vac applied which has been discontinued at some point when the wound apparently improved. Consult performed by remote camera with bedside nurse assistance for assessment and measurements. Wound type: Chronic full thickness wound Measurement: 5X2X.3cm Wound bed: 70% dry red wound bed, 20% yellow dry woundbed with a flap over the wound, 10% moist yellow  Drainage (amount, consistency, odor) Scant amt yellow drainage, no odor Periwound: Intact skin surrounding Dressing procedure/placement/frequency: Bactroban to promote moist healing and provide antimicrobial benefits. Foam dressing to protect from further injury. No family present to discuss plan of care. Topical treatment orders provided for staff nurse. Please re-consult if further assistance is needed.  Thank-you,  Cammie Mcgee MSN, RN, CWOCN, [place], CNS [phone]

## 2016-01-21 NOTE — Progress Notes (Signed)
Palliative Medicine consult noted. Due to high referral volume, there may be a delay seeing this patient. Please call the Palliative Medicine Team office at [phone] if recommendations are needed in the interim.  Thank you for inviting [place] to see this patient.  Margret Chance Jaidah Lomax, RN, BSN, [facility] 01/21/2016 10:05 AM Cell [phone] 8:00-4:00 Monday-Friday Office [phone]

## 2016-01-21 NOTE — Progress Notes (Signed)
ANTIBIOTIC CONSULT NOTE - INITIAL  Pharmacy Consult for Cefepime, Vancomycin Indication: Sepsis  Allergies  Allergen Reactions  . Levofloxacin Other (See Comments)    Reaction:  Unknown     Patient Measurements: Weight: 83 lb 8 oz (37.9 kg) Adjusted Body Weight: 49.4 kg   Vital Signs: Temp: 97.1 F (36.2 C) (12/17 2000) Temp Source: Oral (12/17 2000) BP: 104/60 (12/17 2230) Pulse Rate: 94 (12/17 2030) Intake/Output from previous day: 12/17 0701 - 12/18 0700 In: 50 [IV Piggyback:50] Out: -  Intake/Output from this shift: Total I/O In: 50 [IV Piggyback:50] Out: -   Labs:  Recent Labs  01/20/16 2012  WBC 37.7*  HGB 11.3*  PLT 378  CREATININE 7.66*   Estimated Creatinine Clearance: 4.6 mL/min (by C-G formula based on SCr of 7.66 mg/dL (H)). No results for input(s): VANCOTROUGH, VANCOPEAK, VANCORANDOM, GENTTROUGH, GENTPEAK, GENTRANDOM, TOBRATROUGH, TOBRAPEAK, TOBRARND, AMIKACINPEAK, AMIKACINTROU, AMIKACIN in the last 72 hours.   Microbiology: No results found for this or any previous visit (from the past 720 hour(s)).  Medical History: Past Medical History:  Diagnosis Date  . Alcohol abuse   . Alzheimer's disease   . Arthropathic psoriasis (HCC)   . Cerebral infarction (HCC)   . Dementia   . Difficulty walking   . Essential hypertension   . Hip pain, chronic, right   . Hyperlipidemia   . Hypertension   . Muscle wasting   . Osteomyelitis (HCC)   . RA (rheumatoid arthritis) (HCC)   . Raynaud's syndrome   . Rheumatoid arthritis (HCC)   . Vascular dementia without behavioral disturbance     Medications:  Prescriptions Prior to Admission  Medication Sig Dispense Refill Last Dose  . aspirin 81 MG chewable tablet Chew 1 tablet (81 mg total) by mouth daily. 30 tablet 2 01/20/2016 at Unknown time  . atorvastatin (LIPITOR) 40 MG tablet Take 40 mg by mouth at bedtime.   01/19/2016 at Unknown time  . B Complex-C (SUPER B COMPLEX PO) Take 1 capsule by mouth  daily.   01/20/2016 at Unknown time  . Biotin 1000 MCG tablet Take 1,000 mcg by mouth daily.   01/20/2016 at Unknown time  . calcium carbonate (OSCAL) 1500 (600 Ca) MG TABS tablet Take 600 mg of elemental calcium by mouth daily with breakfast.   01/20/2016 at Unknown time  . cyclobenzaprine (FLEXERIL) 10 MG tablet Take 10 mg by mouth 2 (two) times daily as needed for muscle spasms.   prn at prn  . donepezil (ARICEPT) 10 MG tablet Take 10 mg by mouth at bedtime.   01/19/2016 at Unknown time  . ferrous sulfate 325 (65 FE) MG tablet Take 325 mg by mouth 3 (three) times daily with meals.   01/20/2016 at Unknown time  . folic acid (FOLVITE) 1 MG tablet Take 1 mg by mouth daily.   01/20/2016 at Unknown time  . Glucosamine-Chondroitin-MSM 750-400-375 MG TABS Take 1 tablet by mouth daily.   01/20/2016 at Unknown time  . lisinopril (PRINIVIL,ZESTRIL) 10 MG tablet Take 1 tablet (10 mg total) by mouth daily. 30 tablet 0 01/20/2016 at Unknown time  . loperamide (IMODIUM) 2 MG capsule Take 2 mg by mouth every 4 (four) hours as needed for diarrhea or loose stools.   prn at prn  . metoprolol tartrate (LOPRESSOR) 25 MG tablet Take 1 tablet (25 mg total) by mouth 2 (two) times daily. 60 tablet 0 01/20/2016 at 1700  . Multiple Vitamin (MULTIVITAMIN WITH MINERALS) TABS tablet Take 1 tablet by mouth  daily.   01/20/2016 at Unknown time  . naproxen sodium (ANAPROX) 220 MG tablet Take 440 mg by mouth daily with breakfast.   01/20/2016 at Unknown time  . omega-3 acid ethyl esters (LOVAZA) 1 g capsule Take 2 g by mouth daily.   01/20/2016 at Unknown time  . oxyCODONE-acetaminophen (PERCOCET/ROXICET) 5-325 MG tablet Take 1 tablet by mouth every 4 (four) hours as needed for severe pain. 30 tablet 0 prn at prn  . promethazine (PHENERGAN) 25 MG tablet Take 25 mg by mouth every 6 (six) hours as needed for nausea or vomiting.   prn at prn  . senna-docusate (SENOKOT-S) 8.6-50 MG tablet Take 1 tablet by mouth at bedtime as needed  for mild constipation. 30 tablet 1 prn at prn  . sodium chloride (OCEAN) 0.65 % SOLN nasal spray Place 1 spray into both nostrils daily.   01/20/2016 at Unknown time  . valproic acid (DEPAKENE) 250 MG capsule Take 250 mg by mouth 2 (two) times daily.   01/20/2016 at Unknown time  . vitamin C (ASCORBIC ACID) 500 MG tablet Take 500 mg by mouth 2 (two) times daily.   01/20/2016 at Unknown time   Assessment: Pharmacy consulted to dose vanc and cefepime in this 60 year old female presenting with AKI, muliple electrolyte abnormalities, sepsis.    CrCl = 4.6 ml/min Ke = 0.008 hr-1 T1/2 = 86.6 hrs Vd = 26.5 L   Goal of Therapy:  Vancomycin trough level 15-20 mcg/ml  Plan:  Expected duration 10 days with resolution of temperature and/or normalization of WBC   Cefepime 2 gm IV X 1 given in ED on 12/17 @ 22.43. Will order cefepime 1 gm IV Q24H to start 12/18 @ 18:00.  Vancomycin 500 mg IV X 1 ordered to be given 12/18 @ ~ 1:00. Based on current PK parameters, next dose will not be due until 12/22.  This pt's baseline SrCr appears to be ~ 1.23 (10/31/15).  Would wait to see if CrCl improves before scheduling further doses or troughs.   [name] D 01/21/2016,12:55 AM

## 2016-01-21 NOTE — NC FL2 (Signed)
[place] MEDICAID FL2 LEVEL OF CARE SCREENING TOOL     IDENTIFICATION  Patient Name: Ruth Price Birthdate: 1955/05/06 Sex: female Admission Date (Current Location): 01/20/2016  [place] and [place] Number:  Chiropodist and Address:  [facility], [address], [place], [place] [address]      Provider Number: [id number]  Attending Physician Name and Address:  Ramonita Lab, MD  Relative Name and Phone Number:       Current Level of Care: Hospital Recommended Level of Care: Skilled Nursing Facility Prior Approval Number:    Date Approved/Denied:   PASRR Number: [id number]  Discharge Plan: SNF    Current Diagnoses: Patient Active Problem List   Diagnosis Date Noted  . Pressure injury of skin 01/21/2016  . Septic shock (HCC) 01/20/2016  . AKI (acute kidney injury) (HCC) 01/20/2016  . HTN (hypertension) 01/20/2016  . RA (rheumatoid arthritis) (HCC) 01/20/2016  . HLD (hyperlipidemia) 01/20/2016  . Severe sepsis (HCC) 01/20/2016  . Hyperkalemia 01/20/2016  . Protein-calorie malnutrition, severe 10/30/2015  . Alzheimer's dementia 10/27/2015  . Alcohol abuse 10/27/2015  . Osteomyelitis of ankle (HCC) 10/27/2015  . CVA (cerebral infarction) 06/08/2015    Orientation RESPIRATION BLADDER Height & Weight     Self  Normal Continent Weight: 84 lb 3.5 oz (38.2 kg) Height:  5\' 3"  (160 cm)  BEHAVIORAL SYMPTOMS/MOOD NEUROLOGICAL BOWEL NUTRITION STATUS   (none)  (none) Incontinent Diet (currently npo)  AMBULATORY STATUS COMMUNICATION OF NEEDS Skin   Extensive Assist   PU Stage and Appropriate Care                       Personal Care Assistance Level of Assistance  Bathing, Dressing Bathing Assistance: Maximum assistance   Dressing Assistance: Maximum assistance     Functional Limitations Info             SPECIAL CARE FACTORS FREQUENCY  PT (By licensed PT)                    Contractures Contractures Info: Not  present    Additional Factors Info  Code Status, Allergies, Isolation Precautions Code Status Info: dnr Allergies Info: levofloxacin     Isolation Precautions Info: mrsa     Current Medications (01/21/2016):  This is the current hospital active medication list Current Facility-Administered Medications  Medication Dose Route Frequency Provider Last Rate Last Dose  . 0.9 %  sodium chloride infusion   Intravenous Continuous 01/23/2016, MD 150 mL/hr at 01/21/16 0848    . acetaminophen (TYLENOL) tablet 650 mg  650 mg Oral Q6H PRN 01/23/16, MD       Or  . acetaminophen (TYLENOL) suppository 650 mg  650 mg Rectal Q6H PRN Oralia Manis, MD      . ceFEPIme (MAXIPIME) 1 GM / 67mL IVPB premix  1 g Intravenous Q24H 45m, MD      . folic acid (FOLVITE) tablet 1 mg  1 mg Oral Daily Oralia Manis, MD      . heparin injection 5,000 Units  5,000 Units Subcutaneous Q8H Oralia Manis, MD   5,000 Units at 01/21/16 1338  . LORazepam (ATIVAN) tablet 1 mg  1 mg Oral Q6H PRN 01/23/16, MD       Or  . LORazepam (ATIVAN) injection 1 mg  1 mg Intravenous Q6H PRN Oralia Manis, MD      . LORazepam (ATIVAN) tablet 0-4 mg  0-4 mg Oral Q6H  Oralia Manis, MD       Followed by  . [START ON 01/23/2016] LORazepam (ATIVAN) tablet 0-4 mg  0-4 mg Oral Q12H Oralia Manis, MD      . magic mouthwash w/lidocaine  5 mL Oral TID PRN Oralia Manis, MD      . multivitamin with minerals tablet 1 tablet  1 tablet Oral Daily Oralia Manis, MD      . mupirocin cream (BACTROBAN) 2 %   Topical Daily Ramonita Lab, MD      . ondansetron [facility]) tablet 4 mg  4 mg Oral Q6H PRN Oralia Manis, MD       Or  . ondansetron [facility]) injection 4 mg  4 mg Intravenous Q6H PRN Oralia Manis, MD      . sodium bicarbonate 150 mEq in dextrose 5 % 1,000 mL infusion   Intravenous Continuous Oralia Manis, MD 100 mL/hr at 01/21/16 1206    . sodium chloride flush (NS) 0.9 % injection 3 mL  3 mL Intravenous Q12H Oralia Manis, MD   3 mL at  01/21/16 0108  . thiamine (VITAMIN B-1) tablet 100 mg  100 mg Oral Daily Oralia Manis, MD       Or  . thiamine (B-1) injection 100 mg  100 mg Intravenous Daily Oralia Manis, MD         Discharge Medications: Please see discharge summary for a list of discharge medications.  Relevant Imaging Results:  Relevant Lab Results:   Additional Information    York Spaniel, LCSW

## 2016-01-21 NOTE — Consult Note (Signed)
WOC Nurse wound consult note Reason for Consult:Patient reconsulted for full thickness ulcer on sacrum complicated by incontinence associated dermatitis (IAD) and right lateral malleolus. Patient is incontinence of urine and is wearing disposable briefs.I am assisted in my assessment today by a Nursing Technician.  There is no family in the room.  Patient is noted to be heard of hearing. Wound type:Pressure (right lateral malleolus) and pressure in combination with moisture (sacrum) Pressure Ulcer POA: Yes Measurement: Sacral area measures 12cm x 10cm of erythema with scattered (>8 areas of partial thickness tissue loss (the largest of which measures 2cm x 1.5cm x 0.2cm); in the center of this erythematous area is a 6cm x 1cm area of black adherent eschar.  The areas of erythema blanch, but patient reports pain in the entire area. There is a scant amount of light yellow exudate on the old dressing. Right lateral malleolus: 2cm x 1cm x 0.2cm partial thickness, pink wound bed, scant moisture (serous) Wound bed:As described above Drainage (amount, consistency, odor) As described above Periwound: Intact, dry Dressing procedure/placement/frequency: I will today provide this patient with a therapeutic mattress replacement with low air loss feature and have provided Nursing with guidance for turning and repositioning to avoid the supine position. The right lateral malleolus is significantly less of a concern than the right (my partner consulted on that ulcer a few hours ago); that said, silicone foam dressings have been ordered to be used in conjunction with the off loading Prevalon pressure redistribution heel boots.  WOC nursing team will not follow, but will remain available to this patient, the nursing and medical teams.  Please re-consult if needed. Thanks, Ladona Mow, MSN, RN, GNP, [place]  Pager# [phone]

## 2016-01-21 NOTE — Progress Notes (Signed)
Patient transferred to rm [id number], report given to Darl Pikes, Charity fundraiser. Elink and CCMD notified, patient placed on telemetry before leaving 2A, nurse present. Another wound consult placed for sacrum since it was not addressed in original consult order. Patient updated on plan of care.  Trudee Kuster

## 2016-01-22 ENCOUNTER — Inpatient Hospital Stay: Payer: Medicare HMO

## 2016-01-22 LAB — URINE CULTURE: Culture: NO GROWTH

## 2016-01-22 LAB — C DIFFICILE QUICK SCREEN W PCR REFLEX
C DIFFICILE (CDIFF) INTERP: DETECTED
C DIFFICLE (CDIFF) ANTIGEN: POSITIVE — AB
C Diff toxin: POSITIVE — AB

## 2016-01-22 LAB — PROTEIN / CREATININE RATIO, URINE
Creatinine, Urine: 133 mg/dL
PROTEIN CREATININE RATIO: 0.65 mg/mg{creat} — AB (ref 0.00–0.15)
TOTAL PROTEIN, URINE: 86 mg/dL

## 2016-01-22 MED ORDER — VANCOMYCIN 50 MG/ML ORAL SOLUTION
500.0000 mg | Freq: Four times a day (QID) | ORAL | Status: DC
Start: 2016-01-23 — End: 2016-01-22
  Filled 2016-01-22 (×2): qty 10

## 2016-01-22 MED ORDER — VANCOMYCIN 50 MG/ML ORAL SOLUTION
125.0000 mg | Freq: Four times a day (QID) | ORAL | Status: DC
  Administered 2016-01-23 – 2016-01-24 (×7): 125 mg via ORAL
  Filled 2016-01-22 (×8): qty 2.5

## 2016-01-22 NOTE — Progress Notes (Signed)
This NP spoke with son via telephone to arrange a palliative meeting. Son not able to be at the hospital till after 5pm because of work. Attempted to discuss GOC on the phone but Jomarie Longs unable to talk. Encouraged Jomarie Longs to call me back this afternoon but unfortunately he did not return my call. PMT will attempt to discuss goals of care with son, Jomarie Longs tomorrow, 12/20. Thank you for involving the palliative medicine team in the care of this patient.    NO CHARGE  Vennie Homans, FNP-C Palliative Medicine Team  Phone: [phone] Fax: [phone]

## 2016-01-22 NOTE — Progress Notes (Signed)
[facility] - [place] at [facility]   PATIENT NAME: Ruth Price    MR#:  [id number]  DATE OF BIRTH:  01/01/1956  SUBJECTIVE:  CHIEF COMPLAINT:  Patient is Very hard of hearing, pleasant confusion intermittently but answering most of the questions appropriately.Reporting some lower abdominal pain  REVIEW OF SYSTEMS:  CONSTITUTIONAL: No fever, fatigue or weakness.  EYES: No blurred or double vision.  EARS, NOSE, AND THROAT: No tinnitus or ear pain.  RESPIRATORY: No cough, shortness of breath, wheezing or hemoptysis.  CARDIOVASCULAR: No chest pain, orthopnea, edema.  GASTROINTESTINAL: No nausea, vomiting, diarrhea or abdominal pain.  GENITOURINARY: No dysuria, hematuria.  ENDOCRINE: No polyuria, nocturia,  HEMATOLOGY: No anemia, easy bruising or bleeding SKIN: No rash or lesion. MUSCULOSKELETAL: No joint pain or arthritis.   NEUROLOGIC: No tingling, numbness, weakness.  PSYCHIATRY: No anxiety or depression.   DRUG ALLERGIES:   Allergies  Allergen Reactions  . Levofloxacin Other (See Comments)    Reaction:  Unknown     VITALS:  Blood pressure (!) 120/57, pulse 85, temperature 98.7 F (37.1 C), temperature source Oral, resp. rate 18, height 5\' 3"  (1.6 m), weight 38.2 kg (84 lb 3.5 oz), SpO2 (!) 77 %.  PHYSICAL EXAMINATION:  GENERAL:  60 y.o.-year-old patient lying in the bed with no acute distress.Emasciated EYES: Pupils equal, round, reactive to light and accommodation. No scleral icterus. Extraocular muscles intact.  HEENT: Head atraumatic, normocephalic. Oropharynx and nasopharynx clear.  NECK:  Supple, no jugular venous distention. No thyroid enlargement, no tenderness.  LUNGS: Normal breath sounds bilaterally, no wheezing, rales,rhonchi or crepitation. No use of accessory muscles of respiration.  CARDIOVASCULAR: S1, S2 normal. No murmurs, rubs, or gallops.  ABDOMEN: Soft, nontender, nondistended. Bowel sounds present. No organomegaly or mass.   EXTREMITIES: No pedal edema, cyanosis, or clubbing.  NEUROLOGIC: Cranial nerves II through XII are intact. Muscle strength 5/5 in all extremities. Sensation intact. Gait not checked.  PSYCHIATRIC: The patient is alert and oriented x 3.  SKIN: Stage II sacral and bilateral healed decub ulcers No obvious rash,  LABORATORY PANEL:   CBC  Recent Labs Lab 01/21/16 0714  WBC 15.1*  HGB 8.2*  HCT 25.2*  PLT 220   ------------------------------------------------------------------------------------------------------------------  Chemistries   Recent Labs Lab 01/20/16 2012  01/21/16 1800  NA 133*  < > 138  K 6.7*  < > 3.7  CL 104  < > 110  CO2 <7*  < > 19*  GLUCOSE 68  < > 127*  BUN 93*  < > 69*  CREATININE 7.66*  < > 4.48*  CALCIUM 8.7*  < > 6.7*  MG 1.5*  --   --   AST 17  --   --   ALT 14  --   --   ALKPHOS 107  --   --   BILITOT 1.6*  --   --   < > = values in this interval not displayed. ------------------------------------------------------------------------------------------------------------------  Cardiac Enzymes  Recent Labs Lab 01/21/16 1407  TROPONINI 0.03*   ------------------------------------------------------------------------------------------------------------------  RADIOLOGY:  Dg Chest 2 View  Result Date: 01/20/2016 CLINICAL DATA:  Failure to thrive. History of alcohol abuse, Alzheimer's disease, rheumatoid arthritis, osteomyelitis. EXAM: CHEST  2 VIEW COMPARISON:  Chest radiograph October 29, 2015. FINDINGS: Cardiomediastinal silhouette is unremarkable. Increased lung volumes without pleural effusion or focal consolidation. No pneumothorax. Interval removal of RIGHT PICC line, no retained foreign bodies. ACDF. Osteopenia. Chronic deformities of the bilateral shoulders. IMPRESSION: Hyperinflation, no acute cardiopulmonary process.  Electronically Signed   By: Awilda Metro M.D.   On: 01/20/2016 23:34   Ct Head Wo Contrast  Result Date:  01/20/2016 CLINICAL DATA:  Failure to thrive. History of alcohol abuse, Alzheimer's disease, rheumatoid arthritis, osteomyelitis. EXAM: CT HEAD WITHOUT CONTRAST TECHNIQUE: Contiguous axial images were obtained from the base of the skull through the vertex without intravenous contrast. COMPARISON:  MRI head Jun 08, 2015 FINDINGS: BRAIN: No intraparenchymal hemorrhage, mass effect, midline shift or acute large vascular territory infarcts. Old small cerebellar infarcts, old RIGHT temporal occipital lobe infarcts, old biparietal lobe infarcts. Patchy to confluent supratentorial white matter hypodensities. Old bilateral basal ganglia and LEFT thalamus lacunar infarcts. Moderate ventriculomegaly on the basis of global parenchymal brain volume loss. No abnormal extra-axial fluid collections. VASCULAR: Moderate calcific atherosclerosis of the carotid siphons. SKULL: No skull fracture. Moderate temporomandibular osteoarthrosis. No significant scalp soft tissue swelling. SINUSES/ORBITS: The mastoid air-cells and included paranasal sinuses are well-aerated.The included ocular globes and orbital contents are non-suspicious. Severe included atlantodental osteoarthrosis.  None. IMPRESSION: No acute intracranial process. Multiple old lacunar infarcts and large vascular territory infarcts, unchanged. Moderate global brain atrophy and moderate to severe chronic small vessel ischemic disease. Electronically Signed   By: Awilda Metro M.D.   On: 01/20/2016 23:39   US Renal  Result Date: 01/22/2016 CLINICAL DATA:  Acute renal failure. EXAM: RENAL / URINARY TRACT ULTRASOUND COMPLETE COMPARISON:  None. FINDINGS: Right Kidney: Length: 9.1 cm. Increased echogenicity consistent with chronic medical renal disease. No mass or hydronephrosis. Left Kidney: Length: 10.2 cm. Increased echogenicity consistent chronic medical renal disease. No mass or hydronephrosis. 7 mm shadowing nonobstructing renal calyceal stones are noted. Bladder:  Foley catheter noted.  Trace ascites. IMPRESSION: 1.  Increased echogenicity consistent chronic medical renal disease. 2. Nonobstructing left renal calyceal stones. No evidence of hydronephrosis. Bladder is nondistended. Foley catheter present bladder. 3. Trace ascites. Electronically Signed   By: Maisie Fus  Register   On: 01/22/2016 11:17    EKG:   Orders placed or performed during the hospital encounter of 01/20/16  . EKG 12-Lead  . EKG 12-Lead    ASSESSMENT AND PLAN:  Patient is brought in from a nursing facility with low blood pressure and hyperkalemia with T wave changes. Very hard of hearing  #Septic shock (HCC) -meets septic criteria with hypotension and leukocytosis Started her on IV antibiotics cefepime and vancomycin. Discontinue vancomycin as MRSA by PCR is negative Continue IV fluids-on bicarbonate drip   blood cultures no growth in 2 days Urine cultures-No growth patient is hemodynamically stable, lactic acid was within normal limits,      #AKI (acute kidney injury) (HCC) - poor by mouth intake-prerenal leading to ATN Continue IV fluids  Renal function is slightly better Baseline 1.03 in September 2017; 7.66-6.81-4.4 Renal ultrasound is normal. Foley catheter   appreciate nephrology recommendations  #Hyperkalemia -  resolved With T-wave changes in the time of arrival resolved and now. Patient has received insulin and D50, albuterol in the ED, bicarbonate drip as above  #  Protein-calorie malnutrition, severe -  nutrition consult, as well as psychiatry consult to try and assess why she has been refusing to eat.   #Sacral DQ ulcers and bilateral heel ulcers  Reposition patient every 2 hours and wound care   # HTN (hypertension) - currently blood pressure is low  we will hold antihypertensives for now  IV fluids as above   # Alzheimer's dementia - we will hold her meds    # Alcohol  abuse -   the family is suspicious she may be getting alcohol from her boyfriend. on  CIWA protocol.  family wants to meet with palliative care , palliative care is following with them   Discussed with RN and nephrology Transfer patient to the floor as she is hemodynamically stable All the records are reviewed and case discussed with Care Management/Social Workerr. Management plans discussed with the patient, son  and they are in agreement.  CODE STATUS: dnr  TOTAL TIME TAKING CARE OF THIS PATIENT: 39 minutes.   POSSIBLE D/C IN 2 DAYS, DEPENDING ON CLINICAL CONDITION.  Note: This dictation was prepared with Dragon dictation along with smaller phrase technology. Any transcriptional errors that result from this process are unintentional.   Ramonita Lab M.D on 01/22/2016 at 5:07 PM  Between 7am to 6pm - Pager - [phone] After 6pm go to [email or web address] - password [email or web address] [facility]  [place] [facility]  Office  [phone]  CC: Primary care physician; Jonathon Bellows, NP

## 2016-01-22 NOTE — Evaluation (Signed)
Clinical/Bedside Swallow Evaluation Patient Details  Name: Ruth Price MRN: [id number] Date of Birth: 06/22/1955  Today's Date: 01/22/2016 Time: SLP Start Time (ACUTE ONLY): 1100 SLP Stop Time (ACUTE ONLY): 1145 SLP Time Calculation (min) (ACUTE ONLY): 45 min  Past Medical History:  Past Medical History:  Diagnosis Date  . Alcohol abuse   . Alzheimer's disease   . Arthropathic psoriasis (HCC)   . Cerebral infarction (HCC)   . Dementia   . Difficulty walking   . Essential hypertension   . Hip pain, chronic, right   . Hyperlipidemia   . Hypertension   . Muscle wasting   . Osteomyelitis (HCC)   . RA (rheumatoid arthritis) (HCC)   . Raynaud's syndrome   . Rheumatoid arthritis (HCC)   . Vascular dementia without behavioral disturbance    Past Surgical History:  Past Surgical History:  Procedure Laterality Date  . APPLICATION OF WOUND VAC Left 10/31/2015   Procedure: APPLICATION OF WOUND VAC;  Surgeon: Gwyneth Revels, DPM;  Location: [facility] ORS;  Service: Podiatry;  Laterality: Left;  . IRRIGATION AND DEBRIDEMENT FOOT Left 10/31/2015   Procedure: IRRIGATION AND DEBRIDEMENT FOOT;  Surgeon: Gwyneth Revels, DPM;  Location: [facility] ORS;  Service: Podiatry;  Laterality: Left;  . JOINT REPLACEMENT    . SPINE SURGERY     HPI:  LisaSquilliniis a 60 y.o.femalewho presents with Significant electrolyte derangements and failure to thrive. Patient presents from nursing facility where she was noted to have low blood pressure. Here in the ED she was found to have severe electrolyte derangements including hyperkalemia with T-wave changes, undetectable bicarbonate, and she also had a white blood cell count significantly elevated. Unclear what her source of sepsis may be of this time, though we are waiting urine samples. She also has a wound on her left lower extremity, and her family states she did have septicemia from that wound previously and had a wound VAC on it for a long time, but is not  currently having symptomsfrom it. Family is also concerned that there is possibility that the patient's boyfriend may have been visiting her in the nursing facility and family is concerned that he may provide her alcohol. Family states the patient also has been refusing to eat for the past 2-3 weeks. Her blood pressure was initially low but has responded relatively well to IV fluids. Hospitalists were called for admission and further workup. Psychiatry notes that she appears to of lost probably well over 20 pounds in a person who was already small. Her current labs are extremely abnormal. Creatinine which as late as this summer was only slightly abnormal has shot up through the roof. Multiple abnormalities. I do see from some of the notes that some providers have been able to interact with her verbally over the last couple days although it sounds like that was incoherent as well. Probably is having a delirium. Question as to why she has not eaten for a couple weeks which is certainly possible. Certainly depression could be one cause of this although it's hard to separate from all her other medical problems. Pt currently NPO with orders received for Bedside Swallow Evaluation.    Assessment / Plan / Recommendation Clinical Impression  Pt appears at mdierate risk for aspiration d/t dealyed cough with puree and inability to attend to cup and straw when presented to lips. With Max A verbal cues, pt able to open eyes for trials of thin liquids via spoon. Pt appeared to tolerate well and no overt s/s of  aspiration were present. Recommend full nursing supervision and full liquid diet but spoon for regulation of bolus size. PO's should only be presented when pt is fully alert. Medicine can be attempted crushed in puree if tolerated. Education provided to nursing. ST to follow for diet toleration and possibility of further diet advancement.     Aspiration Risk  Moderate aspiration risk    Diet Recommendation  (Full  thin liquid diet)   Liquid Administration via: Spoon Medication Administration: Crushed with puree Supervision: Full supervision/cueing for compensatory strategies;Staff to assist with self feeding Compensations: Minimize environmental distractions;Slow rate;Small sips/bites Postural Changes: Seated upright at 90 degrees;Remain upright for at least 30 minutes after po intake    Other  Recommendations Oral Care Recommendations: Oral care BID   Follow up Recommendations  (TBD)      Frequency and Duration min 2x/week  2 weeks       Prognosis Prognosis for Safe Diet Advancement: Guarded Barriers to Reach Goals: Cognitive deficits;Severity of deficits      Swallow Study   General Date of Onset: 01/20/16 HPI: LisaSquilliniis a 60 y.o.femalewho presents with Significant electrolyte derangements and failure to thrive. Patient presents from nursing facility where she was noted to have low blood pressure. Here in the ED she was found to have severe electrolyte derangements including hyperkalemia with T-wave changes, undetectable bicarbonate, and she also had a white blood cell count significantly elevated. Unclear what her source of sepsis may be of this time, though we are waiting urine samples. She also has a wound on her left lower extremity, and her family states she did have septicemia from that wound previously and had a wound VAC on it for a long time, but is not currently having symptomsfrom it. Family is also concerned that there is possibility that the patient's boyfriend may have been visiting her in the nursing facility and family is concerned that he may provide her alcohol. Family states the patient also has been refusing to eat for the past 2-3 weeks. Her blood pressure was initially low but has responded relatively well to IV fluids. Hospitalists were called for admission and further workup. Psychiatry notes that she appears to of lost probably well over 20 pounds in a person who  was already small. Her current labs are extremely abnormal. Creatinine which as late as this summer was only slightly abnormal has shot up through the roof. Multiple abnormalities. I do see from some of the notes that some providers have been able to interact with her verbally over the last couple days although it sounds like that was incoherent as well. Probably is having a delirium. Question as to why she has not eaten for a couple weeks which is certainly possible. Certainly depression could be one cause of this although it's hard to separate from all her other medical problems. Pt currently NPO with orders received for Bedside Swallow Evaluation.  Type of Study: Bedside Swallow Evaluation Previous Swallow Assessment:  (None in chart) Diet Prior to this Study: NPO Temperature Spikes Noted: No Respiratory Status: Room air History of Recent Intubation: No Behavior/Cognition: Lethargic/Drowsy;Distractible;Requires cueing;Doesn't follow directions Oral Cavity Assessment: Within Functional Limits Oral Care Completed by SLP: Yes Oral Cavity - Dentition: Poor condition;Missing dentition Vision:  (Unable to determine) Self-Feeding Abilities: Total assist Patient Positioning: Upright in bed Baseline Vocal Quality: Normal;Low vocal intensity Volitional Cough: Weak Volitional Swallow: Unable to elicit    Oral/Motor/Sensory Function Overall Oral Motor/Sensory Function: Within functional limits   Ice Chips Ice chips:  Within functional limits Presentation: Spoon Other Comments:  (6 trials fed by SLP)   Thin Liquid Thin Liquid: Within functional limits Presentation: Spoon;Cup;Straw Other Comments: Pt with varying attention, unable ot preceived straw and cup at times, safest consumption via spoon for regulation of bolus size.     Nectar Thick Nectar Thick Liquid: Not tested   Honey Thick Honey Thick Liquid: Not tested   Puree Puree: Impaired Presentation: Spoon Oral Phase Impairments: Poor awareness  of bolus;Reduced lingual movement/coordination Oral Phase Functional Implications: Prolonged oral transit;Oral holding Pharyngeal Phase Impairments: Cough - Delayed   Solid   GO   Solid: Not tested       Kaeo Jacome B. Dreama Saa M.S., CCC-SLP Speech-Language Pathologist  Carmencita Cusic 01/22/2016,12:11 PM

## 2016-01-22 NOTE — Plan of Care (Signed)
Problem: SLP Dysphagia Goals Goal: Misc Dysphagia Goal Pt will consume least restrictive diet without overt s/s of aspiration/dysphagia to reduce risk of aspiration pneumonia, dehyrdation, malnutrition and choking during this hospitalization.   Comments: Pt will consume least restrictive diet without overt s/s of aspiration/dysphagia to reduce risk of aspiration pneumonia, dehyrdation, malnutrition and choking during this hospitalization.

## 2016-01-22 NOTE — Care Management (Signed)
Patient transferred from ICU to 2A.  She is from a skilled nursing facility. Palliative care consult pending.  SLP has consulted and patient placed on full liquid diet.  There are concerns that patient has not eaten for several weeks.  Psychiatry and nephrology following.  Patient is very hard of hearing and says her hearing aid for her left ear is broken and needs a new one.

## 2016-01-22 NOTE — Consult Note (Signed)
[facility] Consult   Reason for Consult:  Consult for 60 year old woman with a report that she has not eaten for several weeks. Referring Physician:  Gouru Patient Identification: Ruth Price MRN:  [id number] Principal Diagnosis: Acute delirium Diagnosis:   Patient Active Problem List   Diagnosis Date Noted  . Pressure injury of skin [L89.90] 01/21/2016  . Acute delirium [R41.0] 01/21/2016  . Septic shock ([place]) [A41.9, R65.21] 01/20/2016  . AKI (acute kidney injury) ([place]) [N17.9] 01/20/2016  . HTN (hypertension) [I10] 01/20/2016  . RA (rheumatoid arthritis) ([place]) [M06.9] 01/20/2016  . HLD (hyperlipidemia) [E78.5] 01/20/2016  . Severe sepsis ([place]) [A41.9, R65.20] 01/20/2016  . Hyperkalemia [E87.5] 01/20/2016  . Protein-calorie malnutrition, severe [E43] 10/30/2015  . Alzheimer's dementia [G30.9] 10/27/2015  . Alcohol abuse [F10.10] 10/27/2015  . 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 CVA (cerebral infarction) [I63.9] 06/08/2015    Total Time spent with patient: 20 minutes  Subjective:   Ruth Price is a 60 y.o. female patient admitted with patient not able to give any information.  Follow-up for Tuesday the 19th.'s patient seen again today. Chart reviewed. This time I shouted at her very loudly during the conversation and was able to get more response out of her. She did open her eyes and speak with me. She indicated that she knew she was in the hospital but didn't know why. I asked her once again why she was not eating and she gave the same excuse as earlier that she does not like the food. She couldn't give much other coherent history.  HPI:  Came to see the patient. Reviewed the chart. Patient was unarousable. I turned on the lights spoke her name loudly gently shook her by the shoulder patient didn't make any response at all. Liquor at her eyelids briefly. No verbal interaction. This is a 60 year old woman with a history of chronic multiple  medical problems but who appears at least as late as this summer to have still been able to engage in normal conversation and attend doctor's appointments. There was some report of some memory problems but the description of her interactions was nothing like her current presentation. She appears to of lost probably well over 20 pounds in a person who was already small. Her current labs are extremely abnormal. Creatinine which as late as this summer was only slightly abnormal has shot up through the roof. Multiple abnormalities. I do see from some of the notes that some providers have been able to interact with her verbally over the last couple days although it sounds like that was incoherent as well. Probably is having a delirium. Question as to why she has not eaten for a couple weeks which is certainly possible. Certainly depression could be one cause of this although it's hard to separate from all her other medical problems.  Social history: Evidently some information came from some of the relatives. I'm not sure about where her most recent residence was.  Medical history: Rheumatoid arthritis history of osteomyelitis history of infections currently with severe malnutrition. Alzheimer's dementia is mention but I think that is probably inaccurate. Patient is far too young for that and nothing in the previous history sounds like it was the case. She does have a past history of a stroke but apparently was still in reasonably good condition even after that.  Substance abuse history: Family is reported to of express concerns about alcohol use. I don't really see anything else I can put my finger on in  the old chart however about alcohol use history.  Past Psychiatric History: I don't think I can find anything very clearly to indicate previous psychiatric problems. No known history of severe depression or suicide attempts.  Risk to Self: Is patient at risk for suicide?: No Risk to Others:   Prior Inpatient  Therapy:   Prior Outpatient Therapy:    Past Medical History:  Past Medical History:  Diagnosis Date  . Alcohol abuse   . Alzheimer's disease   . 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 Cerebral infarction ([place])   . Dementia   . Difficulty walking   . Essential hypertension   . Hip pain, chronic, right   . Hyperlipidemia   . Hypertension   . Muscle wasting   . Osteomyelitis ([place])   . RA (rheumatoid arthritis) ([place])   . Raynaud's syndrome   . Rheumatoid arthritis ([place])   . Vascular dementia without behavioral disturbance     Past Surgical History:  Procedure Laterality Date  . APPLICATION OF WOUND VAC Left 10/31/2015   Procedure: APPLICATION OF WOUND VAC;  Surgeon: Samara Deist, DPM;  Location: [facility] ORS;  Service: Podiatry;  Laterality: Left;  . IRRIGATION AND DEBRIDEMENT FOOT Left 10/31/2015   Procedure: IRRIGATION AND DEBRIDEMENT FOOT;  Surgeon: Samara Deist, DPM;  Location: [facility] ORS;  Service: Podiatry;  Laterality: Left;  . JOINT REPLACEMENT    . SPINE SURGERY     Family History:  Family History  Problem Relation Age of Onset  . Diabetes Mother   . Heart disease Father    Family Psychiatric  History: Unknown Social History:  History  Alcohol Use No     History  Drug Use No    Social History   Social History  . Marital status: Widowed    Spouse name: N/A  . Number of children: N/A  . Years of education: N/A   Social History Main Topics  . Smoking status: Never Smoker  . Smokeless tobacco: Never Used  . Alcohol use No  . Drug use: No  . Sexual activity: Not Asked   Other Topics Concern  . None   Social History Narrative  . None   Additional Social History:    Allergies:   Allergies  Allergen Reactions  . Levofloxacin Other (See Comments)    Reaction:  Unknown     Labs:  Results for orders placed or performed during the hospital encounter of 01/20/16 (from the past 48 hour(s))  CBC with Differential/Platelet     Status: Abnormal   Collection  Time: 01/20/16  8:12 PM  Result Value Ref Range   WBC 37.7 (H) 3.6 - 11.0 K/uL   RBC 3.75 (L) 3.80 - 5.20 MIL/uL   Hemoglobin 11.3 (L) 12.0 - 16.0 g/dL   HCT 36.2 35.0 - 47.0 %   MCV 96.7 80.0 - 100.0 fL   MCH 30.1 26.0 - 34.0 pg   MCHC 31.1 (L) 32.0 - 36.0 g/dL   RDW 19.9 (H) 11.5 - 14.5 %   Platelets 378 150 - 440 K/uL   Neutrophils Relative % 93 %   Neutro Abs 35.1 (H) 1.4 - 6.5 K/uL   Lymphocytes Relative 3 %   Lymphs Abs 1.1 1.0 - 3.6 K/uL   Monocytes Relative 3 %   Monocytes Absolute 1.2 (H) 0.2 - 0.9 K/uL   Eosinophils Relative 0 %   Eosinophils Absolute 0.0 0 - 0.7 K/uL   Basophils Relative 1 %   Basophils Absolute 0.2 (H) 0 -  0.1 K/uL  Comprehensive metabolic panel     Status: Abnormal   Collection Time: 01/20/16  8:12 PM  Result Value Ref Range   Sodium 133 (L) 135 - 145 mmol/L   Potassium 6.7 (HH) 3.5 - 5.1 mmol/L    Comment: CRITICAL RESULT CALLED TO, READ BACK BY AND VERIFIED WITH MEGAN JONES ON 01/20/16 AT 2050 QSD    Chloride 104 101 - 111 mmol/L   CO2 <7 (L) 22 - 32 mmol/L   Glucose, Bld 68 65 - 99 mg/dL   BUN 93 (H) 6 - 20 mg/dL   Creatinine, Ser 7.66 (H) 0.44 - 1.00 mg/dL   Calcium 8.7 (L) 8.9 - 10.3 mg/dL   Total Protein 7.5 6.5 - 8.1 g/dL   Albumin 2.8 (L) 3.5 - 5.0 g/dL   AST 17 15 - 41 U/L   ALT 14 14 - 54 U/L   Alkaline Phosphatase 107 38 - 126 U/L   Total Bilirubin 1.6 (H) 0.3 - 1.2 mg/dL   GFR calc non Af Amer 5 (L) >60 mL/min   GFR calc Af Amer 6 (L) >60 mL/min    Comment: (NOTE) The eGFR has been calculated using the CKD EPI equation. This calculation has not been validated in all clinical situations. eGFR's persistently <60 mL/min signify possible Chronic Kidney Disease.    Anion gap SEE COMMENTS 5 - 15    Comment: NOT CALCULATED  Magnesium     Status: Abnormal   Collection Time: 01/20/16  8:12 PM  Result Value Ref Range   Magnesium 1.5 (L) 1.7 - 2.4 mg/dL  Troponin I     Status: Abnormal   Collection Time: 01/20/16  8:12 PM  Result  Value Ref Range   Troponin I 0.07 (HH) <0.03 ng/mL    Comment: CRITICAL RESULT CALLED TO, READ BACK BY AND VERIFIED WITH MEGAN JONES ON 01/20/16 AT 2050 QSD   Lactic acid, plasma     Status: None   Collection Time: 01/20/16  9:24 PM  Result Value Ref Range   Lactic Acid, Venous 0.9 0.5 - 1.9 mmol/L  Culture, blood (Routine X 2) w Reflex to ID Panel     Status: None (Preliminary result)   Collection Time: 01/20/16  9:49 PM  Result Value Ref Range   Specimen Description BLOOD LEFT FOREARM    Special Requests BOTTLES DRAWN AEROBIC AND ANAEROBIC [id number]    Culture NO GROWTH 2 DAYS    Report Status PENDING   Culture, blood (Routine X 2) w Reflex to ID Panel     Status: None (Preliminary result)   Collection Time: 01/20/16  9:49 PM  Result Value Ref Range   Specimen Description BLOOD LEFT FOREARM    Special Requests BOTTLES DRAWN AEROBIC AND ANAEROBIC [place]    Culture NO GROWTH 2 DAYS    Report Status PENDING   Glucose, capillary     Status: Abnormal   Collection Time: 01/20/16 11:38 PM  Result Value Ref Range   Glucose-Capillary 138 (H) 65 - 99 mg/dL  MRSA PCR Screening     Status: None   Collection Time: 01/21/16 12:21 AM  Result Value Ref Range   MRSA by PCR NEGATIVE NEGATIVE    Comment:        The GeneXpert MRSA Assay (FDA approved for NASAL specimens only), is one component of a comprehensive MRSA colonization surveillance program. It is not intended to diagnose MRSA infection nor to guide or monitor treatment for MRSA infections.   Urinalysis, Routine  w reflex microscopic     Status: Abnormal   Collection Time: 01/21/16 12:57 AM  Result Value Ref Range   Color, Urine AMBER (A) YELLOW    Comment: BIOCHEMICALS MAY BE AFFECTED BY COLOR   APPearance CLOUDY (A) CLEAR   Specific Gravity, Urine 1.021 1.005 - 1.030   pH 5.0 5.0 - 8.0   Glucose, UA NEGATIVE NEGATIVE mg/dL   Hgb urine dipstick NEGATIVE NEGATIVE   Bilirubin Urine NEGATIVE NEGATIVE   Ketones,  ur 5 (A) NEGATIVE mg/dL   Protein, ur 100 (A) NEGATIVE mg/dL   Nitrite NEGATIVE NEGATIVE   Leukocytes, UA MODERATE (A) NEGATIVE   RBC / HPF 6-30 0 - 5 RBC/hpf   WBC, UA 6-30 0 - 5 WBC/hpf   Bacteria, UA RARE (A) NONE SEEN   Squamous Epithelial / LPF 0-5 (A) NONE SEEN   Hyaline Casts, UA PRESENT    Amorphous Crystal PRESENT   Urine culture     Status: None   Collection Time: 01/21/16 12:57 AM  Result Value Ref Range   Specimen Description URINE, RANDOM    Special Requests NONE    Culture NO GROWTH Performed at [facility]     Report Status 01/22/2016 FINAL   Troponin I     Status: Abnormal   Collection Time: 01/21/16  3:06 AM  Result Value Ref Range   Troponin I 0.08 (HH) <0.03 ng/mL    Comment: CRITICAL VALUE NOTED. VALUE IS CONSISTENT WITH PREVIOUSLY REPORTED/CALLED VALUE.PMH  Basic metabolic panel     Status: Abnormal   Collection Time: 01/21/16  3:06 AM  Result Value Ref Range   Sodium 134 (L) 135 - 145 mmol/L   Potassium 5.0 3.5 - 5.1 mmol/L   Chloride 110 101 - 111 mmol/L   CO2 10 (L) 22 - 32 mmol/L   Glucose, Bld 156 (H) 65 - 99 mg/dL   BUN 85 (H) 6 - 20 mg/dL   Creatinine, Ser 6.81 (H) 0.44 - 1.00 mg/dL   Calcium 7.7 (L) 8.9 - 10.3 mg/dL   GFR calc non Af Amer 6 (L) >60 mL/min   GFR calc Af Amer 7 (L) >60 mL/min    Comment: (NOTE) The eGFR has been calculated using the CKD EPI equation. This calculation has not been validated in all clinical situations. eGFR's persistently <60 mL/min signify possible Chronic Kidney Disease.    Anion gap 14 5 - 15  Troponin I     Status: Abnormal   Collection Time: 01/21/16  7:14 AM  Result Value Ref Range   Troponin I 0.04 (HH) <0.03 ng/mL    Comment: CRITICAL VALUE NOTED. VALUE IS CONSISTENT WITH PREVIOUSLY REPORTED/CALLED VALUE JLJ  Basic metabolic panel     Status: Abnormal   Collection Time: 01/21/16  7:14 AM  Result Value Ref Range   Sodium 137 135 - 145 mmol/L   Potassium 4.4 3.5 - 5.1 mmol/L   Chloride 112  (H) 101 - 111 mmol/L   CO2 14 (L) 22 - 32 mmol/L   Glucose, Bld 124 (H) 65 - 99 mg/dL   BUN 76 (H) 6 - 20 mg/dL   Creatinine, Ser 5.82 (H) 0.44 - 1.00 mg/dL   Calcium 7.1 (L) 8.9 - 10.3 mg/dL   GFR calc non Af Amer 7 (L) >60 mL/min   GFR calc Af Amer 8 (L) >60 mL/min    Comment: (NOTE) The eGFR has been calculated using the CKD EPI equation. This calculation has not been validated in all  clinical situations. eGFR's persistently <60 mL/min signify possible Chronic Kidney Disease.    Anion gap 11 5 - 15  CBC     Status: Abnormal   Collection Time: 01/21/16  7:14 AM  Result Value Ref Range   WBC 15.1 (H) 3.6 - 11.0 K/uL   RBC 2.68 (L) 3.80 - 5.20 MIL/uL   Hemoglobin 8.2 (L) 12.0 - 16.0 g/dL    Comment: RESULT REPEATED AND VERIFIED   HCT 25.2 (L) 35.0 - 47.0 %   MCV 93.9 80.0 - 100.0 fL   MCH 30.7 26.0 - 34.0 pg   MCHC 32.7 32.0 - 36.0 g/dL   RDW 19.3 (H) 11.5 - 14.5 %   Platelets 220 150 - 440 K/uL  Basic metabolic panel     Status: Abnormal   Collection Time: 01/21/16 10:57 AM  Result Value Ref Range   Sodium 137 135 - 145 mmol/L   Potassium 4.0 3.5 - 5.1 mmol/L   Chloride 111 101 - 111 mmol/L   CO2 15 (L) 22 - 32 mmol/L   Glucose, Bld 122 (H) 65 - 99 mg/dL   BUN 76 (H) 6 - 20 mg/dL   Creatinine, Ser 5.31 (H) 0.44 - 1.00 mg/dL   Calcium 6.9 (L) 8.9 - 10.3 mg/dL   GFR calc non Af Amer 8 (L) >60 mL/min   GFR calc Af Amer 9 (L) >60 mL/min    Comment: (NOTE) The eGFR has been calculated using the CKD EPI equation. This calculation has not been validated in all clinical situations. eGFR's persistently <60 mL/min signify possible Chronic Kidney Disease.    Anion gap 11 5 - 15  Troponin I     Status: Abnormal   Collection Time: 01/21/16  2:07 PM  Result Value Ref Range   Troponin I 0.03 (HH) <0.03 ng/mL    Comment: CRITICAL VALUE NOTED. VALUE IS CONSISTENT WITH PREVIOUSLY REPORTED/CALLED VALUE JLJ  Basic metabolic panel     Status: Abnormal   Collection Time: 01/21/16   2:07 PM  Result Value Ref Range   Sodium 137 135 - 145 mmol/L   Potassium 4.0 3.5 - 5.1 mmol/L   Chloride 111 101 - 111 mmol/L   CO2 16 (L) 22 - 32 mmol/L   Glucose, Bld 116 (H) 65 - 99 mg/dL   BUN 73 (H) 6 - 20 mg/dL   Creatinine, Ser 4.81 (H) 0.44 - 1.00 mg/dL   Calcium 6.8 (L) 8.9 - 10.3 mg/dL   GFR calc non Af Amer 9 (L) >60 mL/min   GFR calc Af Amer 10 (L) >60 mL/min    Comment: (NOTE) The eGFR has been calculated using the CKD EPI equation. This calculation has not been validated in all clinical situations. eGFR's persistently <60 mL/min signify possible Chronic Kidney Disease.    Anion gap 10 5 - 15  Albumin     Status: Abnormal   Collection Time: 01/21/16  2:07 PM  Result Value Ref Range   Albumin 1.8 (L) 3.5 - 5.0 g/dL  Basic metabolic panel     Status: Abnormal   Collection Time: 01/21/16  6:00 PM  Result Value Ref Range   Sodium 138 135 - 145 mmol/L   Potassium 3.7 3.5 - 5.1 mmol/L   Chloride 110 101 - 111 mmol/L   CO2 19 (L) 22 - 32 mmol/L   Glucose, Bld 127 (H) 65 - 99 mg/dL   BUN 69 (H) 6 - 20 mg/dL   Creatinine, Ser 4.48 (H) 0.44 -  1.00 mg/dL   Calcium 6.7 (L) 8.9 - 10.3 mg/dL   GFR calc non Af Amer 10 (L) >60 mL/min   GFR calc Af Amer 11 (L) >60 mL/min    Comment: (NOTE) The eGFR has been calculated using the CKD EPI equation. This calculation has not been validated in all clinical situations. eGFR's persistently <60 mL/min signify possible Chronic Kidney Disease.    Anion gap 9 5 - 15    Current Facility-Administered Medications  Medication Dose Route Frequency Provider Last Rate Last Dose  . acetaminophen (TYLENOL) tablet 650 mg  650 mg Oral Q6H PRN Lance Coon, MD   650 mg at 01/22/16 1447   Or  . acetaminophen (TYLENOL) suppository 650 mg  650 mg Rectal Q6H PRN Lance Coon, MD      . ceFEPIme (MAXIPIME) 1 GM / 58m IVPB premix  1 g Intravenous Q24H DLance Coon MD   1 g at 01/22/16 1826  . collagenase (SANTYL) ointment   Topical Daily ANicholes Mango MD   1 application at [phone] . folic acid (FOLVITE) tablet 1 mg  1 mg Oral Daily DLance Coon MD   1 mg at 01/22/16 1447  . heparin injection 5,000 Units  5,000 Units Subcutaneous Q8H DLance Coon MD   5,000 Units at 01/22/16 1505  . LORazepam (ATIVAN) tablet 1 mg  1 mg Oral Q6H PRN DLance Coon MD       Or  . LORazepam (ATIVAN) injection 1 mg  1 mg Intravenous Q6H PRN DLance Coon MD   1 mg at 01/21/16 1848  . LORazepam (ATIVAN) tablet 0-4 mg  0-4 mg Oral Q6H DLance Coon MD   2 mg at 01/22/16 1448   Followed by  . [START ON 01/23/2016] LORazepam (ATIVAN) tablet 0-4 mg  0-4 mg Oral Q12H DLance Coon MD      . magic mouthwash w/lidocaine  5 mL Oral TID PRN DLance Coon MD      . multivitamin with minerals tablet 1 tablet  1 tablet Oral Daily DLance Coon MD   1 tablet at 01/22/16 1447  . mupirocin cream (BACTROBAN) 2 %   Topical Daily ANicholes Mango MD   1 application at [phone] . ondansetron (ZOFRAN) tablet 4 mg  4 mg Oral Q6H PRN DLance Coon MD       Or  . ondansetron ([facility] injection 4 mg  4 mg Intravenous Q6H PRN DLance Coon MD      . sodium bicarbonate 150 mEq in dextrose 5 % 1,000 mL infusion   Intravenous Continuous DLance Coon MD 100 mL/hr at 01/22/16 0931    . sodium chloride flush (NS) 0.9 % injection 3 mL  3 mL Intravenous Q12H DLance Coon MD   3 mL at 01/22/16 0933  . thiamine (VITAMIN B-1) tablet 100 mg  100 mg Oral Daily DLance Coon MD       Or  . thiamine (B-1) injection 100 mg  100 mg Intravenous Daily DLance Coon MD   100 mg at 01/22/16 0932    Musculoskeletal: Strength & Muscle Tone: atrophy Gait & Station: unable to stand Patient leans: N/A  Psychiatric Specialty Exam: Physical Exam  Constitutional: She appears well-nourished. She appears cachectic.  HENT:  Head: Normocephalic and atraumatic.  Eyes: Conjunctivae are normal. Pupils are equal, round, and reactive to light.  Neck: Normal range of motion.  Cardiovascular:  Normal heart sounds.   Respiratory: Effort normal.  GI: Soft.  Musculoskeletal: Normal range of  motion.  Skin: Skin is warm and dry.  Psychiatric:  Patient was unarousable    Review of Systems  Unable to perform ROS: Medical condition    Blood pressure (!) 120/57, pulse 85, temperature 98.7 F (37.1 C), temperature source Oral, resp. rate 18, height 5' 3"  (1.6 m), weight 38.2 kg (84 lb 3.5 oz), SpO2 (!) 77 %.Body mass index is 14.92 kg/m.  General Appearance: Very sick  Eye Contact:  None  Speech:  Negative  Volume:  Negative  Mood:  Negative  Affect:  Negative  Thought Process:  NA  Orientation:  Negative  Thought Content:  Negative  Suicidal Thoughts:  Unknown  Homicidal Thoughts:  Unknown  Memory:  Negative  Judgement:  Negative  Insight:  Negative  Psychomotor Activity:  Negative  Concentration:  Concentration: Negative  Recall:  Negative  Fund of Knowledge:  Negative  Language:  Negative  Akathisia:  Negative  Handed:  Right  AIMS (if indicated):     Assets:  Social Support  ADL's:  Impaired  Cognition:  Impaired,  Severe  Sleep:        Treatment Plan Summary: Plan She was a little bit more amenable to intervention today. She tells me that she likes chocolate ice cream. I would certainly encourage whatever food ice cream chocolate what ever be brought to her to try and get her to eat a little bit more. Probably could use having somebody help her to eat as well. Because she is still staying so sedated I am hesitant to add any medicines like mirtazapine but I will continue to follow-up while she is in the hospital.  Disposition: See note above  Alethia Berthold, MD 01/22/2016 6:27 PM

## 2016-01-22 NOTE — Progress Notes (Signed)
ANTIBIOTIC CONSULT NOTE - INITIAL  Pharmacy Consult for Cefepime, Vancomycin Indication: Sepsis  Allergies  Allergen Reactions  . Levofloxacin Other (See Comments)    Reaction:  Unknown     Patient Measurements: Height: 5\' 3"  (160 cm) Weight: 84 lb 3.5 oz (38.2 kg) IBW/kg (Calculated) : 52.4 Adjusted Body Weight: 49.4 kg   Vital Signs: Temp: 98.8 F (37.1 C) (12/19 0817) Temp Source: Oral (12/19 0817) BP: 106/52 (12/19 0817) Pulse Rate: 92 (12/19 0817) Intake/Output from previous day: 12/18 0701 - 12/19 0700 In: 3328.3 [I.V.:3328.3] Out: 300 [Urine:300] Intake/Output from this shift: No intake/output data recorded.  Labs:  Recent Labs  01/20/16 2012  01/21/16 0714 01/21/16 1057 01/21/16 1407 01/21/16 1800  WBC 37.7*  --  15.1*  --   --   --   HGB 11.3*  --  8.2*  --   --   --   PLT 378  --  220  --   --   --   CREATININE 7.66*  < > 5.82* 5.31* 4.81* 4.48*  < > = values in this interval not displayed. Estimated Creatinine Clearance: 7.9 mL/min (by C-G formula based on SCr of 4.48 mg/dL (H)). No results for input(s): VANCOTROUGH, VANCOPEAK, VANCORANDOM, GENTTROUGH, GENTPEAK, GENTRANDOM, TOBRATROUGH, TOBRAPEAK, TOBRARND, AMIKACINPEAK, AMIKACINTROU, AMIKACIN in the last 72 hours.   Microbiology: Recent Results (from the past 720 hour(s))  Culture, blood (Routine X 2) w Reflex to ID Panel     Status: None (Preliminary result)   Collection Time: 01/20/16  9:49 PM  Result Value Ref Range Status   Specimen Description BLOOD LEFT FOREARM  Final   Special Requests BOTTLES DRAWN AEROBIC AND ANAEROBIC 9CCAERO,9CCANA  Final   Culture NO GROWTH 2 DAYS  Final   Report Status PENDING  Incomplete  Culture, blood (Routine X 2) w Reflex to ID Panel     Status: None (Preliminary result)   Collection Time: 01/20/16  9:49 PM  Result Value Ref Range Status   Specimen Description BLOOD LEFT FOREARM  Final   Special Requests BOTTLES DRAWN AEROBIC AND ANAEROBIC 8CCAERO,5CCANA  Final    Culture NO GROWTH 2 DAYS  Final   Report Status PENDING  Incomplete  MRSA PCR Screening     Status: None   Collection Time: 01/21/16 12:21 AM  Result Value Ref Range Status   MRSA by PCR NEGATIVE NEGATIVE Final    Comment:        The GeneXpert MRSA Assay (FDA approved for NASAL specimens only), is one component of a comprehensive MRSA colonization surveillance program. It is not intended to diagnose MRSA infection nor to guide or monitor treatment for MRSA infections.   Urine culture     Status: None   Collection Time: 01/21/16 12:57 AM  Result Value Ref Range Status   Specimen Description URINE, RANDOM  Final   Special Requests NONE  Final   Culture NO GROWTH Performed at [facility]   Final   Report Status 01/22/2016 FINAL  Final    Medical History: Past Medical History:  Diagnosis Date  . Alcohol abuse   . Alzheimer's disease   . Arthropathic psoriasis (HCC)   . Cerebral infarction (HCC)   . Dementia   . Difficulty walking   . Essential hypertension   . Hip pain, chronic, right   . Hyperlipidemia   . Hypertension   . Muscle wasting   . Osteomyelitis (HCC)   . RA (rheumatoid arthritis) (HCC)   . Raynaud's syndrome   .  Rheumatoid arthritis (HCC)   . Vascular dementia without behavioral disturbance     Medications:  Prescriptions Prior to Admission  Medication Sig Dispense Refill Last Dose  . aspirin 81 MG chewable tablet Chew 1 tablet (81 mg total) by mouth daily. 30 tablet 2 01/20/2016 at Unknown time  . atorvastatin (LIPITOR) 40 MG tablet Take 40 mg by mouth at bedtime.   01/19/2016 at Unknown time  . B Complex-C (SUPER B COMPLEX PO) Take 1 capsule by mouth daily.   01/20/2016 at Unknown time  . Biotin 1000 MCG tablet Take 1,000 mcg by mouth daily.   01/20/2016 at Unknown time  . calcium carbonate (OSCAL) 1500 (600 Ca) MG TABS tablet Take 600 mg of elemental calcium by mouth daily with breakfast.   01/20/2016 at Unknown time  . cyclobenzaprine  (FLEXERIL) 10 MG tablet Take 10 mg by mouth 2 (two) times daily as needed for muscle spasms.   prn at prn  . donepezil (ARICEPT) 10 MG tablet Take 10 mg by mouth at bedtime.   01/19/2016 at Unknown time  . ferrous sulfate 325 (65 FE) MG tablet Take 325 mg by mouth 3 (three) times daily with meals.   01/20/2016 at Unknown time  . folic acid (FOLVITE) 1 MG tablet Take 1 mg by mouth daily.   01/20/2016 at Unknown time  . Glucosamine-Chondroitin-MSM 750-400-375 MG TABS Take 1 tablet by mouth daily.   01/20/2016 at Unknown time  . lisinopril (PRINIVIL,ZESTRIL) 10 MG tablet Take 1 tablet (10 mg total) by mouth daily. 30 tablet 0 01/20/2016 at Unknown time  . loperamide (IMODIUM) 2 MG capsule Take 2 mg by mouth every 4 (four) hours as needed for diarrhea or loose stools.   prn at prn  . metoprolol tartrate (LOPRESSOR) 25 MG tablet Take 1 tablet (25 mg total) by mouth 2 (two) times daily. 60 tablet 0 01/20/2016 at 1700  . Multiple Vitamin (MULTIVITAMIN WITH MINERALS) TABS tablet Take 1 tablet by mouth daily.   01/20/2016 at Unknown time  . naproxen sodium (ANAPROX) 220 MG tablet Take 440 mg by mouth daily with breakfast.   01/20/2016 at Unknown time  . omega-3 acid ethyl esters (LOVAZA) 1 g capsule Take 2 g by mouth daily.   01/20/2016 at Unknown time  . oxyCODONE-acetaminophen (PERCOCET/ROXICET) 5-325 MG tablet Take 1 tablet by mouth every 4 (four) hours as needed for severe pain. 30 tablet 0 prn at prn  . promethazine (PHENERGAN) 25 MG tablet Take 25 mg by mouth every 6 (six) hours as needed for nausea or vomiting.   prn at prn  . senna-docusate (SENOKOT-S) 8.6-50 MG tablet Take 1 tablet by mouth at bedtime as needed for mild constipation. 30 tablet 1 prn at prn  . sodium chloride (OCEAN) 0.65 % SOLN nasal spray Place 1 spray into both nostrils daily.   01/20/2016 at Unknown time  . valproic acid (DEPAKENE) 250 MG capsule Take 250 mg by mouth 2 (two) times daily.   01/20/2016 at Unknown time  . vitamin C  (ASCORBIC ACID) 500 MG tablet Take 500 mg by mouth 2 (two) times daily.   01/20/2016 at Unknown time   Assessment: Pharmacy consulted to dose vanc and cefepime in this 60 year old female presenting with AKI, muliple electrolyte abnormalities, sepsis.    CrCl = 4.6 ml/min Ke = 0.008 hr-1 T1/2 = 86.6 hrs Vd = 26.5 L   Goal of Therapy:  Vancomycin trough level 15-20 mcg/ml  Plan:  Expected duration 10 days  with resolution of temperature and/or normalization of WBC   Cefepime 2 gm IV X 1 given in ED on 12/17 @ 22.43. Will order cefepime 1 gm IV Q24H to start 12/18 @ 18:00.  Vancomycin 500 mg IV X 1 ordered to be given 12/18 @ ~ 1:00. Based on current PK parameters, next dose will not be due until 12/22.  This pt's baseline SrCr appears to be ~ 1.23 (10/31/15).  Would wait to see if CrCl improves before scheduling further doses or troughs.   Delsa Bern, PharmD 01/22/2016,10:32 AM

## 2016-01-22 NOTE — Progress Notes (Signed)
[facility]  ROUNDING NOTE   Subjective:  Patient resting in bed. Creatinine has come down to 4.48 with a BUN of 69. She remains on bicarbonate drip at 100 cc per hour. Foley catheter was placed today.   Objective:  Vital signs in last 24 hours:  Temp:  [98 F (36.7 C)-98.8 F (37.1 C)] 98.7 F (37.1 C) (12/19 1144) Pulse Rate:  [75-104] 85 (12/19 1144) Resp:  [16-18] 18 (12/19 1144) BP: (91-120)/(42-57) 120/57 (12/19 1144) SpO2:  [77 %-99 %] 77 % (12/19 1144)  Weight change:  Filed Weights   01/20/16 1950 01/21/16 0000  Weight: 37.9 kg (83 lb 8 oz) 38.2 kg (84 lb 3.5 oz)    Intake/Output: I/O last 3 completed shifts: In: 5609.2 [I.V.:4459.2; IV Piggyback:1150] Out: 450 [Urine:450]   Intake/Output this shift:  Total I/O In: -  Out: 250 [Urine:250]  Physical Exam: General: cachetic  Head: Normocephalic, atraumatic. OMs dry  Eyes: Anicteric  Neck: Supple, trachea midline  Lungs:  Clear to auscultation, normal effort  Heart: S1S2 no rubs  Abdomen:  Soft, nontender, bowel sounds present  Extremities: no peripheral edema.  Neurologic: Nonfocal, moving all four extremities  Skin: No lesions       Basic Metabolic Panel:  Recent Labs Lab 01/20/16 2012 01/21/16 0306 01/21/16 0714 01/21/16 1057 01/21/16 1407 01/21/16 1800  NA 133* 134* 137 137 137 138  K 6.7* 5.0 4.4 4.0 4.0 3.7  CL 104 110 112* 111 111 110  CO2 <7* 10* 14* 15* 16* 19*  GLUCOSE 68 156* 124* 122* 116* 127*  BUN 93* 85* 76* 76* 73* 69*  CREATININE 7.66* 6.81* 5.82* 5.31* 4.81* 4.48*  CALCIUM 8.7* 7.7* 7.1* 6.9* 6.8* 6.7*  MG 1.5*  --   --   --   --   --     Liver Function Tests:  Recent Labs Lab 01/20/16 2012 01/21/16 1407  AST 17  --   ALT 14  --   ALKPHOS 107  --   BILITOT 1.6*  --   PROT 7.5  --   ALBUMIN 2.8* 1.8*   No results for input(s): LIPASE, AMYLASE in the last 168 hours. No results for input(s): AMMONIA in the last 168 hours.  CBC:  Recent  Labs Lab 01/20/16 2012 01/21/16 0714  WBC 37.7* 15.1*  NEUTROABS 35.1*  --   HGB 11.3* 8.2*  HCT 36.2 25.2*  MCV 96.7 93.9  PLT 378 220    Cardiac Enzymes:  Recent Labs Lab 01/20/16 2012 01/21/16 0306 01/21/16 0714 01/21/16 1407  TROPONINI 0.07* 0.08* 0.04* 0.03*    BNP: Invalid input(s): POCBNP  CBG:  Recent Labs Lab 01/20/16 2338  GLUCAP 138*    Microbiology: Results for orders placed or performed during the hospital encounter of 01/20/16  Culture, blood (Routine X 2) w Reflex to ID Panel     Status: None (Preliminary result)   Collection Time: 01/20/16  9:49 PM  Result Value Ref Range Status   Specimen Description BLOOD LEFT FOREARM  Final   Special Requests BOTTLES DRAWN AEROBIC AND ANAEROBIC 9CCAERO,9CCANA  Final   Culture NO GROWTH 2 DAYS  Final   Report Status PENDING  Incomplete  Culture, blood (Routine X 2) w Reflex to ID Panel     Status: None (Preliminary result)   Collection Time: 01/20/16  9:49 PM  Result Value Ref Range Status   Specimen Description BLOOD LEFT FOREARM  Final   Special Requests BOTTLES DRAWN AEROBIC AND ANAEROBIC 8CCAERO,5CCANA  Final   Culture NO GROWTH 2 DAYS  Final   Report Status PENDING  Incomplete  MRSA PCR Screening     Status: None   Collection Time: 01/21/16 12:21 AM  Result Value Ref Range Status   MRSA by PCR NEGATIVE NEGATIVE Final    Comment:        The GeneXpert MRSA Assay (FDA approved for NASAL specimens only), is one component of a comprehensive MRSA colonization surveillance program. It is not intended to diagnose MRSA infection nor to guide or monitor treatment for MRSA infections.   Urine culture     Status: None   Collection Time: 01/21/16 12:57 AM  Result Value Ref Range Status   Specimen Description URINE, RANDOM  Final   Special Requests NONE  Final   Culture NO GROWTH Performed at [facility]   Final   Report Status 01/22/2016 FINAL  Final    Coagulation Studies: No results for  input(s): LABPROT, INR in the last 72 hours.  Urinalysis:  Recent Labs  01/21/16 0057  COLORURINE AMBER*  LABSPEC 1.021  PHURINE 5.0  GLUCOSEU NEGATIVE  HGBUR NEGATIVE  BILIRUBINUR NEGATIVE  KETONESUR 5*  PROTEINUR 100*  NITRITE NEGATIVE  LEUKOCYTESUR MODERATE*      Imaging: Dg Chest 2 View  Result Date: 01/20/2016 CLINICAL DATA:  Failure to thrive. History of alcohol abuse, Alzheimer's disease, rheumatoid arthritis, osteomyelitis. EXAM: CHEST  2 VIEW COMPARISON:  Chest radiograph October 29, 2015. FINDINGS: Cardiomediastinal silhouette is unremarkable. Increased lung volumes without pleural effusion or focal consolidation. No pneumothorax. Interval removal of RIGHT PICC line, no retained foreign bodies. ACDF. Osteopenia. Chronic deformities of the bilateral shoulders. IMPRESSION: Hyperinflation, no acute cardiopulmonary process. Electronically Signed   By: Elon Alas M.D.   On: 01/20/2016 23:34   Ct Head Wo Contrast  Result Date: 01/20/2016 CLINICAL DATA:  Failure to thrive. History of alcohol abuse, Alzheimer's disease, rheumatoid arthritis, osteomyelitis. EXAM: CT HEAD WITHOUT CONTRAST TECHNIQUE: Contiguous axial images were obtained from the base of the skull through the vertex without intravenous contrast. COMPARISON:  MRI head Jun 08, 2015 FINDINGS: BRAIN: No intraparenchymal hemorrhage, mass effect, midline shift or acute large vascular territory infarcts. Old small cerebellar infarcts, old RIGHT temporal occipital lobe infarcts, old biparietal lobe infarcts. Patchy to confluent supratentorial white matter hypodensities. Old bilateral basal ganglia and LEFT thalamus lacunar infarcts. Moderate ventriculomegaly on the basis of global parenchymal brain volume loss. No abnormal extra-axial fluid collections. VASCULAR: Moderate calcific atherosclerosis of the carotid siphons. SKULL: No skull fracture. Moderate temporomandibular osteoarthrosis. No significant scalp soft tissue  swelling. SINUSES/ORBITS: The mastoid air-cells and included paranasal sinuses are well-aerated.The included ocular globes and orbital contents are non-suspicious. Severe included atlantodental osteoarthrosis.  None. IMPRESSION: No acute intracranial process. Multiple old lacunar infarcts and large vascular territory infarcts, unchanged. Moderate global brain atrophy and moderate to severe chronic small vessel ischemic disease. Electronically Signed   By: Elon Alas M.D.   On: 01/20/2016 23:39   US Renal  Result Date: 01/22/2016 CLINICAL DATA:  Acute renal failure. EXAM: RENAL / URINARY TRACT ULTRASOUND COMPLETE COMPARISON:  None. FINDINGS: Right Kidney: Length: 9.1 cm. Increased echogenicity consistent with chronic medical renal disease. No mass or hydronephrosis. Left Kidney: Length: 10.2 cm. Increased echogenicity consistent chronic medical renal disease. No mass or hydronephrosis. 7 mm shadowing nonobstructing renal calyceal stones are noted. Bladder: Foley catheter noted.  Trace ascites. IMPRESSION: 1.  Increased echogenicity consistent chronic medical renal disease. 2. Nonobstructing left renal calyceal stones. No  evidence of hydronephrosis. Bladder is nondistended. Foley catheter present bladder. 3. Trace ascites. Electronically Signed   By: Dilley   On: 01/22/2016 11:17     Medications:   .  sodium bicarbonate  infusion 1000 mL 100 mL/hr at 01/22/16 0931   . ceFEPIme  1 g Intravenous Q24H  . collagenase   Topical Daily  . folic acid  1 mg Oral Daily  . heparin  5,000 Units Subcutaneous Q8H  . LORazepam  0-4 mg Oral Q6H   Followed by  . [START ON 01/23/2016] LORazepam  0-4 mg Oral Q12H  . multivitamin with minerals  1 tablet Oral Daily  . mupirocin cream   Topical Daily  . sodium chloride flush  3 mL Intravenous Q12H  . thiamine  100 mg Oral Daily   Or  . thiamine  100 mg Intravenous Daily   acetaminophen **OR** acetaminophen, LORazepam **OR** LORazepam, magic  mouthwash w/lidocaine, ondansetron **OR** ondansetron (ZOFRAN) IV  Assessment/ Plan:  60 y.o. female with a PMHx of Alcohol abuse, also tremors dementia, psoriasis, hypertension, hyperlipidemia, history of osteomyelitis, rheumatoid arthritis, Raynaud's syndrome, who was admitted to [facility] on 01/20/2016 for evaluation of failure to thrive.   1.  Acute renal failure due to poor PO intake, while on lisinopril. 2.  Hyperkalemia. 3.  Severe metabolic acidosis, normal lactic acid level 4.  CKD stage III baseline Cr 1.2 5.  Malnutrition with refusal to eat at nursing home. 6.  Underlying dementia. 7.  Proteinuria.  Plan:  Renal ultrasound was negative for hydronephrosis.  We will check additionalworkup including SPEP, UPEP, ANA, ANCA antibodies, GBM antibodies, C3, and C4. Renal function does appear to be improving.  Continue IV fluid hydration with sodium bicarbonate drip.  Follow renal parameters daily and avoid nephrotoxins as possible  Further plan based upon labs from above.    LOS: 2 Taylah Dubiel 12/19/20173:25 PM

## 2016-01-23 LAB — BASIC METABOLIC PANEL
Anion gap: 6 (ref 5–15)
BUN: 49 mg/dL — AB (ref 6–20)
CHLORIDE: 99 mmol/L — AB (ref 101–111)
CO2: 38 mmol/L — ABNORMAL HIGH (ref 22–32)
Calcium: 6.5 mg/dL — ABNORMAL LOW (ref 8.9–10.3)
Creatinine, Ser: 1.73 mg/dL — ABNORMAL HIGH (ref 0.44–1.00)
GFR calc Af Amer: 35 mL/min — ABNORMAL LOW (ref >60)
GFR calc non Af Amer: 30 mL/min — ABNORMAL LOW (ref >60)
GLUCOSE: 103 mg/dL — AB (ref 65–99)
POTASSIUM: 2.8 mmol/L — AB (ref 3.5–5.1)
Sodium: 143 mmol/L (ref 135–145)

## 2016-01-23 MED ORDER — SODIUM CHLORIDE 0.9 % IV SOLN
30.0000 meq | Freq: Once | INTRAVENOUS | Status: AC
  Administered 2016-01-23: 30 meq via INTRAVENOUS
  Filled 2016-01-23: qty 15

## 2016-01-23 MED ORDER — SODIUM CHLORIDE 0.9 % IV SOLN
INTRAVENOUS | Status: DC
  Administered 2016-01-23 – 2016-01-24 (×2): via INTRAVENOUS

## 2016-01-23 MED ORDER — POTASSIUM CHLORIDE CRYS ER 20 MEQ PO TBCR
20.0000 meq | EXTENDED_RELEASE_TABLET | Freq: Once | ORAL | Status: AC
  Administered 2016-01-23: 20 meq via ORAL
  Filled 2016-01-23: qty 1

## 2016-01-23 NOTE — Clinical Social Work Note (Signed)
CSW spoke to [facility], patient can return to SNF if she still needs rehab.  CSW to complete formal assessment at a later time.  [name]. Iyad Deroo, MSW, [name] [phone]  Mon-Fri 8a-4:30p 01/23/2016 6:20 PM

## 2016-01-23 NOTE — Progress Notes (Signed)
Patient is extremely hard of hearing and has no hearing aid.  I must shout directly into her ear and it may take several attempt for her to hear correctly.  She has had multiple liquid stools, C-diff positive, her anal area is tender.  I changed the foam pad over the open areas after applying santyl,  Barrier cream was applied to the tender but intact skin areas.

## 2016-01-23 NOTE — Progress Notes (Signed)
ANTIBIOTIC CONSULT NOTE - INITIAL  Pharmacy Consult for Cefepime, Vancomycin Indication: Sepsis  Allergies  Allergen Reactions  . Levofloxacin Other (See Comments)    Reaction:  Unknown     Patient Measurements: Height: 5\' 3"  (160 cm) Weight: 84 lb 3.5 oz (38.2 kg) IBW/kg (Calculated) : 52.4 Adjusted Body Weight: 49.4 kg   Vital Signs: Temp: 98.3 F (36.8 C) (12/20 0426) Temp Source: Oral (12/19 2029) BP: 113/52 (12/20 0426) Pulse Rate: 79 (12/20 0426) Intake/Output from previous day: 12/19 0701 - 12/20 0700 In: 250 [I.V.:250] Out: 800 [Urine:800] Intake/Output from this shift: No intake/output data recorded.  Labs:  Recent Labs  01/20/16 2012  01/21/16 0714  01/21/16 1407 01/21/16 1800 01/22/16 1533 01/23/16 0542  WBC 37.7*  --  15.1*  --   --   --   --   --   HGB 11.3*  --  8.2*  --   --   --   --   --   PLT 378  --  220  --   --   --   --   --   LABCREA  --   --   --   --   --   --  133  --   CREATININE 7.66*  < > 5.82*  < > 4.81* 4.48*  --  1.73*  < > = values in this interval not displayed. Estimated Creatinine Clearance: 20.3 mL/min (by C-G formula based on SCr of 1.73 mg/dL (H)). No results for input(s): VANCOTROUGH, VANCOPEAK, VANCORANDOM, GENTTROUGH, GENTPEAK, GENTRANDOM, TOBRATROUGH, TOBRAPEAK, TOBRARND, AMIKACINPEAK, AMIKACINTROU, AMIKACIN in the last 72 hours.   Microbiology: Recent Results (from the past 720 hour(s))  Culture, blood (Routine X 2) w Reflex to ID Panel     Status: None (Preliminary result)   Collection Time: 01/20/16  9:49 PM  Result Value Ref Range Status   Specimen Description BLOOD LEFT FOREARM  Final   Special Requests BOTTLES DRAWN AEROBIC AND ANAEROBIC 9CCAERO,9CCANA  Final   Culture NO GROWTH 3 DAYS  Final   Report Status PENDING  Incomplete  Culture, blood (Routine X 2) w Reflex to ID Panel     Status: None (Preliminary result)   Collection Time: 01/20/16  9:49 PM  Result Value Ref Range Status   Specimen Description  BLOOD LEFT FOREARM  Final   Special Requests BOTTLES DRAWN AEROBIC AND ANAEROBIC 8CCAERO,5CCANA  Final   Culture NO GROWTH 3 DAYS  Final   Report Status PENDING  Incomplete  MRSA PCR Screening     Status: None   Collection Time: 01/21/16 12:21 AM  Result Value Ref Range Status   MRSA by PCR NEGATIVE NEGATIVE Final    Comment:        The GeneXpert MRSA Assay (FDA approved for NASAL specimens only), is one component of a comprehensive MRSA colonization surveillance program. It is not intended to diagnose MRSA infection nor to guide or monitor treatment for MRSA infections.   Urine culture     Status: None   Collection Time: 01/21/16 12:57 AM  Result Value Ref Range Status   Specimen Description URINE, RANDOM  Final   Special Requests NONE  Final   Culture NO GROWTH Performed at [facility]   Final   Report Status 01/22/2016 FINAL  Final  C difficile quick scan w PCR reflex     Status: Abnormal   Collection Time: 01/22/16  6:57 PM  Result Value Ref Range Status   C Diff antigen POSITIVE (  A) NEGATIVE Final    Comment: CRITICAL RESULT CALLED TO, READ BACK BY AND VERIFIED WITH: LEXIE MILLER AT 2013 01/22/2016 BY TFK.    C Diff toxin POSITIVE (A) NEGATIVE Final   C Diff interpretation Toxin producing C. difficile detected.  Final    Medical History: Past Medical History:  Diagnosis Date  . Alcohol abuse   . Alzheimer's disease   . Arthropathic psoriasis (HCC)   . Cerebral infarction (HCC)   . Dementia   . Difficulty walking   . Essential hypertension   . Hip pain, chronic, right   . Hyperlipidemia   . Hypertension   . Muscle wasting   . Osteomyelitis (HCC)   . RA (rheumatoid arthritis) (HCC)   . Raynaud's syndrome   . Rheumatoid arthritis (HCC)   . Vascular dementia without behavioral disturbance     Medications:  Prescriptions Prior to Admission  Medication Sig Dispense Refill Last Dose  . aspirin 81 MG chewable tablet Chew 1 tablet (81 mg total) by  mouth daily. 30 tablet 2 01/20/2016 at Unknown time  . atorvastatin (LIPITOR) 40 MG tablet Take 40 mg by mouth at bedtime.   01/19/2016 at Unknown time  . B Complex-C (SUPER B COMPLEX PO) Take 1 capsule by mouth daily.   01/20/2016 at Unknown time  . Biotin 1000 MCG tablet Take 1,000 mcg by mouth daily.   01/20/2016 at Unknown time  . calcium carbonate (OSCAL) 1500 (600 Ca) MG TABS tablet Take 600 mg of elemental calcium by mouth daily with breakfast.   01/20/2016 at Unknown time  . cyclobenzaprine (FLEXERIL) 10 MG tablet Take 10 mg by mouth 2 (two) times daily as needed for muscle spasms.   prn at prn  . donepezil (ARICEPT) 10 MG tablet Take 10 mg by mouth at bedtime.   01/19/2016 at Unknown time  . ferrous sulfate 325 (65 FE) MG tablet Take 325 mg by mouth 3 (three) times daily with meals.   01/20/2016 at Unknown time  . folic acid (FOLVITE) 1 MG tablet Take 1 mg by mouth daily.   01/20/2016 at Unknown time  . Glucosamine-Chondroitin-MSM 750-400-375 MG TABS Take 1 tablet by mouth daily.   01/20/2016 at Unknown time  . lisinopril (PRINIVIL,ZESTRIL) 10 MG tablet Take 1 tablet (10 mg total) by mouth daily. 30 tablet 0 01/20/2016 at Unknown time  . loperamide (IMODIUM) 2 MG capsule Take 2 mg by mouth every 4 (four) hours as needed for diarrhea or loose stools.   prn at prn  . metoprolol tartrate (LOPRESSOR) 25 MG tablet Take 1 tablet (25 mg total) by mouth 2 (two) times daily. 60 tablet 0 01/20/2016 at 1700  . Multiple Vitamin (MULTIVITAMIN WITH MINERALS) TABS tablet Take 1 tablet by mouth daily.   01/20/2016 at Unknown time  . naproxen sodium (ANAPROX) 220 MG tablet Take 440 mg by mouth daily with breakfast.   01/20/2016 at Unknown time  . omega-3 acid ethyl esters (LOVAZA) 1 g capsule Take 2 g by mouth daily.   01/20/2016 at Unknown time  . oxyCODONE-acetaminophen (PERCOCET/ROXICET) 5-325 MG tablet Take 1 tablet by mouth every 4 (four) hours as needed for severe pain. 30 tablet 0 prn at prn  .  promethazine (PHENERGAN) 25 MG tablet Take 25 mg by mouth every 6 (six) hours as needed for nausea or vomiting.   prn at prn  . senna-docusate (SENOKOT-S) 8.6-50 MG tablet Take 1 tablet by mouth at bedtime as needed for mild constipation. 30 tablet 1 prn  at prn  . sodium chloride (OCEAN) 0.65 % SOLN nasal spray Place 1 spray into both nostrils daily.   01/20/2016 at Unknown time  . valproic acid (DEPAKENE) 250 MG capsule Take 250 mg by mouth 2 (two) times daily.   01/20/2016 at Unknown time  . vitamin C (ASCORBIC ACID) 500 MG tablet Take 500 mg by mouth 2 (two) times daily.   01/20/2016 at Unknown time   Assessment: Pharmacy consulted to dose vanc and cefepime in this 60 year old female presenting with AKI, muliple electrolyte abnormalities, sepsis.    Goal of Therapy:  Expected duration 10 days with resolution of temperature and/or normalization of WBC   Plan:  Cefepime 2 gm IV X 1 given in ED on 12/17 @ 22.43. Continue cefepime 1 gm IV Q24H.   Patient is also on Vancomycin PO for Cdiff.   12/18 BCx: NGTD 12/18 UCx: neg 12/18 MRSA PCR: neg 12/19 Cdiff: positive  Delsa Bern, PharmD 01/23/2016,7:33 AM

## 2016-01-23 NOTE — Evaluation (Signed)
Physical Therapy Evaluation Patient Details Name: Ruth Price MRN: [id number] DOB: 1955-06-07 Today's Date: 01/23/2016   History of Present Illness  Ruth Price  is a 60 y.o. female who presents with Significant electrolyte derangements and failure to thrive. Patient presents from nursing facility where she was noted to have low blood pressure. Here in the ED she was found to have severe electrolyte derangements including hyperkalemia with T-wave changes, undetectable bicarbonate, and she also had a white blood cell count significantly elevated. Unclear what her source of sepsis may be of this time, though we are waiting urine samples. She also has a wound on her left lower extremity, and her family states she did have septicemia from that wound previously and had a wound VAC on it for a long time, but is not currently having symptoms from it. Family is also concerned that there is possibility that the patient's boyfriend may have been visiting her in the nursing facility and family is concerned that he may provide her alcohol. Family states the patient also has been refusing to eat for the past 2-3 weeks. Her blood pressure was initially low but has responded relatively well to IV fluids. Hospitalists were called for admission and further workup  Clinical Impression  Pt admitted with above diagnosis. Pt currently with functional limitations due to the deficits listed below (see PT Problem List).  Pt is severely malnourished and weak. She requires maxA+1 for simple bed mobility as well as maxA+1 for attempted sit to stand. Pt too weak to come to standing and unable to ambulate. Unsure how she has been participating with PT at SNF. Pt is severely HOH but also confused and agitated today. Session is very limited. Pt needs additional therapy at SNF at discharge but has to demonstrate ability to participate and make functional gains. Pt will benefit from skilled PT services to address deficits in  strength, balance, and mobility in order to return to full function at home.     Follow Up Recommendations SNF (Pending patients ability to participate)    Equipment Recommendations  Other (comment) (TBD at SNF, )    Recommendations for Other Services       Precautions / Restrictions Precautions Precautions: Fall Restrictions Weight Bearing Restrictions: No Other Position/Activity Restrictions: Enteric precautions      Mobility  Bed Mobility Overal bed mobility: Needs Assistance Bed Mobility: Supine to Sit;Sit to Supine     Supine to sit: Max assist Sit to supine: Max assist   General bed mobility comments: Pt is very weak, poor seqencing, and poor command follow. Requires maxA+1 for bed mobility and reports pain throughout entire motion  Transfers Overall transfer level: Needs assistance Equipment used: Rolling walker (2 wheeled) Transfers: Sit to/from Stand Sit to Stand: Max assist         General transfer comment: Pt is very weak and unable to come to standing even with maxA+1. Pt unable to support body weight secondary to LE weakness. Poor balance and poor safety awareness  Ambulation/Gait             General Gait Details: Unable to perform at this time  Stairs            Wheelchair Mobility    Modified Rankin (Stroke Patients Only)       Balance Overall balance assessment: Needs assistance Sitting-balance support: No upper extremity supported Sitting balance-Leahy Scale: Fair     Standing balance support: Bilateral upper extremity supported Standing balance-Leahy Scale: Zero  Pertinent Vitals/Pain Pain Assessment: Faces Faces Pain Scale: Hurts whole lot Pain Location: Pt grimaces and groans during all bed mobility. Unable to localize a single source for her pain Pain Intervention(s): Monitored during session    Home Living Family/patient expects to be discharged to:: Skilled nursing  facility                 Additional Comments: Pt unsure    Prior Function Level of Independence: Needs assistance   Gait / Transfers Assistance Needed: Pt states that she ambulates without assistive device and then states that she has not walked in a long time  ADL's / Homemaking Assistance Needed: daughter apparently helps with bathing, etc  Comments: Pt unable to provide on this date. From prior note 10/2015 pt was previously able to get out of the house occasionally and does stay active with writing, etc     Hand Dominance        Extremity/Trunk Assessment   Upper Extremity Assessment Upper Extremity Assessment: Generalized weakness;RUE deficits/detail RUE Deficits / Details: Pt only able to flex bilateral shoulders approximately 30 degrees. Reports chronic limitation in ROM secondary to "arthritis"    Lower Extremity Assessment Lower Extremity Assessment: Generalized weakness;RLE deficits/detail RLE Deficits / Details: Pt unable to perform full SLR without assist due to weakness. Unable to move legs with pressure relieving boots due to "my feet are weighted down"       Communication   Communication: HOH  Cognition Arousal/Alertness: Awake/alert Behavior During Therapy: Flat affect;Agitated Overall Cognitive Status: No family/caregiver present to determine baseline cognitive functioning                 General Comments: Pt is AOx1 at time of evaulation. Unable to provide DOB. States that she is in the hospital but unable to provide a name    General Comments      Exercises     Assessment/Plan    PT Assessment Patient needs continued PT services  PT Problem List Decreased strength;Decreased range of motion;Decreased activity tolerance;Decreased balance;Decreased mobility;Decreased cognition;Decreased knowledge of use of DME;Decreased safety awareness;Other (comment) (Severe malnutrition)          PT Treatment Interventions DME instruction;Gait  training;Functional mobility training;Therapeutic activities;Balance training;Stair training;Therapeutic exercise;Neuromuscular re-education;Cognitive remediation;Patient/family education;Manual techniques;Wheelchair mobility training    PT Goals (Current goals can be found in the Care Plan section)  Acute Rehab PT Goals Patient Stated Goal: "I want to go to heaven." When asked if patient wants to die she denies and just states that it "must be nice in heaven." PT Goal Formulation: With patient Time For Goal Achievement: 02/06/16 Potential to Achieve Goals: Poor    Frequency Min 2X/week   Barriers to discharge Decreased caregiver support      Co-evaluation               End of Session Equipment Utilized During Treatment: Gait belt Activity Tolerance: Patient limited by fatigue;Patient limited by lethargy;Patient limited by pain Patient left: in bed;with call bell/phone within reach;with bed alarm set Nurse Communication: Mobility status;Other (comment) (Reports she would like to get on bed pan)         Time: 1620-1640 PT Time Calculation (min) (ACUTE ONLY): 20 min   Charges:   PT Evaluation $PT Eval Moderate Complexity: 1 Procedure     PT G Codes:       Sharalyn Ink Jamori Biggar PT, DPT   Cheron Pasquarelli 01/23/2016, 4:50 PM

## 2016-01-23 NOTE — Consult Note (Signed)
Consultation Note Date: 01/23/2016   Patient Name: Ruth Price  DOB: 1955-03-30  MRN: [id number]  Age / Sex: 60 y.o., female  PCP: Jonathon Bellows, NP Referring Physician: Ramonita Lab, MD  Reason for Consultation: Establishing goals of care  HPI/Patient Profile: 60 y.o. female  with past medical history of vascular dementia, rheumatoid arthritis, osteomyelitis, hypertension, hyperlipidemia, and ETOH abuse admitted on 01/20/2016 with failure to thrive and low blood pressure. In ED, she was found to have severe electrolyte imbalance including hyperkalemia with t-wave changes, undetectable bicarbonate, and elevated white blood cell count. Per family, left lower extremity wound with septicemia and wound vac a few months ago. Acute kidney injury and on bicarb drip. Severe protein-calorie malnutrition-nutrition and psych consulted since patient has been refusing to eat per family. Hx of ETOH abuse and son suspicious that boyfriend was taking alcohol to her in the nursing facility. Palliative medicine consultation for goals of care.   Clinical Assessment and Goals of Care: Patient awake and alert upon arrival to room. She can tell me her name and that she is at the hospital. Denies pain or discomfort.   Spoke with son, Ruth Price, via telephone. Introduce palliative care and the support we will provide during hospitalization. Ruth Price provides a detailed description of his mother's past. She was diagnosed with RA when she was 60 years-old and was taken methotrexate for many years. Ruth Price tells me she stopped taking the methotrexate about 10 years ago because she believed she could change her diet and exercise, and feel better without the medication. He has seen her steadily decline since, but especially after the death of her husband 4 years ago.   Her current boyfriend (who has been in the picture majority of her life during a  separation from her husband) is currently back in town and Ruth Price believes he was bringing alcohol to the facility. Ruth Price does not approve of her boyfriend because "he drinks all day and night." Ruth Price tells me his mother was drinking 3 gallons of gin per week this past summer because her boyfriend is a bad influence. Joseph's father (patient's husband) died of alcoholic liver cirrhosis and liver cancer, and does not want his mother to "drink herself to death." She has been at [facility] for about three months after hospitalization for MRSA in her ankle. She has been refusing to eat and has lost a lot of weight.   Ruth Price seems to have a good understanding of the severity of his mother's acute renal failure and malnutrition. We discussed that renal functions are improving by the day but the concern with the overall big picture of malnutrition and now risk high risk of aspiration. Ruth Price and his mother have talked about her wishes in the past. She told him she would not want to be resuscitated or on life support. He asked her last night if she would want a PEG tube and she adamantly told him NO. Ruth Price states if "I can't convince her to want a feeding tube, she will die." We  discussed that she most likely will not eat and drink enough to sustain herself along with her high risk of aspiration. I also explained to Ruth Price that a PEG tube would extend her life but not give her a better quality of life and they are not recommended for patients with dementia.     Ruth Price is hopeful that she will continue to show improvement in kidney functions and desire to eat but is also very realistic that she may be nearing the end of her life. He tells me multiple times during the conversation that if she doesn't get better, he wants her to be comfortable and not to suffer. He feels she "has given up on life" and would be at peace with dying. Briefly discussed that she is eligible for hospice services and this focus would  be comfort, quality, and dignity for the time she has left. Ruth Price has kept his only brother informed on the severity of his mother's condition. Answered questions and offered support. Ruth Price would like to meet with me tomorrow, 12/21 after he gets off work.    SUMMARY OF RECOMMENDATIONS    DNR/DNI  Continue current medical interventions. Son hopeful for improvement but realistic that she may be at the end-of-life.   PMT will f/u with son tomorrow 12/21 at 4:15pm. I plan to further discuss hospice and MOST form.   Code Status/Advance Care Planning:  DNR   Symptom Management:   Per attending  Palliative Prophylaxis:   Aspiration, Delirium Protocol, Oral Care and Turn Reposition  Additional Recommendations (Limitations, Scope, Preferences):  Full Scope Treatment  Psycho-social/Spiritual:   Desire for further Chaplaincy support:yes  Additional Recommendations: Caregiving  Support/Resources and Education on Hospice  Prognosis:   Unable to determine  Discharge Planning: To Be Determined      Primary Diagnoses: Present on Admission: . Septic shock (HCC) . AKI (acute kidney injury) (HCC) . HTN (hypertension) . RA (rheumatoid arthritis) (HCC) . HLD (hyperlipidemia) . Alzheimer's dementia . Protein-calorie malnutrition, severe . Severe sepsis (HCC) . Alcohol abuse . Hyperkalemia   I have reviewed the medical record, interviewed the patient and family, and examined the patient. The following aspects are pertinent.  Past Medical History:  Diagnosis Date  . Alcohol abuse   . Alzheimer's disease   . Arthropathic psoriasis (HCC)   . Cerebral infarction (HCC)   . Dementia   . Difficulty walking   . Essential hypertension   . Hip pain, chronic, right   . Hyperlipidemia   . Hypertension   . Muscle wasting   . Osteomyelitis (HCC)   . RA (rheumatoid arthritis) (HCC)   . Raynaud's syndrome   . Rheumatoid arthritis (HCC)   . Vascular dementia without behavioral  disturbance    Social History   Social History  . Marital status: Widowed    Spouse name: N/A  . Number of children: N/A  . Years of education: N/A   Social History Main Topics  . Smoking status: Never Smoker  . Smokeless tobacco: Never Used  . Alcohol use No  . Drug use: No  . Sexual activity: Not Asked   Other Topics Concern  . None   Social History Narrative  . None   Family History  Problem Relation Age of Onset  . Diabetes Mother   . Heart disease Father    Scheduled Meds: . ceFEPIme  1 g Intravenous Q24H  . collagenase   Topical Daily  . folic acid  1 mg Oral Daily  . heparin  5,000 Units Subcutaneous Q8H  . LORazepam  0-4 mg Oral Q12H  . multivitamin with minerals  1 tablet Oral Daily  . mupirocin cream   Topical Daily  . potassium chloride (KCL MULTIRUN) 30 mEq in 265 mL IVPB  30 mEq Intravenous Once  . sodium chloride flush  3 mL Intravenous Q12H  . thiamine  100 mg Oral Daily   Or  . thiamine  100 mg Intravenous Daily  . vancomycin  125 mg Oral Q6H   Continuous Infusions: .  sodium bicarbonate  infusion 1000 mL 100 mL/hr at 01/23/16 0737   PRN Meds:.acetaminophen **OR** acetaminophen, LORazepam **OR** LORazepam, magic mouthwash w/lidocaine, ondansetron **OR** ondansetron (ZOFRAN) IV Medications Prior to Admission:  Prior to Admission medications   Medication Sig Start Date End Date Taking? Authorizing Provider  aspirin 81 MG chewable tablet Chew 1 tablet (81 mg total) by mouth daily. 06/09/15  Yes Shaune Pollack, MD  atorvastatin (LIPITOR) 40 MG tablet Take 40 mg by mouth at bedtime.   Yes Historical Provider, MD  B Complex-C (SUPER B COMPLEX PO) Take 1 capsule by mouth daily.   Yes Historical Provider, MD  Biotin 1000 MCG tablet Take 1,000 mcg by mouth daily.   Yes Historical Provider, MD  calcium carbonate (OSCAL) 1500 (600 Ca) MG TABS tablet Take 600 mg of elemental calcium by mouth daily with breakfast.   Yes Historical Provider, MD  cyclobenzaprine  (FLEXERIL) 10 MG tablet Take 10 mg by mouth 2 (two) times daily as needed for muscle spasms.   Yes Historical Provider, MD  donepezil (ARICEPT) 10 MG tablet Take 10 mg by mouth at bedtime.   Yes Historical Provider, MD  ferrous sulfate 325 (65 FE) MG tablet Take 325 mg by mouth 3 (three) times daily with meals.   Yes Historical Provider, MD  folic acid (FOLVITE) 1 MG tablet Take 1 mg by mouth daily.   Yes Historical Provider, MD  Glucosamine-Chondroitin-MSM [phone] MG TABS Take 1 tablet by mouth daily.   Yes Historical Provider, MD  lisinopril (PRINIVIL,ZESTRIL) 10 MG tablet Take 1 tablet (10 mg total) by mouth daily. 11/03/15  Yes Enedina Finner, MD  loperamide (IMODIUM) 2 MG capsule Take 2 mg by mouth every 4 (four) hours as needed for diarrhea or loose stools.   Yes Historical Provider, MD  metoprolol tartrate (LOPRESSOR) 25 MG tablet Take 1 tablet (25 mg total) by mouth 2 (two) times daily. 06/09/15  Yes Shaune Pollack, MD  Multiple Vitamin (MULTIVITAMIN WITH MINERALS) TABS tablet Take 1 tablet by mouth daily.   Yes Historical Provider, MD  naproxen sodium (ANAPROX) 220 MG tablet Take 440 mg by mouth daily with breakfast.   Yes Historical Provider, MD  omega-3 acid ethyl esters (LOVAZA) 1 g capsule Take 2 g by mouth daily.   Yes Historical Provider, MD  oxyCODONE-acetaminophen (PERCOCET/ROXICET) 5-325 MG tablet Take 1 tablet by mouth every 4 (four) hours as needed for severe pain. 11/02/15  Yes Enedina Finner, MD  promethazine (PHENERGAN) 25 MG tablet Take 25 mg by mouth every 6 (six) hours as needed for nausea or vomiting.   Yes Historical Provider, MD  senna-docusate (SENOKOT-S) 8.6-50 MG tablet Take 1 tablet by mouth at bedtime as needed for mild constipation. 11/02/15  Yes Enedina Finner, MD  sodium chloride (OCEAN) 0.65 % SOLN nasal spray Place 1 spray into both nostrils daily.   Yes Historical Provider, MD  valproic acid (DEPAKENE) 250 MG capsule Take 250 mg by mouth 2 (two) times daily.  Yes Historical  Provider, MD  vitamin C (ASCORBIC ACID) 500 MG tablet Take 500 mg by mouth 2 (two) times daily.   Yes Historical Provider, MD   Allergies  Allergen Reactions  . Levofloxacin Other (See Comments)    Reaction:  Unknown    Review of Systems  Unable to perform ROS: Mental status change   Physical Exam  Constitutional: She appears cachectic. She appears ill.  Cardiovascular: Regular rhythm and normal heart sounds.   Pulmonary/Chest: Effort normal. She has rhonchi.  Abdominal: Soft. Bowel sounds are normal. There is no tenderness.  Neurological: She is alert.  Alert to person and place  Skin: Skin is warm and dry. There is pallor.  Psychiatric: She has a normal mood and affect. Her speech is normal. Cognition and memory are impaired.  Nursing note and vitals reviewed.  Vital Signs: BP (!) 118/45   Pulse 71   Temp 98.6 F (37 C) (Oral)   Resp 16   Ht 5\' 3"  (1.6 m)   Wt 38.2 kg (84 lb 3.5 oz)   SpO2 100%   BMI 14.92 kg/m  Pain Assessment: No/denies pain   Pain Score: Asleep  SpO2: SpO2: 100 % O2 Device:SpO2: 100 % O2 Flow Rate: .O2 Flow Rate (L/min): 3 L/min  IO: Intake/output summary:   Intake/Output Summary (Last 24 hours) at 01/23/16 1540 Last data filed at 01/23/16 0440  Gross per 24 hour  Intake                0 ml  Output              550 ml  Net             -550 ml    LBM: Last BM Date: 01/22/16 Baseline Weight: Weight: 37.9 kg (83 lb 8 oz) Most recent weight: Weight: 38.2 kg (84 lb 3.5 oz)     Palliative Assessment/Data: PPS 20%   Flowsheet Rows   Flowsheet Row Most Recent Value  Intake Tab  Referral Department  Hospitalist  Unit at Time of Referral  Med/Surg Unit  Palliative Care Type  New Palliative care  Reason for referral  Clarify Goals of Care  Date of Admission  01/20/16  Date first seen by Palliative Care  01/22/16  Clinical Assessment  Palliative Performance Scale Score  20%  Psychosocial & Spiritual Assessment  Palliative Care Outcomes    Patient/Family meeting held?  Yes  Who was at the meeting?  spoke with son via telephone  Palliative Care Outcomes  Clarified goals of care, Provided psychosocial or spiritual support      Time In: 1345 Time Out: 1455 Time Total: Greater than 50%  of this time was spent counseling and coordinating care related to the above assessment and plan.  Signed by:  Vennie Homans, FNP-C Palliative Medicine Team  Phone: [phone] Fax: [phone]   Please contact Palliative Medicine Team phone at [phone] for questions and concerns.  For individual provider: See Loretha Stapler

## 2016-01-23 NOTE — Progress Notes (Signed)
[facility] - [place] at [facility]   PATIENT NAME: Ruth Price    MR#:  [id number]  DATE OF BIRTH:  1955-05-05  SUBJECTIVE:  CHIEF COMPLAINT:  Patient is Very hard of hearing, pleasant confusion but answers some   REVIEW OF SYSTEMS:   ROS limited CONSTITUTIONAL: No fever, fatigue or weakness.  EYES: No blurred or double vision.  EARS, NOSE, AND THROAT: No tinnitus or ear pain.  RESPIRATORY: No cough, shortness of breath, wheezing or hemoptysis.  CARDIOVASCULAR: No chest pain, orthopnea, edema.  GASTROINTESTINAL: No nausea, vomiting, diarrhea or abdominal pain.  HEMATOLOGY: No anemia, easy bruising or bleeding SKIN: No rash or lesion. NEUROLOGIC: No tingling, numbness, weakness.    DRUG ALLERGIES:   Allergies  Allergen Reactions  . Levofloxacin Other (See Comments)    Reaction:  Unknown     VITALS:  Blood pressure (!) 118/45, pulse 71, temperature 98.6 F (37 C), temperature source Oral, resp. rate 16, height 5\' 3"  (1.6 m), weight 38.2 kg (84 lb 3.5 oz), SpO2 100 %.  PHYSICAL EXAMINATION:  GENERAL:  60 y.o.-year-old patient lying in the bed with no acute distress.Emasciated EYES: Pupils equal, round, reactive to light and accommodation. No scleral icterus. Extraocular muscles intact.  HEENT: Head atraumatic, normocephalic. Oropharynx and nasopharynx clear.  NECK:  Supple, no jugular venous distention. No thyroid enlargement, no tenderness.  LUNGS: Normal breath sounds bilaterally, no wheezing, rales,rhonchi or crepitation. No use of accessory muscles of respiration.  CARDIOVASCULAR: S1, S2 normal. No murmurs, rubs, or gallops.  ABDOMEN: Soft, nontender, nondistended. Bowel sounds present. No organomegaly or mass.  EXTREMITIES: No pedal edema, cyanosis, or clubbing.  NEUROLOGIC: Cranial nerves II through XII are intact. Muscle strength 5/5 in all extremities. Sensation intact. Gait not checked.  PSYCHIATRIC: The patient is alert and oriented x  3.  SKIN: Stage II sacral and bilateral healed decub ulcers No obvious rash,  LABORATORY PANEL:   CBC  Recent Labs Lab 01/21/16 0714  WBC 15.1*  HGB 8.2*  HCT 25.2*  PLT 220   ------------------------------------------------------------------------------------------------------------------  Chemistries   Recent Labs Lab 01/20/16 2012  01/23/16 0542  NA 133*  < > 143  K 6.7*  < > 2.8*  CL 104  < > 99*  CO2 <7*  < > 38*  GLUCOSE 68  < > 103*  BUN 93*  < > 49*  CREATININE 7.66*  < > 1.73*  CALCIUM 8.7*  < > 6.5*  MG 1.5*  --   --   AST 17  --   --   ALT 14  --   --   ALKPHOS 107  --   --   BILITOT 1.6*  --   --   < > = values in this interval not displayed. ------------------------------------------------------------------------------------------------------------------  Cardiac Enzymes  Recent Labs Lab 01/21/16 1407  TROPONINI 0.03*   ------------------------------------------------------------------------------------------------------------------  RADIOLOGY:  01/23/16 Renal  Result Date: 01/22/2016 CLINICAL DATA:  Acute renal failure. EXAM: RENAL / URINARY TRACT ULTRASOUND COMPLETE COMPARISON:  None. FINDINGS: Right Kidney: Length: 9.1 cm. Increased echogenicity consistent with chronic medical renal disease. No mass or hydronephrosis. Left Kidney: Length: 10.2 cm. Increased echogenicity consistent chronic medical renal disease. No mass or hydronephrosis. 7 mm shadowing nonobstructing renal calyceal stones are noted. Bladder: Foley catheter noted.  Trace ascites. IMPRESSION: 1.  Increased echogenicity consistent chronic medical renal disease. 2. Nonobstructing left renal calyceal stones. No evidence of hydronephrosis. Bladder is nondistended. Foley catheter present bladder. 3. Trace ascites. Electronically Signed  ByMaisie Fus  Register   On: 01/22/2016 11:17   Dg Chest Port 1 View  Result Date: 01/22/2016 CLINICAL DATA:  Cough today. EXAM: PORTABLE CHEST 1 VIEW  COMPARISON:  01/20/2016 FINDINGS: Mild linear scarring in the bases, unchanged. No confluent airspace consolidation. No large effusions. Normal pulmonary vasculature. Hilar, mediastinal and cardiac contours are unremarkable and unchanged. Severe chronic rotator cuff arthropathy incidentally noted bilaterally. IMPRESSION: No acute cardiopulmonary findings. Electronically Signed   By: Ellery Plunk M.D.   On: 01/22/2016 21:55    EKG:   Orders placed or performed during the hospital encounter of 01/20/16  . EKG 12-Lead  . EKG 12-Lead    ASSESSMENT AND PLAN:  Patient is brought in from a nursing facility with low blood pressure and hyperkalemia with T wave changes. Very hard of hearing  #Sepsis -meets septic criteria with hypotension and leukocytosis- c diff colitis  vanc PO  Continue IV fluids   blood cultures no growth in 2 days Urine cultures-No growth patient is hemodynamically stable, lactic acid was within normal limits,  # possible aspiration ST eval     #AKI (acute kidney injury) (HCC) - poor by mouth intake-prerenal leading to ATN Continue IV fluids  Renal function is  better Baseline 1.03 in September 2017; 7.66-6.81-4.4-1. Renal ultrasound is normal. Foley catheter   appreciate nephrology recommendations  #Hyperkalemia -  resolved With T-wave changes in the time of arrival resolved and now. Patient has received insulin and D50, albuterol in the ED, bicarbonate drip as above  #  Protein-calorie malnutrition, severe -  nutrition consult, as well as psychiatry consult to try and assess why she has been refusing to eat.  f/u with psychiatry.Mirtazapine once more alert  #Sacral DQ ulcers and bilateral heel ulcers  Reposition patient every 2 hours and wound care   # HTN (hypertension) - currently blood pressure is low  we will hold antihypertensives for now  IV fluids as above   # Alzheimer's dementia - we will hold her meds    # Alcohol abuse -   the family is  suspicious she may be getting alcohol from her boyfriend. on CIWA protocol.  family wants to meet with palliative care , palliative care is following with them, need to meet with son    Discussed with pt s son , awaiting palliative care meeting  Transfer patient to the floor as she is hemodynamically stable All the records are reviewed and case discussed with Care Management/Social Workerr. Management plans discussed with the patient, son  and they are in agreement.  CODE STATUS: dnr  TOTAL TIME TAKING CARE OF THIS PATIENT: 36  minutes.   POSSIBLE D/C IN 2 DAYS, DEPENDING ON CLINICAL CONDITION.  Note: This dictation was prepared with Dragon dictation along with smaller phrase technology. Any transcriptional errors that result from this process are unintentional.   Ramonita Lab M.D on 01/23/2016 at 4:31 PM  Between 7am to 6pm - Pager - [phone] After 6pm go to [email or web address] - password [email or web address] [email or web address]  [place]  [facility]  Office  [phone]  CC: Primary care physician; Jonathon Bellows, NP

## 2016-01-23 NOTE — Progress Notes (Signed)
New order recieved. Nsg reports significant difficulty with meds and with water this am with significant coughing noted. Pt tolerated the meds crushed in applesauce. Pt has since received Ativan and ST was unable to arouse enough for any trial PO's. Will downgrade liquids to honey thick until alert enough to determine if the aspiration was related only to meds with water. Reassess at bedside tomorrow if more alert.

## 2016-01-23 NOTE — Progress Notes (Signed)
[facility]  ROUNDING NOTE   Subjective:  Renal function significantly improved today. BUN down to 49 with a creatinine of 1.73. Patient remains very hard of hearing.   Objective:  Vital signs in last 24 hours:  Temp:  [98.3 F (36.8 C)-98.6 F (37 C)] 98.6 F (37 C) (12/20 0741) Pulse Rate:  [71-108] 71 (12/20 1255) Resp:  [16] 16 (12/20 0741) BP: (110-118)/(45-61) 118/45 (12/20 1255) SpO2:  [80 %-100 %] 100 % (12/20 1255)  Weight change:  Filed Weights   01/20/16 1950 01/21/16 0000  Weight: 37.9 kg (83 lb 8 oz) 38.2 kg (84 lb 3.5 oz)    Intake/Output: I/O last 3 completed shifts: In: 1580 [I.V.:1580] Out: 800 [Urine:800]   Intake/Output this shift:  No intake/output data recorded.  Physical Exam: General: cachetic  Head: Normocephalic, atraumatic. OMs dry, extremely hard of hearing   Eyes: Anicteric  Neck: Supple, trachea midline  Lungs:  Clear to auscultation, normal effort  Heart: S1S2 no rubs  Abdomen:  Soft, nontender, bowel sounds present  Extremities: no peripheral edema.  Neurologic: Nonfocal, moving all four extremities  Skin: No lesions       Basic Metabolic Panel:  Recent Labs Lab 01/20/16 2012  01/21/16 0714 01/21/16 1057 01/21/16 1407 01/21/16 1800 01/23/16 0542  NA 133*  < > 137 137 137 138 143  K 6.7*  < > 4.4 4.0 4.0 3.7 2.8*  CL 104  < > 112* 111 111 110 99*  CO2 <7*  < > 14* 15* 16* 19* 38*  GLUCOSE 68  < > 124* 122* 116* 127* 103*  BUN 93*  < > 76* 76* 73* 69* 49*  CREATININE 7.66*  < > 5.82* 5.31* 4.81* 4.48* 1.73*  CALCIUM 8.7*  < > 7.1* 6.9* 6.8* 6.7* 6.5*  MG 1.5*  --   --   --   --   --   --   < > = values in this interval not displayed.  Liver Function Tests:  Recent Labs Lab 01/20/16 2012 01/21/16 1407  AST 17  --   ALT 14  --   ALKPHOS 107  --   BILITOT 1.6*  --   PROT 7.5  --   ALBUMIN 2.8* 1.8*   No results for input(s): LIPASE, AMYLASE in the last 168 hours. No results for input(s): AMMONIA  in the last 168 hours.  CBC:  Recent Labs Lab 01/20/16 2012 01/21/16 0714  WBC 37.7* 15.1*  NEUTROABS 35.1*  --   HGB 11.3* 8.2*  HCT 36.2 25.2*  MCV 96.7 93.9  PLT 378 220    Cardiac Enzymes:  Recent Labs Lab 01/20/16 2012 01/21/16 0306 01/21/16 0714 01/21/16 1407  TROPONINI 0.07* 0.08* 0.04* 0.03*    BNP: Invalid input(s): POCBNP  CBG:  Recent Labs Lab 01/20/16 2338  GLUCAP 138*    Microbiology: Results for orders placed or performed during the hospital encounter of 01/20/16  Culture, blood (Routine X 2) w Reflex to ID Panel     Status: None (Preliminary result)   Collection Time: 01/20/16  9:49 PM  Result Value Ref Range Status   Specimen Description BLOOD LEFT FOREARM  Final   Special Requests BOTTLES DRAWN AEROBIC AND ANAEROBIC [id number],9CCANA  Final   Culture NO GROWTH 3 DAYS  Final   Report Status PENDING  Incomplete  Culture, blood (Routine X 2) w Reflex to ID Panel     Status: None (Preliminary result)   Collection Time: 01/20/16  9:49 PM  Result Value Ref Range Status   Specimen Description BLOOD LEFT FOREARM  Final   Special Requests BOTTLES DRAWN AEROBIC AND ANAEROBIC 8CCAERO,5CCANA  Final   Culture NO GROWTH 3 DAYS  Final   Report Status PENDING  Incomplete  MRSA PCR Screening     Status: None   Collection Time: 01/21/16 12:21 AM  Result Value Ref Range Status   MRSA by PCR NEGATIVE NEGATIVE Final    Comment:        The GeneXpert MRSA Assay (FDA approved for NASAL specimens only), is one component of a comprehensive MRSA colonization surveillance program. It is not intended to diagnose MRSA infection nor to guide or monitor treatment for MRSA infections.   Urine culture     Status: None   Collection Time: 01/21/16 12:57 AM  Result Value Ref Range Status   Specimen Description URINE, RANDOM  Final   Special Requests NONE  Final   Culture NO GROWTH Performed at [facility]   Final   Report Status 01/22/2016 FINAL  Final   C difficile quick scan w PCR reflex     Status: Abnormal   Collection Time: 01/22/16  6:57 PM  Result Value Ref Range Status   C Diff antigen POSITIVE (A) NEGATIVE Final    Comment: CRITICAL RESULT CALLED TO, READ BACK BY AND VERIFIED WITH: LEXIE MILLER AT 2013 01/22/2016 BY TFK.    C Diff toxin POSITIVE (A) NEGATIVE Final   C Diff interpretation Toxin producing C. difficile detected.  Final    Coagulation Studies: No results for input(s): LABPROT, INR in the last 72 hours.  Urinalysis:  Recent Labs  01/21/16 0057  COLORURINE AMBER*  LABSPEC 1.021  PHURINE 5.0  GLUCOSEU NEGATIVE  HGBUR NEGATIVE  BILIRUBINUR NEGATIVE  KETONESUR 5*  PROTEINUR 100*  NITRITE NEGATIVE  LEUKOCYTESUR MODERATE*      Imaging: US Renal  Result Date: 01/22/2016 CLINICAL DATA:  Acute renal failure. EXAM: RENAL / URINARY TRACT ULTRASOUND COMPLETE COMPARISON:  None. FINDINGS: Right Kidney: Length: 9.1 cm. Increased echogenicity consistent with chronic medical renal disease. No mass or hydronephrosis. Left Kidney: Length: 10.2 cm. Increased echogenicity consistent chronic medical renal disease. No mass or hydronephrosis. 7 mm shadowing nonobstructing renal calyceal stones are noted. Bladder: Foley catheter noted.  Trace ascites. IMPRESSION: 1.  Increased echogenicity consistent chronic medical renal disease. 2. Nonobstructing left renal calyceal stones. No evidence of hydronephrosis. Bladder is nondistended. Foley catheter present bladder. 3. Trace ascites. Electronically Signed   By: Maisie Fus  Register   On: 01/22/2016 11:17   Dg Chest Port 1 View  Result Date: 01/22/2016 CLINICAL DATA:  Cough today. EXAM: PORTABLE CHEST 1 VIEW COMPARISON:  01/20/2016 FINDINGS: Mild linear scarring in the bases, unchanged. No confluent airspace consolidation. No large effusions. Normal pulmonary vasculature. Hilar, mediastinal and cardiac contours are unremarkable and unchanged. Severe chronic rotator cuff arthropathy  incidentally noted bilaterally. IMPRESSION: No acute cardiopulmonary findings. Electronically Signed   By: Ellery Plunk M.D.   On: 01/22/2016 21:55     Medications:   .  sodium bicarbonate  infusion 1000 mL 100 mL/hr at 01/23/16 0737   . ceFEPIme  1 g Intravenous Q24H  . collagenase   Topical Daily  . folic acid  1 mg Oral Daily  . heparin  5,000 Units Subcutaneous Q8H  . LORazepam  0-4 mg Oral Q12H  . multivitamin with minerals  1 tablet Oral Daily  . mupirocin cream   Topical Daily  . potassium chloride (KCL MULTIRUN)  30 mEq in 265 mL IVPB  30 mEq Intravenous Once  . potassium chloride  20 mEq Oral Once  . sodium chloride flush  3 mL Intravenous Q12H  . thiamine  100 mg Oral Daily   Or  . thiamine  100 mg Intravenous Daily  . vancomycin  125 mg Oral Q6H   acetaminophen **OR** acetaminophen, LORazepam **OR** LORazepam, magic mouthwash w/lidocaine, ondansetron **OR** ondansetron (ZOFRAN) IV  Assessment/ Plan:  60 y.o. female with a PMHx of Alcohol abuse, also tremors dementia, psoriasis, hypertension, hyperlipidemia, history of osteomyelitis, rheumatoid arthritis, Raynaud's syndrome, who was admitted to [facility] on 01/20/2016 for evaluation of failure to thrive.   1.  Acute renal failure due to poor PO intake, while on lisinopril.  Renal ultrasound negative for hydronephrosis. 2.  Hyperkalemia. 3.  Severe metabolic acidosis, normal lactic acid level 4.  CKD stage III baseline Cr 1.2 5.  Malnutrition with refusal to eat at nursing home. 6.  Underlying dementia. 7.  Proteinuria.  Plan:  Renal function significantly improved. Serologic workup still pending. For now continue IV fluid hydration. Follow renal function tomorrow. We will discontinue sodium bicarbonate drip as serum bicarbonate is 38 at the moment.  Instead we will switch the patient to 0.9 normal saline. Avoid nephrotoxins as possible.    LOS: 3 Raoul Ciano 12/20/20175:32 PM

## 2016-01-24 DIAGNOSIS — Z515 Encounter for palliative care: Secondary | ICD-10-CM

## 2016-01-24 DIAGNOSIS — R627 Adult failure to thrive: Secondary | ICD-10-CM

## 2016-01-24 DIAGNOSIS — Z7189 Other specified counseling: Secondary | ICD-10-CM

## 2016-01-24 DIAGNOSIS — F411 Generalized anxiety disorder: Secondary | ICD-10-CM

## 2016-01-24 LAB — CBC
HCT: 22.2 % — ABNORMAL LOW (ref 35.0–47.0)
Hemoglobin: 7.6 g/dL — ABNORMAL LOW (ref 12.0–16.0)
MCH: 31.2 pg (ref 26.0–34.0)
MCHC: 34.1 g/dL (ref 32.0–36.0)
MCV: 91.3 fL (ref 80.0–100.0)
Platelets: 146 10*3/uL — ABNORMAL LOW (ref 150–440)
RBC: 2.43 MIL/uL — AB (ref 3.80–5.20)
RDW: 18.4 % — ABNORMAL HIGH (ref 11.5–14.5)
WBC: 6 10*3/uL (ref 3.6–11.0)

## 2016-01-24 LAB — ENA+DNA/DS+ANTICH+CENTRO+JO...
Chromatin Ab SerPl-aCnc: 4.3 AI — ABNORMAL HIGH (ref 0.0–0.9)
ENA SM Ab Ser-aCnc: <0.2 AI (ref 0.0–0.9)
Ribonucleic Protein: >8 AI — ABNORMAL HIGH (ref 0.0–0.9)
SSB (La) (ENA) Antibody, IgG: <0.2 AI (ref 0.0–0.9)
Scleroderma (Scl-70) (ENA) Antibody, IgG: <0.2 AI (ref 0.0–0.9)
ds DNA Ab: 1 IU/mL (ref 0–9)

## 2016-01-24 LAB — BASIC METABOLIC PANEL
Anion gap: 4 — ABNORMAL LOW (ref 5–15)
BUN: 36 mg/dL — AB (ref 6–20)
CALCIUM: 6.3 mg/dL — AB (ref 8.9–10.3)
CO2: 39 mmol/L — ABNORMAL HIGH (ref 22–32)
CREATININE: 1.41 mg/dL — AB (ref 0.44–1.00)
Chloride: 103 mmol/L (ref 101–111)
GFR calc Af Amer: 45 mL/min — ABNORMAL LOW (ref >60)
GFR, EST NON AFRICAN AMERICAN: 39 mL/min — AB (ref >60)
Glucose, Bld: 95 mg/dL (ref 65–99)
Potassium: 4.1 mmol/L (ref 3.5–5.1)
SODIUM: 146 mmol/L — AB (ref 135–145)

## 2016-01-24 LAB — ANA W/REFLEX IF POSITIVE: ANA: POSITIVE — AB

## 2016-01-24 LAB — GLOMERULAR BASEMENT MEMBRANE ANTIBODIES: GBM AB: 3 U (ref 0–20)

## 2016-01-24 LAB — MAGNESIUM
MAGNESIUM: 1 mg/dL — AB (ref 1.7–2.4)
Magnesium: 1.6 mg/dL — ABNORMAL LOW (ref 1.7–2.4)

## 2016-01-24 MED ORDER — MAGNESIUM SULFATE 2 GM/50ML IV SOLN
2.0000 g | Freq: Once | INTRAVENOUS | Status: AC
  Administered 2016-01-24: 2 g via INTRAVENOUS
  Filled 2016-01-24: qty 50

## 2016-01-24 MED ORDER — AMOXICILLIN-POT CLAVULANATE 500-125 MG PO TABS
1.0000 | ORAL_TABLET | Freq: Two times a day (BID) | ORAL | Status: DC
  Administered 2016-01-24: 500 mg via ORAL
  Filled 2016-01-24: qty 1

## 2016-01-24 MED ORDER — SENNA 8.6 MG PO TABS: 1.0000 | ORAL_TABLET | Freq: Every day | ORAL | Status: DC | PRN

## 2016-01-24 MED ORDER — MORPHINE SULFATE (PF) 4 MG/ML IV SOLN: 1.0000 mg | INTRAVENOUS | Status: DC | PRN

## 2016-01-24 MED ORDER — MORPHINE SULFATE (CONCENTRATE) 10 MG/0.5ML PO SOLN
5.0000 mg | ORAL | Status: DC | PRN
  Administered 2016-01-25 (×3): 5 mg via ORAL
  Filled 2016-01-24 (×3): qty 1

## 2016-01-24 MED ORDER — LORAZEPAM 2 MG/ML IJ SOLN
1.0000 mg | INTRAMUSCULAR | Status: DC | PRN
  Administered 2016-01-25: 2 mg via INTRAVENOUS
  Administered 2016-01-25: 1 mg via INTRAVENOUS
  Filled 2016-01-24 (×2): qty 1

## 2016-01-24 MED ORDER — LORAZEPAM 2 MG/ML IJ SOLN: 1.0000 mg | INTRAMUSCULAR | Status: DC | PRN

## 2016-01-24 NOTE — Progress Notes (Signed)
Dr Amado Coe of aware of all patient's labs results. Ordered IV magnesium replacement

## 2016-01-24 NOTE — Progress Notes (Addendum)
Daily Progress Note   Patient Name: Ruth Price       Date: 01/24/2016 DOB: 12/14/1955  Age: 60 y.o. MRN#: [id number] Attending Physician: Nicholes Mango, MD Primary Care Physician: Herma Mering, NP Admit Date: 01/20/2016  Reason for Consultation/Follow-up: Establishing goals of care  Subjective: Patient anxious/agitated this afternoon. Cursing at family and nursing staff. She is refusing to eat and drink. RN giving ativan.   This NP met with son Broadus John) and daughter-in-law Advertising account planner) in family meeting room. Broadus John and I spoke on the phone yesterday regarding his mother's decline and overall failure to thrive. He again highlights her decline since the summer with poor PO intake, weight loss, and increased confusion. She has been refusing to eat for two weeks since boyfriend has been back in town. Broadus John states "she has lost her will to live" and "is too far gone."   When her husband died four years ago, the patient expressed to Broadus John and [employer] her wishes of no resuscitation, life support, or feeding tube. She told Broadus John again on admit to the hospital that she would not want these heroic measures to keep her alive. I discussed with them that we have reached a point where she would need a feeding tube to keep her alive because she is refusing to eat and drink. Knowing this does not fall in line with her wishes, Broadus John and Museum/gallery conservator wish for her to be comfortable and not suffer. They "want her to receive as much medication as she needs to be comfortable."  Discussed in detail transition from aggressive medical treatment to a comfort measures pathway. Educated on comfort measures, symptom management, no escalation of care, discontinuation of interventions/labs/medications/IVF not aimed at comfort, and EOL  expectations. Family wants her to eat and drink whatever she asks for and understand her high risk for aspiration. Discussed residential hospice. Broadus John and [employer], quality, and dignity for each day she may have left. They understand that time is short and anything could happen at any time. Broadus John has discussed this with his other brother and they are agreeable with moving forward with comfort measures and residential hospice facility.   I answered all questions. Hard Choices copy given.   Length of Stay: 4  Current Medications: Scheduled Meds:  . sodium chloride flush  3 mL Intravenous Q12H  Continuous Infusions:  PRN Meds: acetaminophen **OR** acetaminophen, LORazepam, magic mouthwash w/lidocaine, morphine injection, morphine CONCENTRATE, [DISCONTINUED] ondansetron **OR** ondansetron (ZOFRAN) IV, senna  Physical Exam  Constitutional: She appears cachectic.  HENT:  Head: Normocephalic and atraumatic.  Cardiovascular: Regular rhythm.   Pulmonary/Chest: Effort normal.  Abdominal: Normal appearance.  Neurological: She is alert. She is disoriented.  Skin: Skin is warm and dry. There is pallor.  Psychiatric: Her mood appears anxious. Her speech is delayed. She is agitated. Cognition and memory are impaired.  RN to give ativan  Nursing note and vitals reviewed.          Vital Signs: BP 112/82 (BP Location: Left Arm)   Pulse 74   Temp 99.7 F (37.6 C) (Oral)   Resp 19   Ht _0  (1.6 m)   Wt 38.2 kg (84 lb 3.5 oz)   SpO2 100%   BMI 14.92 kg/m  SpO2: SpO2: 100 % O2 Device: O2 Device: Nasal Cannula O2 Flow Rate: O2 Flow Rate (L/min): 4 L/min  Intake/output summary:   Intake/Output Summary (Last 24 hours) at 01/24/16 1756 Last data filed at 01/24/16 1315  Gross per 24 hour  Intake          1059.17 ml  Output              750 ml  Net           309.17 ml   LBM: Last BM Date: 01/24/16 Baseline Weight: Weight: 37.9 kg (83 lb 8 oz) Most recent weight: Weight:  38.2 kg (84 lb 3.5 oz)       Palliative Assessment/Data: PPS 20%   Flowsheet Rows   Flowsheet Row Most Recent Value  Intake Tab  Referral Department  Hospitalist  Unit at Time of Referral  Med/Surg Unit  Palliative Care Primary Diagnosis  Sepsis/Infectious Disease  Date Notified  01/21/16  Palliative Care Type  New Palliative care  Reason for referral  Clarify Goals of Care  Date of Admission  01/20/16  Date first seen by Palliative Care  01/22/16  # of days Palliative referral response time  2 Day(s)  # of days IP prior to Palliative referral  1  Clinical Assessment  Palliative Performance Scale Score  20%  Psychosocial & Spiritual Assessment  Palliative Care Outcomes  Patient/Family meeting held?  Yes  Who was at the meeting?  son and daughter-in-law  Palliative Care Outcomes  Clarified goals of care, Provided end of life care assistance, Provided psychosocial or spiritual support, Changed to focus on comfort, Counseled regarding hospice, Improved pain interventions, Improved non-pain symptom therapy      Patient Active Problem List   Diagnosis Date Noted  . Pressure injury of skin 01/21/2016  . Acute delirium 01/21/2016  . Septic shock ([place]) 01/20/2016  . AKI (acute kidney injury) ([place]) 01/20/2016  . HTN (hypertension) 01/20/2016  . RA (rheumatoid arthritis) ([place]) 01/20/2016  . HLD (hyperlipidemia) 01/20/2016  . Severe sepsis ([place]) 01/20/2016  . Hyperkalemia 01/20/2016  . Protein-calorie malnutrition, severe 10/30/2015  . Alzheimer's dementia 10/27/2015  . Alcohol abuse 10/27/2015  . 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 CVA (cerebral infarction) 06/08/2015    Palliative Care Assessment & Plan   Patient Profile: 60 y.o. female  with past medical history of vascular dementia, rheumatoid arthritis, osteomyelitis, hypertension, hyperlipidemia, and ETOH abuse admitted on 01/20/2016 with failure to thrive and low blood pressure. In ED, she was found to have  severe electrolyte imbalance including hyperkalemia with t-wave changes,  undetectable bicarbonate, and elevated white blood cell count. Per family, left lower extremity wound with septicemia and wound vac a few months ago. Acute kidney injury and on bicarb drip. Severe protein-calorie malnutrition-nutrition and psych consulted since patient has been refusing to eat per family. Hx of ETOH abuse and son suspicious that boyfriend was taking alcohol to her in the nursing facility. Palliative medicine consultation for goals of care.   Assessment: Failure to thrive Severe protein calorie malnutrition Sepsis Acute kidney injury Sacral decubitus ulcer  Bilateral heel ulcers Dementia  Recommendations/Plan:  DNR/DNI  Comfort measures only. Discontinued all medications/labs/interventions not aimed at comfort.   Symptom management  Roxanol 46m PO q1h prn pain/dyspnea/air hunger  May give Morphine 128mIV q1h prn if patient refusing to take oral  Ativan 1-63m54mV q4h prn anxiety/agitation  Senna 1 tab PO prn constipation  Wound care when needed.   Allow patient to eat/drink what she asks for, including thin liquids. Family understands high risk for aspiration.  Social work consulted for residential hospice.  PMT will f/u with family tomorrow, 01/25/16.  Goals of Care and Additional Recommendations:  Limitations on Scope of Treatment: Full Comfort Care  Code Status: DNR   Code Status Orders        Start     Ordered   01/21/16 0018  Do not attempt resuscitation (DNR)  Continuous    Question Answer Comment  In the event of cardiac or respiratory ARREST Do not call a "code blue"   In the event of cardiac or respiratory ARREST Do not perform Intubation, CPR, defibrillation or ACLS   In the event of cardiac or respiratory ARREST Use medication by any route, position, wound care, and other measures to relive pain and suffering. May use oxygen, suction and manual treatment of airway  obstruction as needed for comfort.      01/21/16 0017    Code Status History    Date Active Date Inactive Code Status Order ID Comments User Context   01/20/2016 10:19 PM 01/21/2016 12:17 AM DNR [id number] LotD ED   10/27/2015  9:13 PM 11/02/2015  9:18 PM Full Code [id number] BridgeO Inpatient   06/08/2015  2:45 PM 06/09/2015  4:36 PM Full Code [id number] LollD ED       Prognosis:   < 2 weeks-severe malnutrition, high risk for aspiration, and overall failure to thrive.   Discharge Planning:  Hospice facility  Care plan was discussed with son, daughter-in-law, RN, and Dr. GouMargaretmary Eddyhank you for allowing the Palliative Medicine Team to assist in the care of this patient.   Time In: 1600 Time Out: 1715 Total Time 44m33mrolonged Time Billed yes      Greater than 50%  of this time was spent counseling and coordinating care related to the above assessment and plan.  MegaIhor DowP-C Palliative Medicine Team  Phone: [phone]: [phone] contact Palliative Medicine Team phone at [phone] questions and concerns.

## 2016-01-24 NOTE — Progress Notes (Signed)
[facility] -  at [facility]   PATIENT NAME: Ruth Price    MR#:  [id number]  DATE OF BIRTH:  09-10-1955  SUBJECTIVE:  CHIEF COMPLAINT:  Patient is Very hard of hearing, Agitated today and refusing food for medical staff but patient ate some ice cream when I fed her today  REVIEW OF SYSTEMS:   ROS limited CONSTITUTIONAL: No fever, fatigue or weakness.  EYES: No blurred or double vision.  EARS, NOSE, AND THROAT: No tinnitus or ear pain.  RESPIRATORY: No cough, shortness of breath, wheezing or hemoptysis.  CARDIOVASCULAR: No chest pain, orthopnea, edema.  GASTROINTESTINAL: No nausea, vomiting, diarrhea or abdominal pain.  HEMATOLOGY: No anemia, easy bruising or bleeding SKIN: No rash or lesion. NEUROLOGIC: No tingling, numbness, weakness.    DRUG ALLERGIES:   Allergies  Allergen Reactions  . Levofloxacin Other (See Comments)    Reaction:  Unknown     VITALS:  Blood pressure 112/82, pulse 74, temperature 99.7 F (37.6 C), temperature source Oral, resp. rate 19, height 5\' 3"  (1.6 m), weight 38.2 kg (84 lb 3.5 oz), SpO2 100 %.  PHYSICAL EXAMINATION:  GENERAL:  60 y.o.-year-old patient lying in the bed with no acute distress.Emasciated EYES: Pupils equal, round, reactive to light and accommodation. No scleral icterus. Extraocular muscles intact.  HEENT: Head atraumatic, normocephalic. Oropharynx and nasopharynx clear.  NECK:  Supple, no jugular venous distention. No thyroid enlargement, no tenderness.  LUNGS: Normal breath sounds bilaterally, no wheezing, rales,rhonchi or crepitation. No use of accessory muscles of respiration.  CARDIOVASCULAR: S1, S2 normal. No murmurs, rubs, or gallops.  ABDOMEN: Soft, nontender, nondistended. Bowel sounds present. No organomegaly or mass.  EXTREMITIES: No pedal edema, cyanosis, or clubbing.  NEUROLOGIC: Cranial nerves II through XII are intact. Muscle strength 5/5 in all extremities. Sensation intact. Gait  not checked.  PSYCHIATRIC: The patient is alert and oriented x 3.  SKIN: Stage II sacral and bilateral healed decub ulcers No obvious rash,  LABORATORY PANEL:   CBC  Recent Labs Lab 01/24/16 0339  WBC 6.0  HGB 7.6*  HCT 22.2*  PLT 146*   ------------------------------------------------------------------------------------------------------------------  Chemistries   Recent Labs Lab 01/20/16 2012  01/24/16 0339 01/24/16 1242  NA 133*  < > 146*  --   K 6.7*  < > 4.1  --   CL 104  < > 103  --   CO2 <7*  < > 39*  --   GLUCOSE 68  < > 95  --   BUN 93*  < > 36*  --   CREATININE 7.66*  < > 1.41*  --   CALCIUM 8.7*  < > 6.3*  --   MG 1.5*  --  1.0* 1.6*  AST 17  --   --   --   ALT 14  --   --   --   ALKPHOS 107  --   --   --   BILITOT 1.6*  --   --   --   < > = values in this interval not displayed. ------------------------------------------------------------------------------------------------------------------  Cardiac Enzymes  Recent Labs Lab 01/21/16 1407  TROPONINI 0.03*   ------------------------------------------------------------------------------------------------------------------  RADIOLOGY:  Dg Chest Port 1 View  Result Date: 01/22/2016 CLINICAL DATA:  Cough today. EXAM: PORTABLE CHEST 1 VIEW COMPARISON:  01/20/2016 FINDINGS: Mild linear scarring in the bases, unchanged. No confluent airspace consolidation. No large effusions. Normal pulmonary vasculature. Hilar, mediastinal and cardiac contours are unremarkable and unchanged. Severe chronic rotator cuff arthropathy incidentally  noted bilaterally. IMPRESSION: No acute cardiopulmonary findings. Electronically Signed   By: Ellery Plunk M.D.   On: 01/22/2016 21:55    EKG:   Orders placed or performed during the hospital encounter of 01/20/16  . EKG 12-Lead  . EKG 12-Lead    ASSESSMENT AND PLAN:  Patient is brought in from a nursing facility with low blood pressure and hyperkalemia with T wave  changes. Very hard of hearing  #Failure to thrive with severe protein calorie malnutrition Patient is refusing to eat most of the time She has her sepsis and possible aspiration. Clinical situation is deteriorating Son is considering palliative measures with hospice care as patient is clinically not improving  #Sepsis -meets septic criteria with hypotension and leukocytosis- c diff colitis  vanc PO  Continue IV fluids   blood cultures no growth in 2 days Urine cultures-No growth   # possible aspiration ST eval recommending meds crushed in pure diet     #AKI (acute kidney injury) (HCC) - poor by mouth intake-prerenal leading to ATN Son is considering palliative care with hospice Renal function is  better Baseline 1.03 in September 2017; 7.66-6.81-4.4-1. Renal ultrasound is normal. Foley catheter   appreciate nephrology recommendations  #Hyperkalemia -  resolved   #  Protein-calorie malnutrition, severe -    #Sacral DQ ulcers and bilateral heel ulcers    # HTN (hypertension) -   # Alzheimer's dementia - we will hold her meds     Discussed with pt s son , requesting comfort care if no improvement awaiting palliative care meeting today    Transfer patient to the floor as she is hemodynamically stable All the records are reviewed and case discussed with Care Management/Social Workerr. Management plans discussed with the patient, son  and they are in agreement.  CODE STATUS: dnr  TOTAL TIME TAKING CARE OF THIS PATIENT: 36  minutes.   POSSIBLE D/C IN 2 DAYS, DEPENDING ON CLINICAL CONDITION.  Note: This dictation was prepared with Dragon dictation along with smaller phrase technology. Any transcriptional errors that result from this process are unintentional.   Ramonita Lab M.D on 01/24/2016 at 4:55 PM  Between 7am to 6pm - Pager - [phone] After 6pm go to [email or web address] - password [email or web address] [facility]  [facility]  Office  [phone]  CC: Primary care  physician; Jonathon Bellows, NP

## 2016-01-24 NOTE — Care Management (Signed)
Discussed during progression of the need to identify plan to meet nutritional needs.  Discussed calorie count.  Informed that patient has refused artificial means of nutrition. It is reported that patient was deemed competent by a psychiatrist at [facility].  Discussed this may need to be addressed

## 2016-01-24 NOTE — Progress Notes (Signed)
Family Meeting Note  Advance Directive:yes  Today a meeting took place with the son  via phone in patients room  Patient is unable to participate due LA:GTXMIW capacity demented   The following clinical team members were present during this meeting:MD  The following were discussed:Patient's diagnosis: , Patient's progosis: < 6 months and Goals for treatment: DNR, considering comfort care as there is no clinical improvement. Scheduled a meeting with palliative care today Evening  Additional follow-up to be provided:  hospitalist and palliative care  Time spent during discussion:18 min  Kamarrion Stfort, Deanna Artis, MD

## 2016-01-24 NOTE — Evaluation (Signed)
Occupational Therapy Evaluation Patient Details Name: Ruth Price MRN: [id number] DOB: 1955/03/03 Today's Date: 01/24/2016    History of Present Illness Ruth Price  is a 60 y.o. female who presents with Significant electrolyte derangements and failure to thrive. Patient presents from nursing facility where she was noted to have low blood pressure. Here in the ED she was found to have severe electrolyte derangements including hyperkalemia with T-wave changes, undetectable bicarbonate, and she also had a white blood cell count significantly elevated. Unclear what her source of sepsis may be of this time, though we are waiting urine samples. She also has a wound on her left lower extremity, and her family states she did have septicemia from that wound previously and had a wound VAC on it for a long time, but is not currently having symptoms from it. Family is also concerned that there is possibility that the patient's boyfriend may have been visiting her in the nursing facility and family is concerned that he may provide her alcohol. Family states the patient also has been refusing to eat for the past 2-3 weeks. Her blood pressure was initially low but has responded relatively well to IV fluids. Hospitalists were called for admission and further workup   Clinical Impression   Pt is 60 year old female with complicated history as stated above.  She is easily agitated and confused asking why she was brought here.  She knows her name only.  She is very hard of hearing and states she is deaf.  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 Pt is severely malnourished and weak. She requires maxA+1 for bed mobility and is easily agitated when asked questions.  Session was limited due to patient's level of agitation and NSG gave her Ativan during session.  She has no insight as to why she cannot drink water  and keeps asking even after reasons are told to her.   Pt could benefit from SNF at discharge but potential for progress is questionable at this time.  Pt will benefit from skilled OT services to address deficits in strength, decreased use of BUEs and hands, adaptive equipment to aid in decreased function in B hands, and family ed and training while in hospital to assess participation and progress.  Her son and his wife were in the room at beginning of session and were frustrated that she was not able to retain information discussed as to what brought her into the hospital and left to talk to nurse from Palliative care team.    Follow Up Recommendations  SNF    Equipment Recommendations  Other (comment) (adaptive equip for feeding and ADLs)    Recommendations for Other Services       Precautions / Restrictions Precautions Precautions: Fall Restrictions Weight Bearing Restrictions: No Other Position/Activity Restrictions: Enteric precautions      Mobility Bed Mobility                  Transfers                      Balance                                            ADL Overall ADL's : Needs assistance/impaired Eating/Feeding: Maximal assistance;Set up  Eating/Feeding Details (indicate cue type and reason): limited use of BUE and hands due to arthritis and on aspiration precautions---on honey thickened liquids and soft diet with minimal to no appetite per NSG. Grooming: Wash/dry hands;Wash/dry face;Oral care;Applying deodorant;Brushing hair;Set up;Minimal assistance;Bed level Grooming Details (indicate cue type and reason): extra time needed and assist to open containers and squeeze toothpaste; unable to hold cup to get to mouth with good accuracy; would benefit from adaptied utensils and cup         Upper Body Dressing : Moderate assistance;Set up   Lower Body Dressing: Moderate assistance;Set up                       Vision      Perception     Praxis      Pertinent Vitals/Pain Pain Assessment: No/denies pain     Hand Dominance Right   Extremity/Trunk Assessment Upper Extremity Assessment Upper Extremity Assessment: Generalized weakness RUE Deficits / Details: Pt only able to flex bilateral shoulders approximately 30 degrees. Reports chronic limitation in ROM secondary to "arthritis" with all joints of hands affected by arthritis and has radial deviation and incomplete grasp and release.   Lower Extremity Assessment Lower Extremity Assessment: Defer to PT evaluation       Communication Communication Communication: HOH   Cognition Arousal/Alertness: Awake/alert Behavior During Therapy: Agitated;Flat affect Overall Cognitive Status: Impaired/Different from baseline Area of Impairment: Orientation;Attention;Safety/judgement;Problem solving Orientation Level: Place;Situation;Time Current Attention Level: Focused     Safety/Judgement: Decreased awareness of safety;Decreased awareness of deficits   Problem Solving: Slow processing;Decreased initiation;Requires tactile cues;Requires verbal cues;Difficulty sequencing General Comments: Pt is oriented to person only and is agitated with son and his wife upon OTs arrival and is not able to recall any information family is providing to her.  She asks if she is just here to die, does anyone care and gets agitated when she asks a question and does not get an immediate response.    General Comments       Exercises       Shoulder Instructions      Home Living Family/patient expects to be discharged to:: Skilled nursing facility                                 Additional Comments: Pt does not recall that she was [facility] care or what brought her into the hospital      Prior Functioning/Environment Level of Independence: Needs assistance  Gait / Transfers Assistance Needed: Pt states that she ambulates without assistive device and  then states that she has not walked in a long time ADL's / Homemaking Assistance Needed: daughter apparently helps with bathing, etc Communication / Swallowing Assistance Needed: Pt reports that she is deaf but she can hear if you talk very loudly into her L ear Comments: Pt unable to provide on this date. From prior note 10/2015 pt was previously able to get out of the house occasionally and does stay active with writing, etc        OT Problem List: Decreased strength;Decreased range of motion;Decreased activity tolerance;Decreased coordination;Impaired UE functional use;Decreased safety awareness;Impaired balance (sitting and/or standing)   OT Treatment/Interventions: Self-care/ADL training;Patient/family education;Therapeutic activities;Balance training;DME and/or AE instruction    OT Goals(Current goals can be found in the care plan section) Acute Rehab OT Goals Patient Stated Goal: "to know what the hell I am doing here" OT  Goal Formulation: With patient Time For Goal Achievement: 02/07/16 Potential to Achieve Goals: Good ADL Goals Pt Will Perform Eating: with adaptive utensils;with set-up;with supervision;bed level Pt Will Perform Grooming: with set-up;with adaptive equipment;bed level;with min assist Pt Will Perform Upper Body Dressing: with min assist;sitting Pt Will Perform Lower Body Dressing: with max assist;sit to/from stand;with set-up Pt Will Transfer to Toilet: with +2 assist;with mod assist;stand pivot transfer;bedside commode  OT Frequency: Min 1X/week   Barriers to D/C:            Co-evaluation              End of Session    Activity Tolerance: Treatment limited secondary to agitation Patient left: in bed;with call bell/phone within reach;with bed alarm set   Time: 8295-6213 OT Time Calculation (min): 30 min Charges:  OT General Charges $OT Visit: 1 Procedure OT Evaluation $OT Eval Moderate Complexity: 1 Procedure OT Treatments $Self Care/Home  Management : 8-22 mins G-Codes:    Susanne Borders, OTR/L ascom [phone] 01/24/16, 5:00 PM

## 2016-01-24 NOTE — Progress Notes (Signed)
ANTIBIOTIC CONSULT NOTE - INITIAL  Pharmacy Consult for Cefepime Indication: Sepsis  Allergies  Allergen Reactions  . Levofloxacin Other (See Comments)    Reaction:  Unknown     Patient Measurements: Height: 5\' 3"  (160 cm) Weight: 84 lb 3.5 oz (38.2 kg) IBW/kg (Calculated) : 52.4 Adjusted Body Weight: 49.4 kg   Vital Signs: Temp: 98.5 F (36.9 C) (12/21 0437) Temp Source: Oral (12/21 0437) BP: 107/54 (12/21 0437) Pulse Rate: 62 (12/21 0437) Intake/Output from previous day: 12/20 0701 - 12/21 0700 In: 999.2 [P.O.:50; I.V.:634.2; IV Piggyback:315] Out: 700 [Urine:700] Intake/Output from this shift: No intake/output data recorded.  Labs:  Recent Labs  01/21/16 1800 01/22/16 1533 01/23/16 0542 01/24/16 0339  WBC  --   --   --  6.0  HGB  --   --   --  7.6*  PLT  --   --   --  146*  LABCREA  --  133  --   --   CREATININE 4.48*  --  1.73* 1.41*   Estimated Creatinine Clearance: 24.9 mL/min (by C-G formula based on SCr of 1.41 mg/dL (H)). No results for input(s): VANCOTROUGH, VANCOPEAK, VANCORANDOM, GENTTROUGH, GENTPEAK, GENTRANDOM, TOBRATROUGH, TOBRAPEAK, TOBRARND, AMIKACINPEAK, AMIKACINTROU, AMIKACIN in the last 72 hours.   Microbiology: Recent Results (from the past 720 hour(s))  Culture, blood (Routine X 2) w Reflex to ID Panel     Status: None (Preliminary result)   Collection Time: 01/20/16  9:49 PM  Result Value Ref Range Status   Specimen Description BLOOD LEFT FOREARM  Final   Special Requests BOTTLES DRAWN AEROBIC AND ANAEROBIC 9CCAERO,9CCANA  Final   Culture NO GROWTH 3 DAYS  Final   Report Status PENDING  Incomplete  Culture, blood (Routine X 2) w Reflex to ID Panel     Status: None (Preliminary result)   Collection Time: 01/20/16  9:49 PM  Result Value Ref Range Status   Specimen Description BLOOD LEFT FOREARM  Final   Special Requests BOTTLES DRAWN AEROBIC AND ANAEROBIC [id number],5CCANA  Final   Culture NO GROWTH 3 DAYS  Final   Report Status PENDING   Incomplete  MRSA PCR Screening     Status: None   Collection Time: 01/21/16 12:21 AM  Result Value Ref Range Status   MRSA by PCR NEGATIVE NEGATIVE Final    Comment:        The GeneXpert MRSA Assay (FDA approved for NASAL specimens only), is one component of a comprehensive MRSA colonization surveillance program. It is not intended to diagnose MRSA infection nor to guide or monitor treatment for MRSA infections.   Urine culture     Status: None   Collection Time: 01/21/16 12:57 AM  Result Value Ref Range Status   Specimen Description URINE, RANDOM  Final   Special Requests NONE  Final   Culture NO GROWTH Performed at [facility]   Final   Report Status 01/22/2016 FINAL  Final  C difficile quick scan w PCR reflex     Status: Abnormal   Collection Time: 01/22/16  6:57 PM  Result Value Ref Range Status   C Diff antigen POSITIVE (A) NEGATIVE Final    Comment: CRITICAL RESULT CALLED TO, READ BACK BY AND VERIFIED WITH: LEXIE MILLER AT 2013 01/22/2016 BY TFK.    C Diff toxin POSITIVE (A) NEGATIVE Final   C Diff interpretation Toxin producing C. difficile detected.  Final    Medical History: Past Medical History:  Diagnosis Date  . Alcohol abuse   .  Alzheimer's disease   . Arthropathic psoriasis (HCC)   . Cerebral infarction (HCC)   . Dementia   . Difficulty walking   . Essential hypertension   . Hip pain, chronic, right   . Hyperlipidemia   . Hypertension   . Muscle wasting   . Osteomyelitis (HCC)   . RA (rheumatoid arthritis) (HCC)   . Raynaud's syndrome   . Rheumatoid arthritis (HCC)   . Vascular dementia without behavioral disturbance     Medications:  Prescriptions Prior to Admission  Medication Sig Dispense Refill Last Dose  . aspirin 81 MG chewable tablet Chew 1 tablet (81 mg total) by mouth daily. 30 tablet 2 01/20/2016 at Unknown time  . atorvastatin (LIPITOR) 40 MG tablet Take 40 mg by mouth at bedtime.   01/19/2016 at Unknown time  . B  Complex-C (SUPER B COMPLEX PO) Take 1 capsule by mouth daily.   01/20/2016 at Unknown time  . Biotin 1000 MCG tablet Take 1,000 mcg by mouth daily.   01/20/2016 at Unknown time  . calcium carbonate (OSCAL) 1500 (600 Ca) MG TABS tablet Take 600 mg of elemental calcium by mouth daily with breakfast.   01/20/2016 at Unknown time  . cyclobenzaprine (FLEXERIL) 10 MG tablet Take 10 mg by mouth 2 (two) times daily as needed for muscle spasms.   prn at prn  . donepezil (ARICEPT) 10 MG tablet Take 10 mg by mouth at bedtime.   01/19/2016 at Unknown time  . ferrous sulfate 325 (65 FE) MG tablet Take 325 mg by mouth 3 (three) times daily with meals.   01/20/2016 at Unknown time  . folic acid (FOLVITE) 1 MG tablet Take 1 mg by mouth daily.   01/20/2016 at Unknown time  . Glucosamine-Chondroitin-MSM 750-400-375 MG TABS Take 1 tablet by mouth daily.   01/20/2016 at Unknown time  . lisinopril (PRINIVIL,ZESTRIL) 10 MG tablet Take 1 tablet (10 mg total) by mouth daily. 30 tablet 0 01/20/2016 at Unknown time  . loperamide (IMODIUM) 2 MG capsule Take 2 mg by mouth every 4 (four) hours as needed for diarrhea or loose stools.   prn at prn  . metoprolol tartrate (LOPRESSOR) 25 MG tablet Take 1 tablet (25 mg total) by mouth 2 (two) times daily. 60 tablet 0 01/20/2016 at 1700  . Multiple Vitamin (MULTIVITAMIN WITH MINERALS) TABS tablet Take 1 tablet by mouth daily.   01/20/2016 at Unknown time  . naproxen sodium (ANAPROX) 220 MG tablet Take 440 mg by mouth daily with breakfast.   01/20/2016 at Unknown time  . omega-3 acid ethyl esters (LOVAZA) 1 g capsule Take 2 g by mouth daily.   01/20/2016 at Unknown time  . oxyCODONE-acetaminophen (PERCOCET/ROXICET) 5-325 MG tablet Take 1 tablet by mouth every 4 (four) hours as needed for severe pain. 30 tablet 0 prn at prn  . promethazine (PHENERGAN) 25 MG tablet Take 25 mg by mouth every 6 (six) hours as needed for nausea or vomiting.   prn at prn  . senna-docusate (SENOKOT-S) 8.6-50  MG tablet Take 1 tablet by mouth at bedtime as needed for mild constipation. 30 tablet 1 prn at prn  . sodium chloride (OCEAN) 0.65 % SOLN nasal spray Place 1 spray into both nostrils daily.   01/20/2016 at Unknown time  . valproic acid (DEPAKENE) 250 MG capsule Take 250 mg by mouth 2 (two) times daily.   01/20/2016 at Unknown time  . vitamin C (ASCORBIC ACID) 500 MG tablet Take 500 mg by mouth 2 (two)  times daily.   01/20/2016 at Unknown time   Assessment: Pharmacy consulted to dose vanc and cefepime in this 60 year old female presenting with AKI, muliple electrolyte abnormalities, sepsis.    Goal of Therapy:  Expected duration 10 days with resolution of temperature and/or normalization of WBC   Plan:  Continue cefepime 1 gm IV Q24H. Day 5 abx, will ask to de-escalate.   Patient is also on Vancomycin PO for Cdiff.   12/18 BCx: NGTD 12/18 UCx: neg 12/18 MRSA PCR: neg 12/19 Cdiff: positive  Delsa Bern, PharmD 01/24/2016,7:14 AM

## 2016-01-24 NOTE — Progress Notes (Signed)
Initial Nutrition Assessment  DOCUMENTATION CODES:   Severe malnutrition in context of chronic illness  INTERVENTION:  1. Mighty Shake II BID with Breakfast and dinner, each supplement provides 480-500 kcals and 20-23 grams of protein 2. Patient may require Nutrition Support, though it is documented patient has refused artificial nutrition by case management. PO intake continues to be poor for 3 days now.  NUTRITION DIAGNOSIS:   Malnutrition related to chronic illness as evidenced by severe depletion of muscle mass, severe depletion of body fat, energy intake < or equal to 50% for > or equal to 5 days. -ongoing  GOAL:   Patient will meet greater than or equal to 90% of their needs -not meeting  MONITOR:   PO intake, I & O's, Labs, Weight trends, Supplement acceptance  REASON FOR ASSESSMENT:   Consult, Malnutrition Screening Tool Assessment of nutrition requirement/status  ASSESSMENT:   Ruth Price  is a 60 y.o. female who presents with Significant electrolyte derangements and failure to thrive. Patient presents from nursing facility where she was noted to have low blood pressure.  Patient is more awake, but continues to be confused. Repeatedly asked me for a glass of ice water. No new weights. Awaiting speech eval, patient was too lethargic to perform yesterday. Given patient's current nutrition status in addition to her AMS, her pressure ulcers will only continue to get worse. May need to initiate further discussions about artificial nutrition vs. Palliative approach. Labs and medications reviewed: Na 146, Mg 1.6 Banana Bag NS @ 33mL/hr  Diet Order:  Diet full liquid Room service appropriate? Yes; Fluid consistency: Honey Thick  Skin:  Wound (see comment) (Stg II To Buttocks, Stg III to ankle)  Last BM:  12/18  Height:   Ht Readings from Last 1 Encounters:  01/21/16 5\' 3"  (1.6 m)    Weight:   Wt Readings from Last 1 Encounters:  01/21/16 84 lb 3.5 oz  (38.2 kg)    Ideal Body Weight:  52.27 kg  BMI:  Body mass index is 14.92 kg/m.  Estimated Nutritional Needs:   Kcal:  1000-1200 calories (MSJ x1.1-1.3)  Protein:  45-57 gm  Fluid:  >/= 1L  EDUCATION NEEDS:   No education needs identified at this time  01/23/16. [name], MS, RD LDN Inpatient Clinical Dietitian Pager [phone]

## 2016-01-25 DIAGNOSIS — E43 Unspecified severe protein-calorie malnutrition: Secondary | ICD-10-CM

## 2016-01-25 LAB — CULTURE, BLOOD (ROUTINE X 2)
Culture: NO GROWTH
Culture: NO GROWTH

## 2016-01-25 MED ORDER — MAGIC MOUTHWASH W/LIDOCAINE: 5.0000 mL | Freq: Three times a day (TID) | ORAL | 0 refills | Status: AC | PRN

## 2016-01-25 MED ORDER — ONDANSETRON HCL 4 MG/2ML IJ SOLN: 4.0000 mg | Freq: Four times a day (QID) | INTRAMUSCULAR | 0 refills | Status: AC | PRN

## 2016-01-25 MED ORDER — ACETAMINOPHEN 325 MG PO TABS: 650.0000 mg | ORAL_TABLET | Freq: Four times a day (QID) | ORAL | Status: AC | PRN

## 2016-01-25 MED ORDER — MORPHINE SULFATE (CONCENTRATE) 10 MG/0.5ML PO SOLN: 5.0000 mg | ORAL | 0 refills | Status: AC | PRN

## 2016-01-25 MED ORDER — LORAZEPAM 2 MG/ML IJ SOLN: 1.0000 mg | INTRAMUSCULAR | 0 refills | Status: AC | PRN

## 2016-01-25 NOTE — Progress Notes (Signed)
EMS for transport. Family has gone to [facility]. Clydie Braun and Minerva Areola have completed discharge paperwork and packet. IV and telemetry removed. Patient resting quietly, no distress.

## 2016-01-25 NOTE — Progress Notes (Signed)
New hospice home referral received from [place]. Ms. Asbill is a 60 year old woman admitted to [facility] on 12/7 for evaluation of hyperkalemia and low blood pressure. She has a PMH of dementia, ETOH abuse, rheumatoid arthritis, HTN, HLD and raynaud's syndrome. In ED, she was found to have severe electrolyte imbalance including hyperkalemia with t-wave changes, undetectable bicarbonate, and elevated white blood cell count. She is also suffering from severe protein calorie malnutrition. Psychiatry was consulted as patient has continued to refuse to eat. She is also positive for C-diff. Family met with Medicine NP Ihor Dow and have chosen to focus on comfort with transfer to the hospice home. Patient is currently only on comfort medications. She required a 2 mg dose of lorazepam overnight as well as 2 PRN doses of liquid morphine. Per chart note review and discussion with staff, patient has been very agitated. She is also very hard of hearing. Writer met in the family room with patient's son Broadus John and his wife Museum/gallery conservator to initiate education regarding hospice services, philosophy and team approach to care with good understanding voiced. Questions answered, consents signed. Patient seen sitting up in bed, alert, very hard of hearing, family at bedside attempting to communicate with patient. They were able to share the discharge plan with her and she was agreeable.  Patient information faxed to referral. Report called to the hospice home, EMS notified for transport. Hospital care team and family all aware. Signed DNR in place in patient's discharge packet. Thank you for the opportunity to be involved in the care of this patient and her family.  Flo Shanks RN, BSN, [facility] [place], hospital liaison [phone] c

## 2016-01-25 NOTE — Clinical Social Work Note (Addendum)
CSW attempted to contact patient's son to discuss residential hospice options.  CSW left a message on voice mail, awaiting call back from patient's son Ruth Price.  11:45am CSW attempted to call patient's son again to discuss hospice facility placement, left a message again for patient's son.  12:35pm  CSW contacted patient's daughter in law who was able to have son speak to CSW.  CSW spoke to patient's son and he requested [facility].  CSW contacted Hospice Home nurse liason and gave her referral for hospice home.  CSW to continue to follow patient's progress throughout discharge planning.  2:50pm  Patient to be d/c'ed today to [name] .  Patient and family agreeable to plans will transport via ems [facility] liason RN to call report.  [name]. [name], MSW, [name] [phone]  Mon-Fri 8a-4:30p 01/25/2016 8:55 AM

## 2016-01-25 NOTE — Discharge Summary (Signed)
[facility] - [place] at [facility]   PATIENT NAME: Ruth Price    MR#:  [id number]  DATE OF BIRTH:  Sep 09, 1955  DATE OF ADMISSION:  01/20/2016 ADMITTING PHYSICIAN: Oralia Manis, MD  DATE OF DISCHARGE: 01/25/2016  PRIMARY CARE PHYSICIAN: Jonathon Bellows, NP    ADMISSION DIAGNOSIS:  Acute hyperkalemia [E87.5] Metabolic acidosis [E87.2] Acute encephalopathy [G93.40] Severe sepsis (HCC) [A41.9, R65.20] Acute renal failure, unspecified acute renal failure type (HCC) [N17.9]  DISCHARGE DIAGNOSIS:     SECONDARY DIAGNOSIS:   Past Medical History:  Diagnosis Date  . Alcohol abuse   . Alzheimer's disease   . Arthropathic psoriasis (HCC)   . Cerebral infarction (HCC)   . Dementia   . Difficulty walking   . Essential hypertension   . Hip pain, chronic, right   . Hyperlipidemia   . Hypertension   . Muscle wasting   . Osteomyelitis (HCC)   . RA (rheumatoid arthritis) (HCC)   . Raynaud's syndrome   . Rheumatoid arthritis (HCC)   . Vascular dementia without behavioral disturbance     HOSPITAL COURSE:   Patient is brought in from a nursing facility with low blood pressure and hyperkalemia with T wave changes. Very hard of hearing  #Failure to thrive with severe protein calorie malnutrition Patient is refusing to eat most of the time She has  sepsis and possible aspiration. Clinical situation is deteriorating Son opted palliative measures with hospice care as patient is clinically not improving Patient will be transferred to hospice facility today. Appreciate hospice/ palliative care assistance  #Sepsis -meets septic criteria with hypotension and leukocytosis- c diff colitis  vanc PO which was given during the hospital course is discontinued Continue IV fluids   blood cultures no growth in 2 days Urine cultures-No growth   # possible aspiration ST eval recommending meds crushed in pure diet   #AKI (acute kidney injury) (HCC) - poor  by mouth intake-prerenal leading to ATN Son has opted palliative care with hospice Renal function is  better   #Hyperkalemia -  resolved   #Protein-calorie malnutrition, severe -    #Sacral DQ ulcers and bilateral heel ulcers    #HTN (hypertension) -  #Alzheimer's dementia - we will hold her meds    Transfer patient to hospice care facility  DISCHARGE CONDITIONS:   gaurded  CONSULTS OBTAINED:  Treatment Team:  Audery Amel, MD   PROCEDURES none  DRUG ALLERGIES:   Allergies  Allergen Reactions  . Levofloxacin Other (See Comments)    Reaction:  Unknown     DISCHARGE MEDICATIONS:   Current Discharge Medication List    START taking these medications   Details  acetaminophen (TYLENOL) 325 MG tablet Take 2 tablets (650 mg total) by mouth every 6 (six) hours as needed for mild pain (or Fever >/= 101).    LORazepam (ATIVAN) 2 MG/ML injection Inject 0.5-1 mLs (1-2 mg total) into the vein every 4 (four) hours as needed for anxiety (agitation). Qty: 1 mL, Refills: 0    magic mouthwash w/lidocaine SOLN Take 5 mLs by mouth 3 (three) times daily as needed for mouth pain. Qty: 20 mL, Refills: 0    Morphine Sulfate (MORPHINE CONCENTRATE) 10 MG/0.5ML SOLN concentrated solution Take 0.25 mLs (5 mg total) by mouth every hour as needed for moderate pain, severe pain or shortness of breath (dyspnea). Qty: 20 mL, Refills: 0    ondansetron (ZOFRAN) 4 MG/2ML SOLN injection Inject 2 mLs (4 mg total) into the vein  every 6 (six) hours as needed for nausea. Qty: 2 mL, Refills: 0      CONTINUE these medications which have NOT CHANGED   Details  loperamide (IMODIUM) 2 MG capsule Take 2 mg by mouth every 4 (four) hours as needed for diarrhea or loose stools.      STOP taking these medications     aspirin 81 MG chewable tablet      atorvastatin (LIPITOR) 40 MG tablet      B Complex-C (SUPER B COMPLEX PO)      Biotin 1000 MCG tablet      calcium carbonate  (OSCAL) 1500 (600 Ca) MG TABS tablet      cyclobenzaprine (FLEXERIL) 10 MG tablet      donepezil (ARICEPT) 10 MG tablet      ferrous sulfate 325 (65 FE) MG tablet      folic acid (FOLVITE) 1 MG tablet      Glucosamine-Chondroitin-MSM 750-400-375 MG TABS      lisinopril (PRINIVIL,ZESTRIL) 10 MG tablet      metoprolol tartrate (LOPRESSOR) 25 MG tablet      Multiple Vitamin (MULTIVITAMIN WITH MINERALS) TABS tablet      naproxen sodium (ANAPROX) 220 MG tablet      omega-3 acid ethyl esters (LOVAZA) 1 g capsule      oxyCODONE-acetaminophen (PERCOCET/ROXICET) 5-325 MG tablet      promethazine (PHENERGAN) 25 MG tablet      senna-docusate (SENOKOT-S) 8.6-50 MG tablet      sodium chloride (OCEAN) 0.65 % SOLN nasal spray      valproic acid (DEPAKENE) 250 MG capsule      vitamin C (ASCORBIC ACID) 500 MG tablet          DISCHARGE INSTRUCTIONS:   Follow-up with hospice care physician  DIET:  As tolerated  DISCHARGE CONDITION:  gaurded  ACTIVITY:  Bedrest  OXYGEN:  Home Oxygen: Yes.     Oxygen Delivery: 2 liters/min via Patient connected to nasal cannula oxygen pr n for comfort  DISCHARGE LOCATION:  Hospice facility   If you experience worsening of your admission symptoms, develop shortness of breath, life threatening emergency, suicidal or homicidal thoughts you must seek medical attention immediately by calling 911 or calling your MD immediately  if symptoms less severe.  You Must read complete instructions/literature along with all the possible adverse reactions/side effects for all the Medicines you take and that have been prescribed to you. Take any new Medicines after you have completely understood and accpet all the possible adverse reactions/side effects.   Please note  You were cared for by a hospitalist during your hospital stay. If you have any questions about your discharge medications or the care you received while you were in the hospital after you are  discharged, you can call the unit and asked to speak with the hospitalist on call if the hospitalist that took care of you is not available. Once you are discharged, your primary care physician will handle any further medical issues. Please note that NO REFILLS for any discharge medications will be authorized once you are discharged, as it is imperative that you return to your primary care physician (or establish a relationship with a primary care physician if you do not have one) for your aftercare needs so that they can reassess your need for medications and monitor your lab values.     Today  Chief Complaint  Patient presents with  . Hypotension  . Failure To Thrive   Patient wants  orange juice.  ROS: Limited CONSTITUTIONAL: Denies fevers, chills. Denies any fatigue, weakness.  RESPIRATORY: Denies cough, wheeze, shortness of breath.  CARDIOVASCULAR: Denies chest pain, palpitations, edema.  GASTROINTESTINAL: Denies nausea, vomiting, diarrhea, abdominal pain. Denies bright red blood per rectum. MUSCULOSKELETAL: Denies pain in neck, back, shoulder, knees, hips or arthritic symptoms.    VITAL SIGNS:  Blood pressure (!) 146/72, pulse 86, temperature 98.1 F (36.7 C), resp. rate 16, height 5\' 3"  (1.6 m), weight 38.2 kg (84 lb 3.5 oz), SpO2 98 %.  I/O:    Intake/Output Summary (Last 24 hours) at 01/25/16 1319 Last data filed at 01/24/16 1813  Gross per 24 hour  Intake                0 ml  Output              250 ml  Net             -250 ml    PHYSICAL EXAMINATION:  GENERAL:  60 y.o.-year-old patient lying in the bed with no acute distress.  EYES: Pupils equal, round, reactive to light and accommodation. No scleral icterus. Extraocular muscles intact.  HEENT: Head atraumatic, normocephalic. Oropharynx and nasopharynx clear.  NECK:  Supple, no jugular venous distention. No thyroid enlargement, no tenderness.  LUNGS: Normal breath sounds bilaterally, no wheezing, rales,rhonchi or  crepitation. No use of accessory muscles of respiration.  CARDIOVASCULAR: S1, S2 normal. No murmurs, rubs, or gallops.  ABDOMEN: Soft, non-tender, non-distended. Bowel sounds present. No organomegaly or mass.  EXTREMITIES: No pedal edema, cyanosis, or clubbing.  NEUROLOGIC: Cranial nerves II through XII are intact. Muscle strength 5/5 in all extremities. Sensation intact. Gait not checked.  PSYCHIATRIC: The patient is alert and oriented x 3.  SKIN: No obvious rash, lesion, or ulcer.   DATA REVIEW:   CBC  Recent Labs Lab 01/24/16 0339  WBC 6.0  HGB 7.6*  HCT 22.2*  PLT 146*    Chemistries   Recent Labs Lab 01/20/16 2012  01/24/16 0339 01/24/16 1242  NA 133*  < > 146*  --   K 6.7*  < > 4.1  --   CL 104  < > 103  --   CO2 <7*  < > 39*  --   GLUCOSE 68  < > 95  --   BUN 93*  < > 36*  --   CREATININE 7.66*  < > 1.41*  --   CALCIUM 8.7*  < > 6.3*  --   MG 1.5*  --  1.0* 1.6*  AST 17  --   --   --   ALT 14  --   --   --   ALKPHOS 107  --   --   --   BILITOT 1.6*  --   --   --   < > = values in this interval not displayed.  Cardiac Enzymes  Recent Labs Lab 01/21/16 1407  TROPONINI 0.03*    Microbiology Results  Results for orders placed or performed during the hospital encounter of 01/20/16  Culture, blood (Routine X 2) w Reflex to ID Panel     Status: None   Collection Time: 01/20/16  9:49 PM  Result Value Ref Range Status   Specimen Description BLOOD LEFT FOREARM  Final   Special Requests BOTTLES DRAWN AEROBIC AND ANAEROBIC [id number],[id number]  Final   Culture NO GROWTH 5 DAYS  Final   Report Status 01/25/2016 FINAL  Final  Culture, blood (Routine X  2) w Reflex to ID Panel     Status: None   Collection Time: 01/20/16  9:49 PM  Result Value Ref Range Status   Specimen Description BLOOD LEFT FOREARM  Final   Special Requests BOTTLES DRAWN AEROBIC AND ANAEROBIC [id number],5CCANA  Final   Culture NO GROWTH 5 DAYS  Final   Report Status 01/25/2016 FINAL  Final  MRSA  PCR Screening     Status: None   Collection Time: 01/21/16 12:21 AM  Result Value Ref Range Status   MRSA by PCR NEGATIVE NEGATIVE Final    Comment:        The GeneXpert MRSA Assay (FDA approved for NASAL specimens only), is one component of a comprehensive MRSA colonization surveillance program. It is not intended to diagnose MRSA infection nor to guide or monitor treatment for MRSA infections.   Urine culture     Status: None   Collection Time: 01/21/16 12:57 AM  Result Value Ref Range Status   Specimen Description URINE, RANDOM  Final   Special Requests NONE  Final   Culture NO GROWTH Performed at [facility]   Final   Report Status 01/22/2016 FINAL  Final  C difficile quick scan w PCR reflex     Status: Abnormal   Collection Time: 01/22/16  6:57 PM  Result Value Ref Range Status   C Diff antigen POSITIVE (A) NEGATIVE Final    Comment: CRITICAL RESULT CALLED TO, READ BACK BY AND VERIFIED WITH: LEXIE MILLER AT 2013 01/22/2016 BY TFK.    C Diff toxin POSITIVE (A) NEGATIVE Final   C Diff interpretation Toxin producing C. difficile detected.  Final    RADIOLOGY:  US Renal  Result Date: 01/22/2016 CLINICAL DATA:  Acute renal failure. EXAM: RENAL / URINARY TRACT ULTRASOUND COMPLETE COMPARISON:  None. FINDINGS: Right Kidney: Length: 9.1 cm. Increased echogenicity consistent with chronic medical renal disease. No mass or hydronephrosis. Left Kidney: Length: 10.2 cm. Increased echogenicity consistent chronic medical renal disease. No mass or hydronephrosis. 7 mm shadowing nonobstructing renal calyceal stones are noted. Bladder: Foley catheter noted.  Trace ascites. IMPRESSION: 1.  Increased echogenicity consistent chronic medical renal disease. 2. Nonobstructing left renal calyceal stones. No evidence of hydronephrosis. Bladder is nondistended. Foley catheter present bladder. 3. Trace ascites. Electronically Signed   By: Maisie Fus  Register   On: 01/22/2016 11:17   Dg Chest  Port 1 View  Result Date: 01/22/2016 CLINICAL DATA:  Cough today. EXAM: PORTABLE CHEST 1 VIEW COMPARISON:  01/20/2016 FINDINGS: Mild linear scarring in the bases, unchanged. No confluent airspace consolidation. No large effusions. Normal pulmonary vasculature. Hilar, mediastinal and cardiac contours are unremarkable and unchanged. Severe chronic rotator cuff arthropathy incidentally noted bilaterally. IMPRESSION: No acute cardiopulmonary findings. Electronically Signed   By: Ellery Plunk M.D.   On: 01/22/2016 21:55    EKG:   Orders placed or performed during the hospital encounter of 01/20/16  . EKG 12-Lead  . EKG 12-Lead      Management plans discussed with the patient, family and they are in agreement.  CODE STATUS:     Code Status Orders        Start     Ordered   01/21/16 0018  Do not attempt resuscitation (DNR)  Continuous    Question Answer Comment  In the event of cardiac or respiratory ARREST Do not call a "code blue"   In the event of cardiac or respiratory ARREST Do not perform Intubation, CPR, defibrillation or ACLS   In  the event of cardiac or respiratory ARREST Use medication by any route, position, wound care, and other measures to relive pain and suffering. May use oxygen, suction and manual treatment of airway obstruction as needed for comfort.      01/21/16 0017    Code Status History    Date Active Date Inactive Code Status Order ID Comments User Context   01/20/2016 10:19 PM 01/21/2016 12:17 AM DNR [id number]  Willy EddyPatrick Robinson, MD ED   10/27/2015  9:13 PM 11/02/2015  9:18 PM Full Code [id number]  Tonye RoyaltyAlexis Hugelmeyer, DO Inpatient   06/08/2015  2:45 PM 06/09/2015  4:36 PM Full Code [id number]  Shaune PollackQing Chen, MD ED      TOTAL TIME TAKING CARE OF THIS PATIENT: 42 minutes.   Note: This dictation was prepared with Dragon dictation along with smaller phrase technology. Any transcriptional errors that result from this process are unintentional.   @[facility] @  on 01/25/2016  at 1:19 PM  Between 7am to 6pm - Pager - [phone]  After 6pm go to [email or web address] - password [email or web address] [facility]  [facility]  Office  [phone]  CC: Primary care physician; Jonathon BellowsALCALA, BETH C, NP

## 2016-01-25 NOTE — Progress Notes (Signed)
Daily Progress Note   Patient Name: Ruth Price       Date: 01/25/2016 DOB: 05/21/1955  Age: 60 y.o. MRN#: [id number] Attending Physician: Ramonita Lab, MD Primary Care Physician: Jonathon Bellows, NP Admit Date: 01/20/2016  Reason for Consultation/Follow-up: Establishing goals of care  Subjective: Upon arrival to room, patient resting comfortably. Patient wakes to touch. No signs or symptoms of distress. Some shallow breathing. Stable for transfer to hospice home today if bed available.   Length of Stay: 5  Current Medications: Scheduled Meds:  . sodium chloride flush  3 mL Intravenous Q12H    Continuous Infusions:  PRN Meds: acetaminophen **OR** acetaminophen, LORazepam, magic mouthwash w/lidocaine, morphine injection, morphine CONCENTRATE, [DISCONTINUED] ondansetron **OR** ondansetron (ZOFRAN) IV, senna  Physical Exam  Constitutional: She appears cachectic. She is sleeping. She appears ill.  HENT:  Head: Normocephalic and atraumatic.  Cardiovascular: Regular rhythm.   Murmur heard. Pulmonary/Chest: Effort normal. She has decreased breath sounds.  Shallow breathing  Abdominal: Normal appearance.  Neurological: She is disoriented.  Skin: Skin is warm and dry. There is pallor.  Psychiatric: Her speech is delayed. Cognition and memory are impaired.  Resting comfortably during assessment She is inattentive.  Nursing note and vitals reviewed.          Vital Signs: BP (!) 143/65 (BP Location: Left Arm)   Pulse 88   Temp 98 F (36.7 C)   Resp 16   Ht 5\' 3"  (1.6 m)   Wt 38.2 kg (84 lb 3.5 oz)   SpO2 98%   BMI 14.92 kg/m  SpO2: SpO2: 98 % O2 Device: O2 Device: Nasal Cannula O2 Flow Rate: O2 Flow Rate (L/min): 4 L/min  Intake/output summary:   Intake/Output Summary  (Last 24 hours) at 01/25/16 0841 Last data filed at 01/24/16 1813  Gross per 24 hour  Intake               60 ml  Output              300 ml  Net             -240 ml   LBM: Last BM Date: 01/24/16 Baseline Weight: Weight: 37.9 kg (83 lb 8 oz) Most recent weight: Weight: 38.2 kg (84 lb 3.5 oz)       Palliative Assessment/Data:  PPS 20%   Flowsheet Rows   Flowsheet Row Most Recent Value  Intake Tab  Referral Department  Hospitalist  Unit at Time of Referral  Med/Surg Unit  Palliative Care Primary Diagnosis  Sepsis/Infectious Disease  Date Notified  01/21/16  Palliative Care Type  New Palliative care  Reason for referral  Clarify Goals of Care  Date of Admission  01/20/16  Date first seen by Palliative Care  01/22/16  # of days Palliative referral response time  2 Day(s)  # of days IP prior to Palliative referral  1  Clinical Assessment  Palliative Performance Scale Score  20%  Psychosocial & Spiritual Assessment  Palliative Care Outcomes  Patient/Family meeting held?  Yes  Who was at the meeting?  son and daughter-in-law  Palliative Care Outcomes  Clarified goals of care, Provided end of life care assistance, Provided psychosocial or spiritual support, Changed to focus on comfort, Counseled regarding hospice, Improved pain interventions, Improved non-pain symptom therapy      Patient Active Problem List   Diagnosis Date Noted  . Palliative care encounter   . Encounter for hospice care discussion   . Comfort measures only status   . Anxiety state   . Adult failure to thrive   . Pressure injury of skin 01/21/2016  . Acute delirium 01/21/2016  . Septic shock (HCC) 01/20/2016  . AKI (acute kidney injury) (HCC) 01/20/2016  . HTN (hypertension) 01/20/2016  . RA (rheumatoid arthritis) (HCC) 01/20/2016  . HLD (hyperlipidemia) 01/20/2016  . Severe sepsis (HCC) 01/20/2016  . Hyperkalemia 01/20/2016  . Protein-calorie malnutrition, severe 10/30/2015  . Alzheimer's dementia  10/27/2015  . Alcohol abuse 10/27/2015  . Osteomyelitis of ankle (HCC) 10/27/2015  . CVA (cerebral infarction) 06/08/2015    Palliative Care Assessment & Plan   Patient Profile: 61 y.o. female  with past medical history of vascular dementia, rheumatoid arthritis, osteomyelitis, hypertension, hyperlipidemia, and ETOH abuse admitted on 01/20/2016 with failure to thrive and low blood pressure. In ED, she was found to have severe electrolyte imbalance including hyperkalemia with t-wave changes, undetectable bicarbonate, and elevated white blood cell count. Per family, left lower extremity wound with septicemia and wound vac a few months ago. Acute kidney injury and on bicarb drip. Severe protein-calorie malnutrition-nutrition and psych consulted since patient has been refusing to eat per family. Hx of ETOH abuse and son suspicious that boyfriend was taking alcohol to her in the nursing facility. Palliative medicine consultation for goals of care.   Assessment: Failure to thrive Severe protein calorie malnutrition Sepsis Acute kidney injury Sacral decubitus ulcer  Bilateral heel ulcers Dementia  Recommendations/Plan:  DNR/DNI  Comfort measures only.  Symptom management  Roxanol 5mg  PO q1h prn pain/dyspnea/air hunger  May give Morphine 1mg  IV q1h prn if patient refusing to take oral  Ativan 1-2mg  IV q4h prn anxiety/agitation  Senna 1 tab PO prn constipation  Allow patient to eat/drink what she asks for, including thin liquids. Family understands high risk for aspiration.  Social work consulted for residential hospice. Stable to transfer today if bed available.   Goals of Care and Additional Recommendations:  Limitations on Scope of Treatment: Full Comfort Care  Code Status: DNR   Code Status Orders        Start     Ordered   01/21/16 0018  Do not attempt resuscitation (DNR)  Continuous    Question Answer Comment  In the event of cardiac or respiratory ARREST Do not  call a "code blue"  In the event of cardiac or respiratory ARREST Do not perform Intubation, CPR, defibrillation or ACLS   In the event of cardiac or respiratory ARREST Use medication by any route, position, wound care, and other measures to relive pain and suffering. May use oxygen, suction and manual treatment of airway obstruction as needed for comfort.      01/21/16 0017    Code Status History    Date Active Date Inactive Code Status Order ID Comments User Context   01/20/2016 10:19 PM 01/21/2016 12:17 AM DNR [id number]  Willy Eddy, MD ED   10/27/2015  9:13 PM 11/02/2015  9:18 PM Full Code [id number]  Tonye Royalty, DO Inpatient   06/08/2015  2:45 PM 06/09/2015  4:36 PM Full Code [id number]  Shaune Pollack, MD ED       Prognosis:   < 2 weeks-severe malnutrition, high risk for aspiration, and overall failure to thrive.   Discharge Planning:  Hospice facility  Care plan was discussed with son, daughter-in-law, RN, and Dr. Amado Coe  Thank you for allowing the Palliative Medicine Team to assist in the care of this patient.   Time In: 0815 Time Out: 0840 Total Time Prolonged Time Billed  no       Greater than 50%  of this time was spent counseling and coordinating care related to the above assessment and plan.  Vennie Homans, FNP-C Palliative Medicine Team  Phone: [phone] Fax: [phone]  Please contact Palliative Medicine Team phone at [phone] for questions and concerns.

## 2016-01-25 NOTE — Progress Notes (Signed)
Nutrition Brief Note  Chart reviewed and discussed with RN. Patient now transitioning to comfort care.   No further nutrition interventions warranted at this time.  Please re-consult as needed.   Helane Rima, MS, RD, LDN Pager: [phone] After Hours Pager: [phone]

## 2016-01-27 LAB — C3 COMPLEMENT: C3 COMPLEMENT: 21 mg/dL — AB (ref 82–167)

## 2016-01-27 LAB — C4 COMPLEMENT: COMPLEMENT C4, BODY FLUID: 2 mg/dL — AB (ref 14–44)

## 2016-01-29 LAB — MPO/PR-3 (ANCA) ANTIBODIES: Myeloperoxidase Abs: <9 U/mL (ref 0.0–9.0)

## 2016-01-30 LAB — PROTEIN ELECTRO, RANDOM URINE
ALBUMIN ELP UR: 10.8 %
ALPHA-1-GLOBULIN, U: 2.6 %
Alpha-2-Globulin, U: 19.6 %
Beta Globulin, U: 21.6 %
Gamma Globulin, U: 45.4 %
PDF-U24IEL: 0
TOTAL PROTEIN, URINE-UPE24: 54.6 mg/dL

## 2016-02-04 DEATH — deceased

## 2016-03-12 ENCOUNTER — Encounter: Payer: Self-pay | Admitting: *Deleted

## 2017-12-27 IMAGING — CR DG HIP (WITH OR WITHOUT PELVIS) 2-3V*R*
3 series · 3 of 3 positions shown · non-contrast
Comparison: None.

CLINICAL DATA: Fall.  Initial evaluation.

EXAM:
DG HIP (WITH OR WITHOUT PELVIS) 2-3V RIGHT

[pelvis ap]
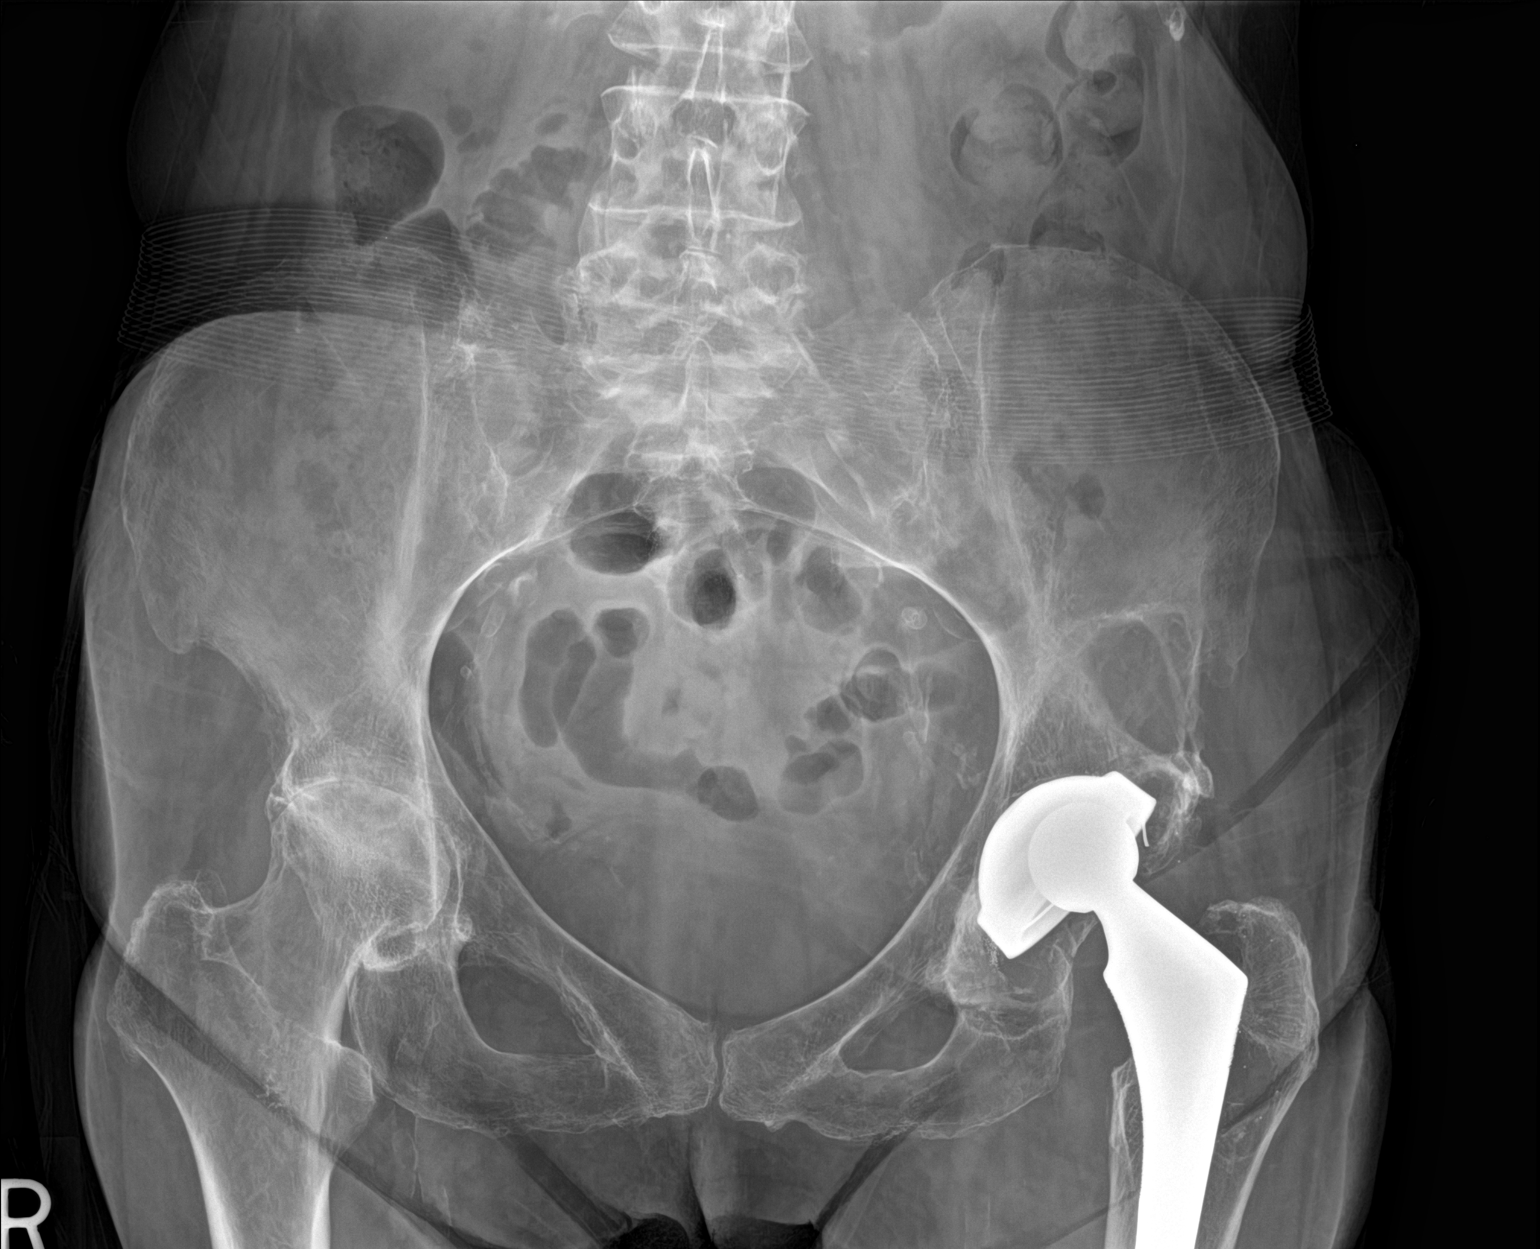

[hip ap]
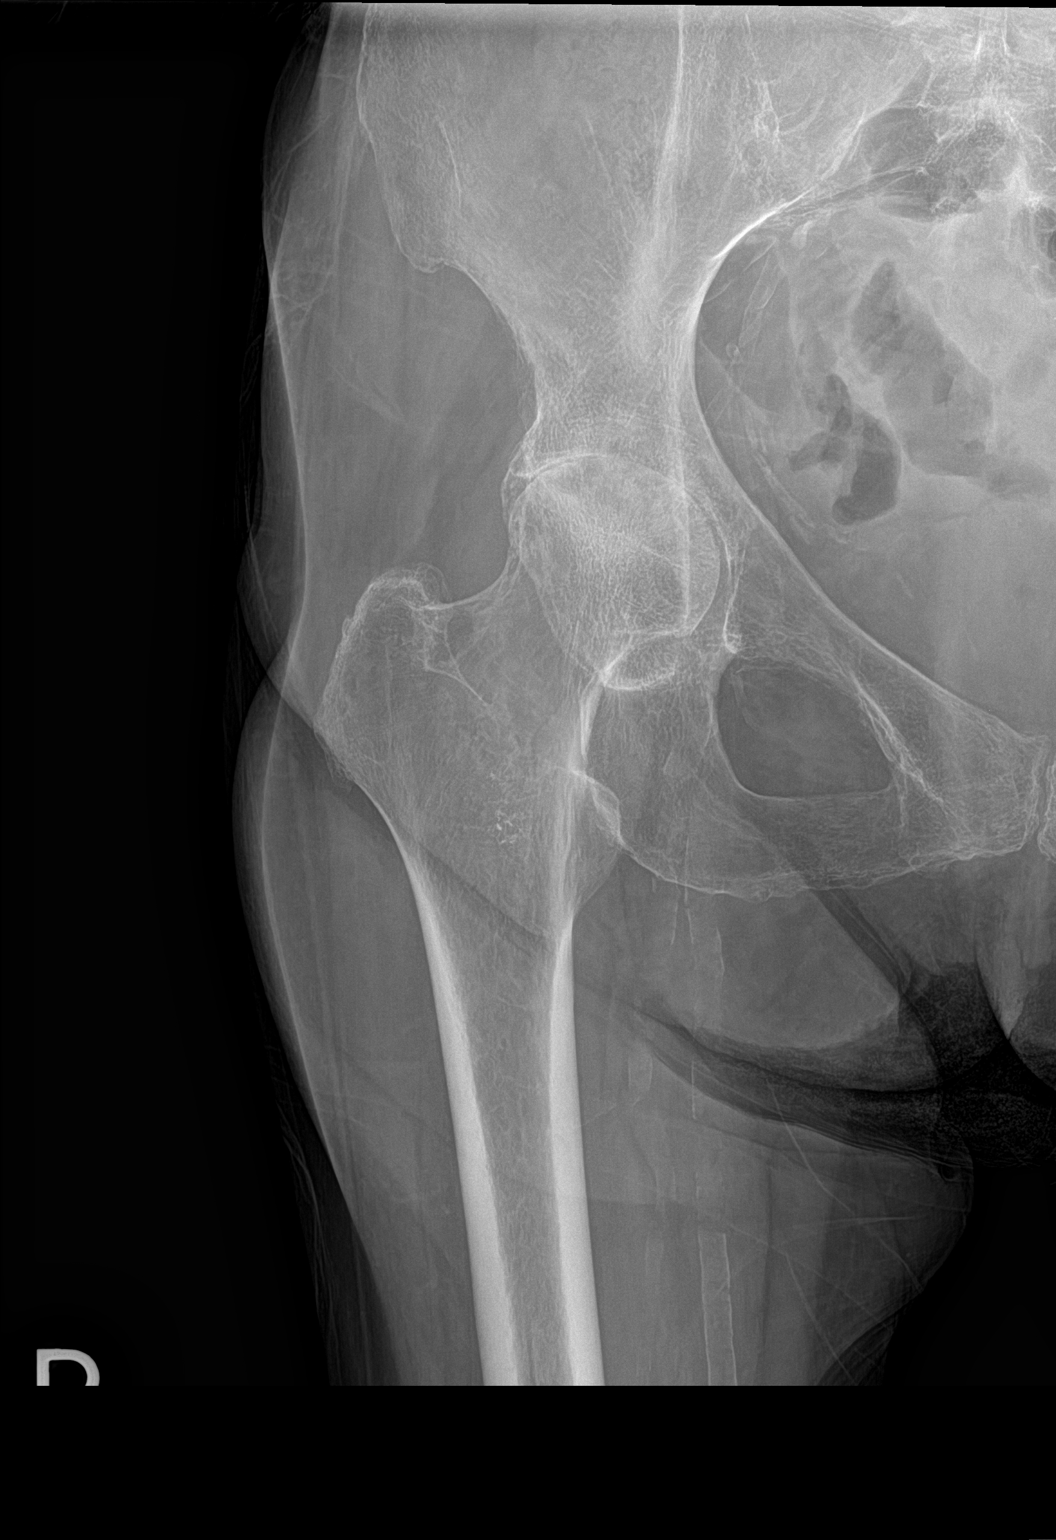

[hip lat]
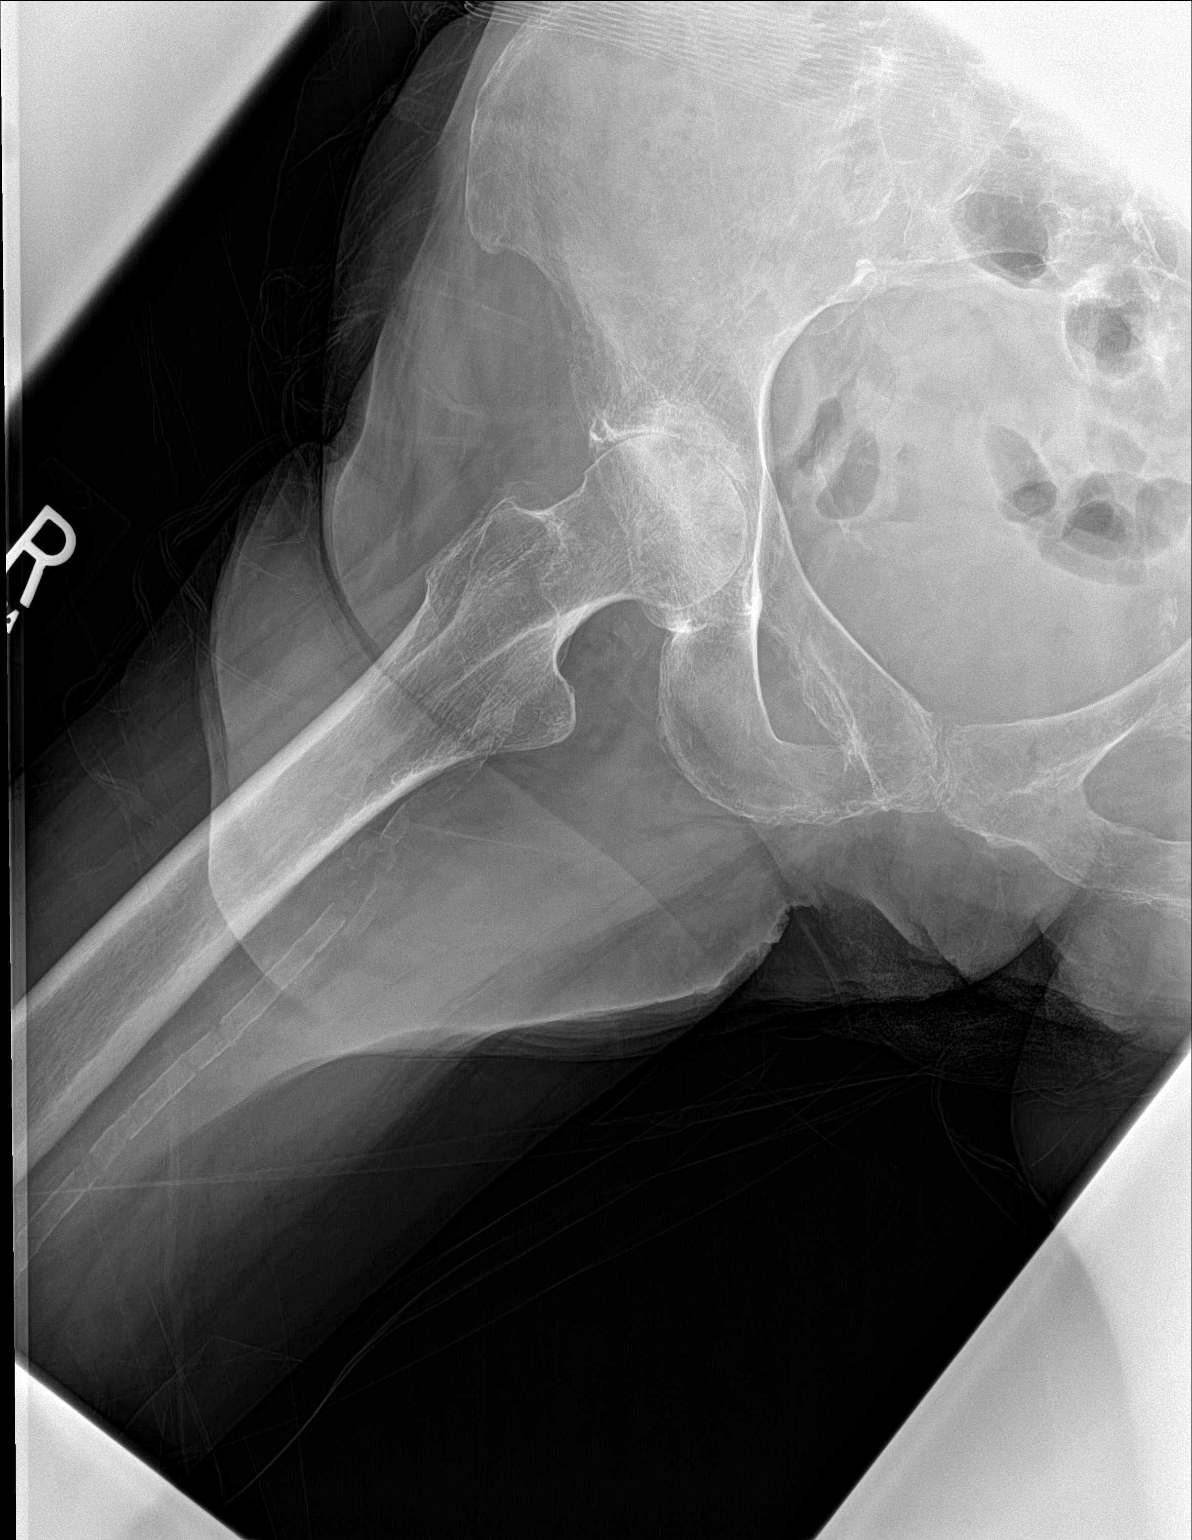

[3 of 3 positions shown; findings below may reference images not displayed]

FINDINGS: Degenerative changes lumbar spine and right hip. Left hip
replacement. Diffuse osteopenia. No acute bony abnormality.
Aortoiliac atherosclerotic vascular calcification.
IMPRESSION: 1. Degenerative changes lumbar spine and right hip. Diffuse
osteopenia. No acute bony abnormality identified.
2. Total left hip replacement.
3. Peripheral vascular disease.

## 2018-09-09 IMAGING — DX DG CHEST 1V PORT
1 series · 1 of 1 positions shown · non-contrast
Comparison: 06/08/2015

CLINICAL DATA: PICC line placement

EXAM:
PORTABLE CHEST 1 VIEW

[chest ap]
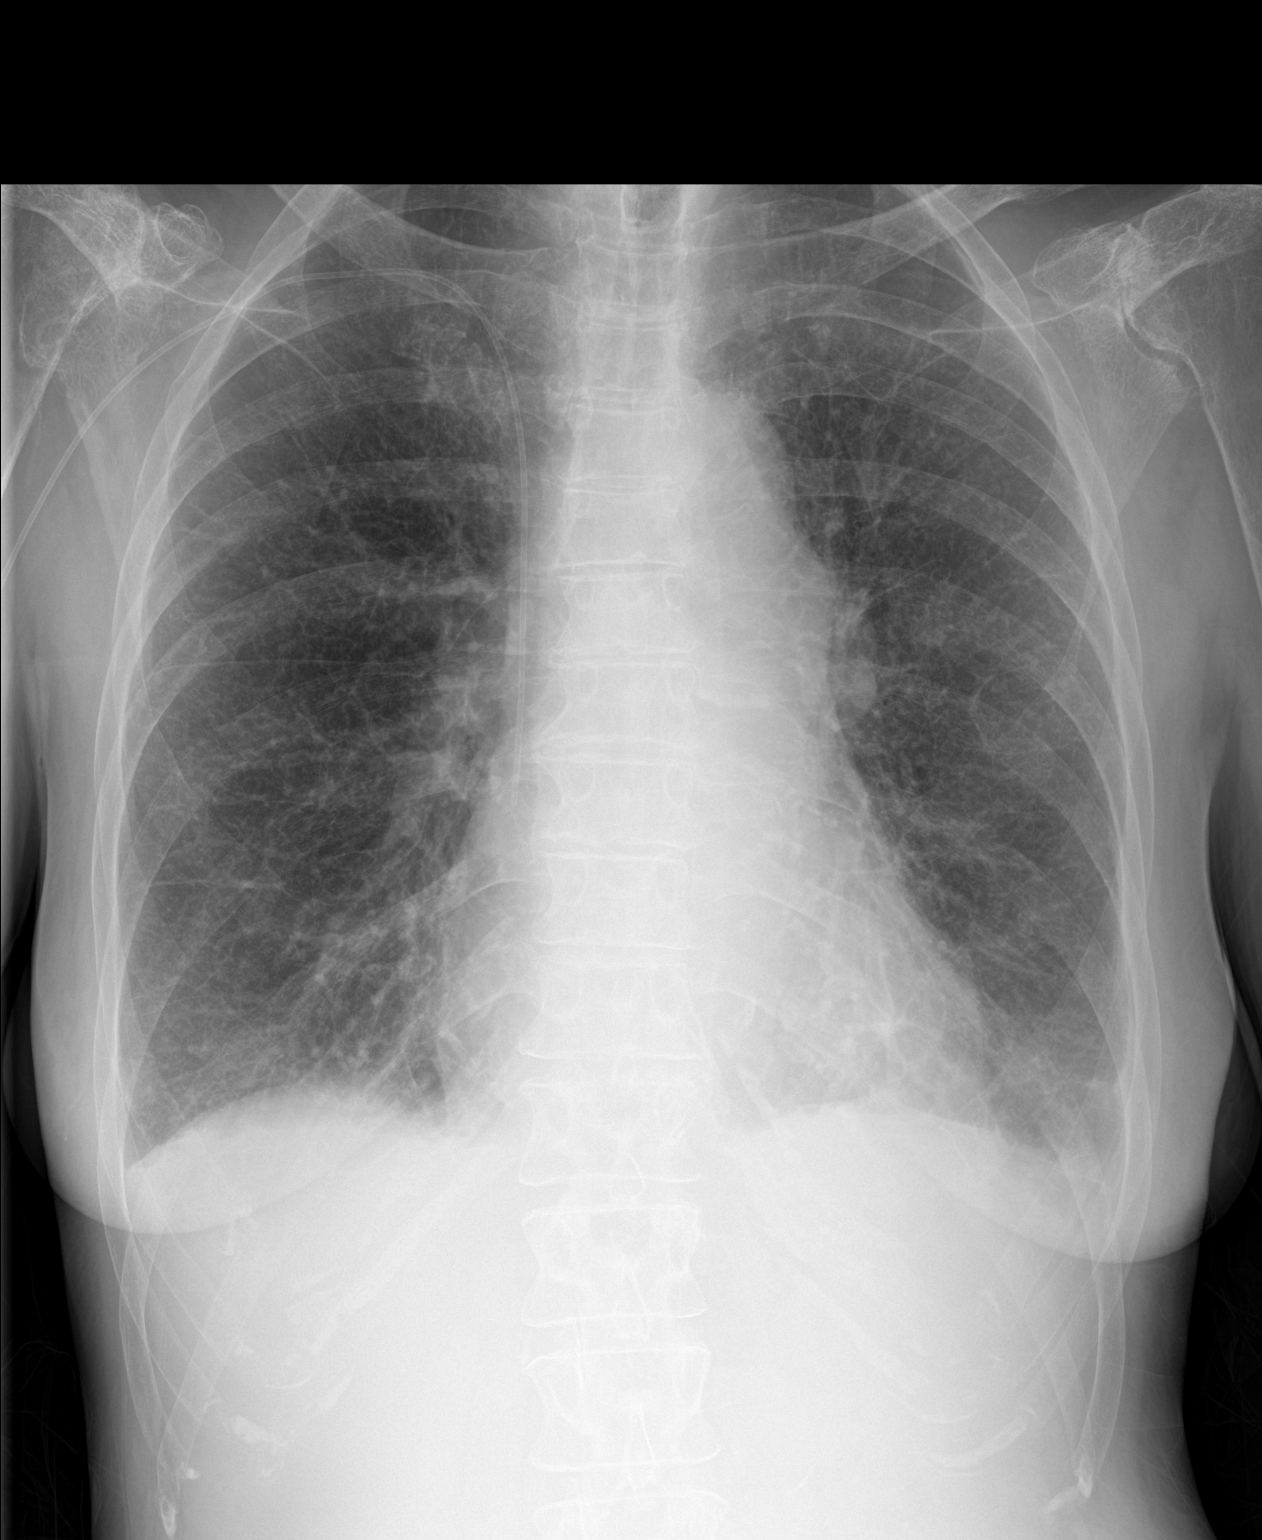

[1 of 1 positions shown; findings below may reference images not displayed]

FINDINGS: Borderline cardiomegaly. There is small left pleural effusion with
left basilar atelectasis or infiltrate. Right arm PICC line with tip
in SVC right atrium. No pneumothorax. No pulmonary edema.
Degenerative changes bilateral shoulders.
IMPRESSION: Right arm PICC line in place. No pneumothorax. Small left pleural
effusion with left basilar atelectasis or infiltrate. No pulmonary
edema.
